# Patient Record
Sex: Male | Born: 1980 | Race: White | Hispanic: No | Smoking: Former smoker
Health system: Southern US, Community
[De-identification: ages and names within clinical notes are randomized; demographics above are authoritative.]

## PROBLEM LIST (undated history)

## (undated) DIAGNOSIS — M109 Gout, unspecified: Secondary | ICD-10-CM

## (undated) DIAGNOSIS — Z8619 Personal history of other infectious and parasitic diseases: Secondary | ICD-10-CM

## (undated) DIAGNOSIS — IMO0002 Reserved for concepts with insufficient information to code with codable children: Secondary | ICD-10-CM

## (undated) DIAGNOSIS — F419 Anxiety disorder, unspecified: Secondary | ICD-10-CM

## (undated) DIAGNOSIS — F32A Depression, unspecified: Secondary | ICD-10-CM

## (undated) DIAGNOSIS — R51 Headache: Secondary | ICD-10-CM

## (undated) DIAGNOSIS — F329 Major depressive disorder, single episode, unspecified: Secondary | ICD-10-CM

## (undated) DIAGNOSIS — G43909 Migraine, unspecified, not intractable, without status migrainosus: Secondary | ICD-10-CM

## (undated) DIAGNOSIS — R519 Headache, unspecified: Secondary | ICD-10-CM

## (undated) HISTORY — DX: Headache: R51

## (undated) HISTORY — DX: Depression, unspecified: F32.A

## (undated) HISTORY — DX: Reserved for concepts with insufficient information to code with codable children: IMO0002

## (undated) HISTORY — DX: Gout, unspecified: M10.9

## (undated) HISTORY — DX: Migraine, unspecified, not intractable, without status migrainosus: G43.909

## (undated) HISTORY — DX: Headache, unspecified: R51.9

## (undated) HISTORY — DX: Personal history of other infectious and parasitic diseases: Z86.19

## (undated) HISTORY — DX: Anxiety disorder, unspecified: F41.9

## (undated) HISTORY — DX: Major depressive disorder, single episode, unspecified: F32.9

---
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
## 2001-11-08 HISTORY — PX: CYSTECTOMY: SUR359

## 2005-11-08 HISTORY — PX: TONSILLECTOMY AND ADENOIDECTOMY: SUR1326

## 2006-03-21 ENCOUNTER — Ambulatory Visit: Payer: Self-pay | Admitting: Unknown Physician Specialty

## 2014-05-21 ENCOUNTER — Encounter: Payer: Self-pay | Admitting: Family Medicine

## 2014-05-21 ENCOUNTER — Encounter (INDEPENDENT_AMBULATORY_CARE_PROVIDER_SITE_OTHER): Payer: Self-pay

## 2014-05-21 ENCOUNTER — Ambulatory Visit (INDEPENDENT_AMBULATORY_CARE_PROVIDER_SITE_OTHER)
Admission: RE | Admit: 2014-05-21 | Discharge: 2014-05-21 | Disposition: A | Discharge status: Home / Self Care | Payer: BC Managed Care – PPO | Source: Ambulatory Visit | Attending: Family Medicine | Admitting: Family Medicine

## 2014-05-21 ENCOUNTER — Ambulatory Visit (INDEPENDENT_AMBULATORY_CARE_PROVIDER_SITE_OTHER): Payer: BC Managed Care – PPO | Admitting: Family Medicine

## 2014-05-21 VITALS — BP 122/82 | HR 66 | Temp 97.8°F | Ht 67.0 in | Wt 234.5 lb

## 2014-05-21 DIAGNOSIS — M25579 Pain in unspecified ankle and joints of unspecified foot: Secondary | ICD-10-CM

## 2014-05-21 DIAGNOSIS — F329 Major depressive disorder, single episode, unspecified: Secondary | ICD-10-CM

## 2014-05-21 DIAGNOSIS — F3289 Other specified depressive episodes: Secondary | ICD-10-CM

## 2014-05-21 DIAGNOSIS — M25572 Pain in left ankle and joints of left foot: Secondary | ICD-10-CM

## 2014-05-21 DIAGNOSIS — F32A Depression, unspecified: Secondary | ICD-10-CM

## 2014-05-21 MED ORDER — ESCITALOPRAM OXALATE 20 MG PO TABS: 20.0000 mg | ORAL_TABLET | Freq: Every day | ORAL | Status: DC

## 2014-05-21 NOTE — Progress Notes (Signed)
Pre visit review using our clinic review tool, if applicable. No additional management support is needed unless otherwise documented below in the visit note.  New patient.  I had seen him years ago at another clinic, but not in 3+ years.  To est care here.  Requesting records from [facility].   L ankle and foot pain.  Stepped in a hole and inverted his ankle.  Felt a pop, but could bear weight.  RICE in the meantime.  Now more recently with pain and swelling, but then better somewhat again today.  No other trauma.    H/o depression. Controlled with meds and counseling, doing well.    PMH and SH reviewed  ROS: See HPI, otherwise noncontributory.  Meds, vitals, and allergies reviewed.   GEN: nad, alert and oriented HEENT: mucous membranes moist NECK: supple w/o LA CV: rrr.  no murmur PULM: ctab, no inc wob ABD: soft, +bs SKIN: no acute rash L foot puffy w/o bruising.  NV intact.  TTP inferior to the lat mal and on the dorsal portion of the proximal midfoot.  No weakness but pain with inversion.   5th MT not ttp

## 2014-05-21 NOTE — Patient Instructions (Signed)
Wear your ankle brace along with your boots.  Gradually wean out of them.   ROM exercises at night when not weight bearing.  Ice and elevation as needed.  Take care.

## 2014-05-22 ENCOUNTER — Encounter: Payer: Self-pay | Admitting: Family Medicine

## 2014-05-22 DIAGNOSIS — F329 Major depressive disorder, single episode, unspecified: Secondary | ICD-10-CM | POA: Insufficient documentation

## 2014-05-22 DIAGNOSIS — M25579 Pain in unspecified ankle and joints of unspecified foot: Secondary | ICD-10-CM | POA: Insufficient documentation

## 2014-05-22 NOTE — Assessment & Plan Note (Signed)
Likely sprain.  Felt better, more stable in an ASO.  He has lace up ankle brace at home.  Will use that with boots and f/u prn.  ROM exercises at night.  Possibility of  occult fx d/w pt, he is aware.  I read the films, no fx seen. He agrees with plan.  See instructions.

## 2014-05-22 NOTE — Assessment & Plan Note (Signed)
Controlled with meds and counseling, doing well.

## 2015-01-27 ENCOUNTER — Encounter: Payer: Self-pay | Admitting: Family Medicine

## 2015-01-27 ENCOUNTER — Ambulatory Visit (INDEPENDENT_AMBULATORY_CARE_PROVIDER_SITE_OTHER): Payer: BLUE CROSS/BLUE SHIELD | Admitting: Family Medicine

## 2015-01-27 VITALS — BP 110/82 | HR 72 | Temp 98.4°F | Wt 242.5 lb

## 2015-01-27 DIAGNOSIS — R739 Hyperglycemia, unspecified: Secondary | ICD-10-CM

## 2015-01-27 DIAGNOSIS — Z13220 Encounter for screening for lipoid disorders: Secondary | ICD-10-CM

## 2015-01-27 DIAGNOSIS — Z23 Encounter for immunization: Secondary | ICD-10-CM

## 2015-01-27 DIAGNOSIS — M25579 Pain in unspecified ankle and joints of unspecified foot: Secondary | ICD-10-CM

## 2015-01-27 DIAGNOSIS — F3342 Major depressive disorder, recurrent, in full remission: Secondary | ICD-10-CM

## 2015-01-27 DIAGNOSIS — Z131 Encounter for screening for diabetes mellitus: Secondary | ICD-10-CM

## 2015-01-27 DIAGNOSIS — E785 Hyperlipidemia, unspecified: Secondary | ICD-10-CM

## 2015-01-27 LAB — GLUCOSE, RANDOM: Glucose, Bld: 107 mg/dL — ABNORMAL HIGH (ref 70–99)

## 2015-01-27 LAB — LIPID PANEL
Cholesterol: 203 mg/dL — ABNORMAL HIGH (ref 0–200)
HDL: 36.9 mg/dL — ABNORMAL LOW (ref >39.00)
NonHDL: 166.1
Total CHOL/HDL Ratio: 6
Triglycerides: 231 mg/dL — ABNORMAL HIGH (ref 0.0–149.0)
VLDL: 46.2 mg/dL — ABNORMAL HIGH (ref 0.0–40.0)

## 2015-01-27 LAB — LDL CHOLESTEROL, DIRECT: Direct LDL: 138 mg/dL

## 2015-01-27 MED ORDER — LORAZEPAM 0.5 MG PO TABS: 0.5000 mg | ORAL_TABLET | Freq: Two times a day (BID) | ORAL | Status: DC | PRN

## 2015-01-27 MED ORDER — ESCITALOPRAM OXALATE 20 MG PO TABS: 20.0000 mg | ORAL_TABLET | Freq: Every day | ORAL | Status: DC

## 2015-01-27 NOTE — Patient Instructions (Addendum)
Try getting back on the bike gradually and keep stretching/icing your feet.  Go to the lab on the way out.  We'll contact you with your lab report. Take care.  Glad to see you.

## 2015-01-27 NOTE — Progress Notes (Signed)
Pre visit review using our clinic review tool, if applicable. No additional management support is needed unless otherwise documented below in the visit note.  Promoted at work, inc in stress noted.  He still has regular hours at work and that helps. Still on lexapro with prn BZD as needed, not frequently.  Mood is good overall, good effect from SSRI.  Dec in sex drive noted, but tolerable.  Sleeping well.  No SI/HI.    Exercise is limited, d/w pt, "viscous cycle" of weight and foot troubles = plantar fasciitis.  He can also get some dorsal arch pain.  Diet is still good, he isn't stress eating.  He has arch supports in his shoes.     Due for tdap and sugar/lipid screening.  D/w pt.   Meds, vitals, and allergies reviewed.   ROS: See HPI.  Otherwise, noncontributory.  nad ncat Neck supple. No LA rrr ctab abd soft Ext w/o edema B feet with normal inspection, normal DP pulse B, plantar fascia not ttp. No sig loss or arch B

## 2015-01-28 ENCOUNTER — Encounter: Payer: Self-pay | Admitting: Family Medicine

## 2015-01-28 DIAGNOSIS — R739 Hyperglycemia, unspecified: Secondary | ICD-10-CM | POA: Insufficient documentation

## 2015-01-28 DIAGNOSIS — E785 Hyperlipidemia, unspecified: Secondary | ICD-10-CM | POA: Insufficient documentation

## 2015-01-28 NOTE — Assessment & Plan Note (Signed)
See notes on labs.  D/w pt about diet and exercise.  

## 2015-01-28 NOTE — Assessment & Plan Note (Signed)
D/w pt about getting back on bike gradually and routinely stretching his feet.  He has good shoes and arch supports.  We can get a second opinion if needed.  He'll update me as needed.

## 2015-01-28 NOTE — Assessment & Plan Note (Signed)
See notes on labs.  D/w pt about diet and exercise.

## 2015-01-28 NOTE — Assessment & Plan Note (Addendum)
Controlled, continue as is.  No change in med.  He agrees.  Okay for outpatient fu.  >25 minutes spent in face to face time with patient, >50% spent in counselling or coordination of care.

## 2015-09-29 ENCOUNTER — Telehealth: Payer: Self-pay | Admitting: Family Medicine

## 2015-09-30 ENCOUNTER — Other Ambulatory Visit: Payer: Self-pay | Admitting: Family Medicine

## 2015-09-30 NOTE — Telephone Encounter (Signed)
Electronic refill request. Last Filled:    30 tablet 1 01/27/2015  Last office visit:  01/27/15       Please advise.

## 2015-10-01 NOTE — Telephone Encounter (Signed)
Labs 1/30 [facility] 2/2 Pt aware  Please close

## 2015-10-01 NOTE — Telephone Encounter (Signed)
Rx called to pharmacy as instructed.  Please schedule appointment as instructed.

## 2015-10-01 NOTE — Telephone Encounter (Signed)
Please call in.  Due for f/u in spring 2017.   Thanks.

## 2015-12-07 ENCOUNTER — Other Ambulatory Visit: Payer: Self-pay | Admitting: Family Medicine

## 2015-12-07 DIAGNOSIS — E785 Hyperlipidemia, unspecified: Secondary | ICD-10-CM

## 2015-12-08 ENCOUNTER — Other Ambulatory Visit (INDEPENDENT_AMBULATORY_CARE_PROVIDER_SITE_OTHER): Payer: BLUE CROSS/BLUE SHIELD

## 2015-12-08 DIAGNOSIS — E785 Hyperlipidemia, unspecified: Secondary | ICD-10-CM | POA: Diagnosis not present

## 2015-12-08 LAB — LIPID PANEL
Cholesterol: 173 mg/dL (ref 0–200)
HDL: 38.1 mg/dL — ABNORMAL LOW (ref >39.00)
NonHDL: 134.53
Total CHOL/HDL Ratio: 5
Triglycerides: 211 mg/dL — ABNORMAL HIGH (ref 0.0–149.0)
VLDL: 42.2 mg/dL — ABNORMAL HIGH (ref 0.0–40.0)

## 2015-12-08 LAB — BASIC METABOLIC PANEL
BUN: 13 mg/dL (ref 6–23)
CO2: 28 mEq/L (ref 19–32)
Calcium: 9.1 mg/dL (ref 8.4–10.5)
Chloride: 104 mEq/L (ref 96–112)
Creatinine, Ser: 1.02 mg/dL (ref 0.40–1.50)
GFR: 87.86 mL/min (ref >60.00)
Glucose, Bld: 105 mg/dL — ABNORMAL HIGH (ref 70–99)
Potassium: 4.4 mEq/L (ref 3.5–5.1)
Sodium: 140 mEq/L (ref 135–145)

## 2015-12-08 LAB — LDL CHOLESTEROL, DIRECT: Direct LDL: 107 mg/dL

## 2015-12-11 ENCOUNTER — Encounter: Payer: BLUE CROSS/BLUE SHIELD | Admitting: Family Medicine

## 2015-12-12 ENCOUNTER — Encounter: Payer: Self-pay | Admitting: Family Medicine

## 2015-12-12 ENCOUNTER — Ambulatory Visit (INDEPENDENT_AMBULATORY_CARE_PROVIDER_SITE_OTHER): Payer: BLUE CROSS/BLUE SHIELD | Admitting: Family Medicine

## 2015-12-12 VITALS — BP 126/88 | HR 65 | Temp 97.7°F | Ht 67.0 in | Wt 237.2 lb

## 2015-12-12 DIAGNOSIS — Z0000 Encounter for general adult medical examination without abnormal findings: Secondary | ICD-10-CM

## 2015-12-12 DIAGNOSIS — Z7189 Other specified counseling: Secondary | ICD-10-CM

## 2015-12-12 DIAGNOSIS — F339 Major depressive disorder, recurrent, unspecified: Secondary | ICD-10-CM

## 2015-12-12 MED ORDER — ESCITALOPRAM OXALATE 20 MG PO TABS: 20.0000 mg | ORAL_TABLET | Freq: Every day | ORAL | Status: DC

## 2015-12-12 NOTE — Progress Notes (Signed)
Pre visit review using our clinic review tool, if applicable. No additional management support is needed unless otherwise documented below in the visit note.  CPE- See plan.  Routine anticipatory guidance given to patient.  See health maintenance. Tetanus 2016 Flu shot encouraged.  He has some URI sx and wanted to wait a few days.   PNA and shingles not due.   Colon and prostate cancer not due.   Living will d/w pt.  Brandon Jackson designated if patient were incapacitated.   Diet and exercise d/w pt. Doing well on diet, less well on exercise, encouraged.  He is walking some.   Work is busy.   HIV prev done ~2008.    He wanted to go back to counseling.  D/w pt.  No SI/HI.  Still on lexapro.  Chronic work and life stressors note.    PMH and SH reviewed  Meds, vitals, and allergies reviewed.   ROS: See HPI.  Otherwise negative.    GEN: nad, alert and oriented HEENT: mucous membranes moist NECK: supple w/o LA CV: rrr. PULM: ctab, no inc wob ABD: soft, +bs EXT: no edema SKIN: no acute rash

## 2015-12-12 NOTE — Patient Instructions (Signed)
Don't change your meds.  Check on counseling.  Let me know if you need a referral or have trouble picking one out.  Take care.  Glad to see you.

## 2015-12-14 DIAGNOSIS — Z0000 Encounter for general adult medical examination without abnormal findings: Secondary | ICD-10-CM | POA: Insufficient documentation

## 2015-12-14 DIAGNOSIS — Z7189 Other specified counseling: Secondary | ICD-10-CM | POA: Insufficient documentation

## 2015-12-14 NOTE — Assessment & Plan Note (Signed)
He'll get back in counseling.  He'll check on list of in network counselors and update me as needed, if he can't decide who to see.  Okay for outpatient f/u.  No change in meds.

## 2015-12-14 NOTE — Assessment & Plan Note (Addendum)
Tetanus 2016  Flu shot encouraged. He has some URI sx and wanted to wait a few days.  PNA and shingles not due.  Colon and prostate cancer not due.  Living will d/w pt. Brandon Jackson designated if patient were incapacitated.  Diet and exercise d/w pt. Doing well on diet, less well on exercise, encouraged. He is walking some.  Work is busy.  HIV prev done ~2008.  He'll work on sugar and lipids with diet and exercise, but addressing mood is likely a bigger deal for patient now, see below.   Labs d/w pt.

## 2016-06-10 DIAGNOSIS — M4607 Spinal enthesopathy, lumbosacral region: Secondary | ICD-10-CM | POA: Diagnosis not present

## 2016-06-10 DIAGNOSIS — M9903 Segmental and somatic dysfunction of lumbar region: Secondary | ICD-10-CM | POA: Diagnosis not present

## 2016-06-10 DIAGNOSIS — M9904 Segmental and somatic dysfunction of sacral region: Secondary | ICD-10-CM | POA: Diagnosis not present

## 2016-06-10 DIAGNOSIS — M62830 Muscle spasm of back: Secondary | ICD-10-CM | POA: Diagnosis not present

## 2016-06-17 DIAGNOSIS — M4607 Spinal enthesopathy, lumbosacral region: Secondary | ICD-10-CM | POA: Diagnosis not present

## 2016-06-17 DIAGNOSIS — M9903 Segmental and somatic dysfunction of lumbar region: Secondary | ICD-10-CM | POA: Diagnosis not present

## 2016-06-17 DIAGNOSIS — M62830 Muscle spasm of back: Secondary | ICD-10-CM | POA: Diagnosis not present

## 2016-06-17 DIAGNOSIS — M9904 Segmental and somatic dysfunction of sacral region: Secondary | ICD-10-CM | POA: Diagnosis not present

## 2016-06-24 ENCOUNTER — Encounter: Payer: Self-pay | Admitting: Family Medicine

## 2016-06-24 ENCOUNTER — Other Ambulatory Visit: Payer: Self-pay | Admitting: *Deleted

## 2016-06-24 MED ORDER — LORAZEPAM 0.5 MG PO TABS: 0.5000 mg | ORAL_TABLET | Freq: Two times a day (BID) | ORAL | 2 refills | Status: DC | PRN

## 2016-06-24 NOTE — Telephone Encounter (Signed)
MyChart RF request.  Last Filled:     30 tablet 2 10/01/2015  Last office visit:   12/12/2015   Please advise.

## 2016-06-24 NOTE — Telephone Encounter (Signed)
Please call in.  Thanks.   

## 2016-06-25 ENCOUNTER — Other Ambulatory Visit: Payer: Self-pay | Admitting: Family Medicine

## 2016-06-25 NOTE — Telephone Encounter (Signed)
Medication phoned to pharmacy.  

## 2016-08-18 DIAGNOSIS — H5213 Myopia, bilateral: Secondary | ICD-10-CM | POA: Diagnosis not present

## 2016-12-11 ENCOUNTER — Other Ambulatory Visit: Payer: Self-pay | Admitting: Family Medicine

## 2017-01-18 ENCOUNTER — Encounter: Payer: BLUE CROSS/BLUE SHIELD | Admitting: Family Medicine

## 2017-01-24 ENCOUNTER — Ambulatory Visit (INDEPENDENT_AMBULATORY_CARE_PROVIDER_SITE_OTHER): Payer: BLUE CROSS/BLUE SHIELD | Admitting: Family Medicine

## 2017-01-24 ENCOUNTER — Encounter: Payer: Self-pay | Admitting: Family Medicine

## 2017-01-24 VITALS — BP 120/84 | HR 60 | Temp 98.6°F | Ht 67.0 in | Wt 236.0 lb

## 2017-01-24 DIAGNOSIS — Z0000 Encounter for general adult medical examination without abnormal findings: Secondary | ICD-10-CM

## 2017-01-24 DIAGNOSIS — E785 Hyperlipidemia, unspecified: Secondary | ICD-10-CM | POA: Diagnosis not present

## 2017-01-24 DIAGNOSIS — F339 Major depressive disorder, recurrent, unspecified: Secondary | ICD-10-CM

## 2017-01-24 LAB — BASIC METABOLIC PANEL
BUN: 14 mg/dL (ref 6–23)
CO2: 30 mEq/L (ref 19–32)
Calcium: 9.9 mg/dL (ref 8.4–10.5)
Chloride: 102 mEq/L (ref 96–112)
Creatinine, Ser: 0.97 mg/dL (ref 0.40–1.50)
GFR: 92.53 mL/min (ref >60.00)
Glucose, Bld: 96 mg/dL (ref 70–99)
Potassium: 4.2 mEq/L (ref 3.5–5.1)
Sodium: 140 mEq/L (ref 135–145)

## 2017-01-24 LAB — LIPID PANEL
Cholesterol: 188 mg/dL (ref 0–200)
HDL: 43.8 mg/dL (ref >39.00)
LDL Cholesterol: 113 mg/dL — ABNORMAL HIGH (ref 0–99)
NonHDL: 143.92
Total CHOL/HDL Ratio: 4
Triglycerides: 156 mg/dL — ABNORMAL HIGH (ref 0.0–149.0)
VLDL: 31.2 mg/dL (ref 0.0–40.0)

## 2017-01-24 MED ORDER — ESCITALOPRAM OXALATE 20 MG PO TABS: 20.0000 mg | ORAL_TABLET | Freq: Every day | ORAL | 12 refills | Status: DC

## 2017-01-24 MED ORDER — LORAZEPAM 0.5 MG PO TABS: 0.5000 mg | ORAL_TABLET | Freq: Two times a day (BID) | ORAL | 2 refills | Status: DC | PRN

## 2017-01-24 NOTE — Progress Notes (Signed)
CPE- See plan.  Routine anticipatory guidance given to patient.  See health maintenance.  The possibility exists that previously documented standard health maintenance information may have been brought forward from a previous encounter into this note.  If needed, that same information has been updated to reflect the current situation based on today's encounter.    Tetanus 2016  Flu shot encouraged, d/w pt.   PNA and shingles not due.  Colon and prostate cancer not due.  Living will d/w pt. Brandon Jackson designated if patient were incapacitated.  Diet and exercise d/w pt. "Not great."  He cut back on carbs some in the meantime.  He cut out fast food.  Exercise is lagging.  D/w pt about options.   Work is busy. D/w pt.   HIV prev done ~2008.  Labs pending. D/w pt.    Mood is not good per patient report.  He had a worse winter, his sx are worse in the wintertime at baseline, but this was more pronounced.  More irritable.  No SI/HI.  He prev went to counseling in the past and wanted to get started with counseling again.  It was prev helpful.  Still on lexapro.  Rare use of BZD.    Father with h/o likely IBS.  Patient now with similar.  Seems to be w/o clear food trigger.  Diarrheal tendency.  No blood in stool.    PMH and SH reviewed  Meds, vitals, and allergies reviewed.   ROS: Per HPI.  Unless specifically indicated otherwise in HPI, the patient denies:  General: fever. Eyes: acute vision changes ENT: sore throat Cardiovascular: chest pain Respiratory: SOB GI: vomiting GU: dysuria Musculoskeletal: acute back pain Derm: acute rash Neuro: acute motor dysfunction Psych: worsening mood Endocrine: polydipsia Heme: bleeding Allergy: hayfever  GEN: nad, alert and oriented HEENT: mucous membranes moist NECK: supple w/o LA CV: rrr. PULM: ctab, no inc wob ABD: soft, +bs EXT: no edema SKIN: no acute rash

## 2017-01-24 NOTE — Progress Notes (Signed)
Pre visit review using our clinic review tool, if applicable. No additional management support is needed unless otherwise documented below in the visit note. 

## 2017-01-24 NOTE — Patient Instructions (Addendum)
Go to the lab on the way out.  We'll contact you with your lab report. Check on the way out about your referral. Don't change your meds for now.   Take care.  Glad to see you.  Update me as needed.

## 2017-01-25 ENCOUNTER — Encounter: Payer: Self-pay | Admitting: *Deleted

## 2017-01-25 NOTE — Assessment & Plan Note (Signed)
Tetanus 2016  Flu shot encouraged, d/w pt.   PNA and shingles not due.  Colon and prostate cancer not due.  Living will d/w pt. Brandon Jackson designated if patient were incapacitated.  Diet and exercise d/w pt. "Not great."  He cut back on carbs some in the meantime.  He cut out fast food.  Exercise is lagging.  D/w pt about options.   Work is busy. D/w pt.   HIV prev done ~2008.  Labs pending. D/w pt.

## 2017-01-25 NOTE — Assessment & Plan Note (Addendum)
Continue current medication. No suicidal or homicidal intent. Okay for outpatient follow-up. Refer for counseling. I think this would be beneficial. He agrees. He will update me if his mood is worse. He contracts for safety.  It may be the case that he has IBS or at least IBS-like symptoms. In the absence of any red flag symptoms, and he has no red flag symptoms, it would be likely most appropriate to treat his mood first and see what symptoms remain after that. He agrees.  Discussed with patient about diet and exercise in general. He agrees.

## 2017-02-16 ENCOUNTER — Encounter: Payer: Self-pay | Admitting: Family Medicine

## 2017-03-16 ENCOUNTER — Ambulatory Visit (INDEPENDENT_AMBULATORY_CARE_PROVIDER_SITE_OTHER): Payer: BLUE CROSS/BLUE SHIELD | Admitting: Psychology

## 2017-03-16 DIAGNOSIS — F331 Major depressive disorder, recurrent, moderate: Secondary | ICD-10-CM | POA: Diagnosis not present

## 2017-03-16 DIAGNOSIS — F411 Generalized anxiety disorder: Secondary | ICD-10-CM

## 2017-03-31 ENCOUNTER — Ambulatory Visit: Payer: BLUE CROSS/BLUE SHIELD | Admitting: Psychology

## 2017-03-31 ENCOUNTER — Ambulatory Visit (INDEPENDENT_AMBULATORY_CARE_PROVIDER_SITE_OTHER): Payer: BLUE CROSS/BLUE SHIELD | Admitting: Psychology

## 2017-03-31 DIAGNOSIS — F331 Major depressive disorder, recurrent, moderate: Secondary | ICD-10-CM

## 2017-03-31 DIAGNOSIS — F411 Generalized anxiety disorder: Secondary | ICD-10-CM | POA: Diagnosis not present

## 2017-04-06 ENCOUNTER — Ambulatory Visit (INDEPENDENT_AMBULATORY_CARE_PROVIDER_SITE_OTHER): Payer: BLUE CROSS/BLUE SHIELD | Admitting: Psychology

## 2017-04-06 DIAGNOSIS — F411 Generalized anxiety disorder: Secondary | ICD-10-CM | POA: Diagnosis not present

## 2017-04-06 DIAGNOSIS — F331 Major depressive disorder, recurrent, moderate: Secondary | ICD-10-CM

## 2017-04-15 ENCOUNTER — Ambulatory Visit (INDEPENDENT_AMBULATORY_CARE_PROVIDER_SITE_OTHER): Payer: BLUE CROSS/BLUE SHIELD | Admitting: Psychology

## 2017-04-15 DIAGNOSIS — F331 Major depressive disorder, recurrent, moderate: Secondary | ICD-10-CM | POA: Diagnosis not present

## 2017-04-15 DIAGNOSIS — F411 Generalized anxiety disorder: Secondary | ICD-10-CM

## 2017-04-25 ENCOUNTER — Ambulatory Visit (INDEPENDENT_AMBULATORY_CARE_PROVIDER_SITE_OTHER): Payer: BLUE CROSS/BLUE SHIELD | Admitting: Psychology

## 2017-04-25 DIAGNOSIS — F411 Generalized anxiety disorder: Secondary | ICD-10-CM | POA: Diagnosis not present

## 2017-04-25 DIAGNOSIS — F331 Major depressive disorder, recurrent, moderate: Secondary | ICD-10-CM | POA: Diagnosis not present

## 2017-09-27 ENCOUNTER — Ambulatory Visit (INDEPENDENT_AMBULATORY_CARE_PROVIDER_SITE_OTHER): Payer: BLUE CROSS/BLUE SHIELD | Admitting: Psychology

## 2017-09-27 DIAGNOSIS — F411 Generalized anxiety disorder: Secondary | ICD-10-CM

## 2017-09-27 DIAGNOSIS — F331 Major depressive disorder, recurrent, moderate: Secondary | ICD-10-CM

## 2017-09-28 ENCOUNTER — Encounter: Payer: Self-pay | Admitting: Family Medicine

## 2017-09-28 ENCOUNTER — Ambulatory Visit: Payer: BLUE CROSS/BLUE SHIELD | Admitting: Family Medicine

## 2017-09-28 ENCOUNTER — Telehealth: Payer: Self-pay

## 2017-09-28 VITALS — BP 118/74 | Temp 98.3°F | Ht 67.0 in | Wt 243.0 lb

## 2017-09-28 DIAGNOSIS — N452 Orchitis: Secondary | ICD-10-CM

## 2017-09-28 MED ORDER — DOXYCYCLINE HYCLATE 100 MG PO CAPS: 100.0000 mg | ORAL_CAPSULE | Freq: Two times a day (BID) | ORAL | 0 refills | Status: AC

## 2017-09-28 NOTE — Patient Instructions (Signed)
WE NOW OFFER    [facility] FAST TRACK!!!  SAME DAY Appointments for ACUTE CARE  Such as: Sprains, Injuries, cuts, abrasions, rashes, muscle pain, joint pain, back pain Colds, flu, sore throats, headache, allergies, cough, fever  Ear pain, sinus and eye infections Abdominal pain, nausea, vomiting, diarrhea, upset stomach Animal/insect bites  3 Easy Ways to Schedule: Walk-In Scheduling Call in scheduling Mychart Sign-up: [email or web address]         

## 2017-09-28 NOTE — Telephone Encounter (Signed)
On 09/27/17 pt was in sitting position and pulled out a file from filing cabinet and had immediate pain in rt lower abd; shortly after pt was aware pain was in rt groin, testicle area and pain at times would go down rt leg; pt felt like he had been kicked.Now pain with sitting or standing; less pain laying down. Now pain level 6 and is constant and dull achy pain. [facility] or [facility] and pt prefers not to go to ED. Pt said he can safely drive. Pt scheduled appt with Dr Clent RidgesFry today at 11:15 and if pt worsens prior to appt will go to ED. FYI to Dr Clent RidgesFry.

## 2017-09-28 NOTE — Progress Notes (Signed)
   Subjective:    Patient ID: Sallyanne HaversMichael D Drzewiecki, male    DOB: [birth year], 36 y.o.   MRN: [id number]  HPI Here for 2 days of pain around the right testicle. No recent trauma. It started slowly and then got worse over the past 2 days. No fever. No UTI symptoms. No penile DC.    Review of Systems  Constitutional: Negative.   Respiratory: Negative.   Cardiovascular: Negative.   Gastrointestinal: Negative.   Genitourinary: Positive for testicular pain. Negative for difficulty urinating, discharge, dysuria, flank pain, frequency, genital sores, hematuria, scrotal swelling and urgency.       Objective:   Physical Exam  Constitutional: He appears well-developed and well-nourished.  Cardiovascular: Normal rate, regular rhythm, normal heart sounds and intact distal pulses.  Pulmonary/Chest: Effort normal and breath sounds normal. No respiratory distress. He has no wheezes. He has no rales.  Abdominal: Soft. Bowel sounds are normal. He exhibits no distension and no mass. There is no tenderness. There is no rebound and no guarding.  No hernias felt   Genitourinary:  Genitourinary Comments: The right testicle is slightly swollen and very tender. No masses or hernias felt. The left side is normal.           Assessment & Plan:  Orchitis, treat with Doxycycline. Use Ibuprofen and sit in a warm bath prn.  Gershon CraneStephen Fry, MD

## 2017-10-11 ENCOUNTER — Ambulatory Visit: Payer: BLUE CROSS/BLUE SHIELD | Admitting: Psychology

## 2017-10-11 DIAGNOSIS — F411 Generalized anxiety disorder: Secondary | ICD-10-CM | POA: Diagnosis not present

## 2017-10-11 DIAGNOSIS — F331 Major depressive disorder, recurrent, moderate: Secondary | ICD-10-CM | POA: Diagnosis not present

## 2017-11-09 ENCOUNTER — Ambulatory Visit (INDEPENDENT_AMBULATORY_CARE_PROVIDER_SITE_OTHER): Payer: BLUE CROSS/BLUE SHIELD | Admitting: Psychology

## 2017-11-09 DIAGNOSIS — F411 Generalized anxiety disorder: Secondary | ICD-10-CM

## 2017-11-22 ENCOUNTER — Encounter: Payer: Self-pay | Admitting: Family Medicine

## 2017-11-23 ENCOUNTER — Ambulatory Visit (INDEPENDENT_AMBULATORY_CARE_PROVIDER_SITE_OTHER): Payer: BLUE CROSS/BLUE SHIELD | Admitting: Psychology

## 2017-11-23 DIAGNOSIS — F411 Generalized anxiety disorder: Secondary | ICD-10-CM

## 2017-11-23 DIAGNOSIS — F331 Major depressive disorder, recurrent, moderate: Secondary | ICD-10-CM

## 2017-11-23 MED ORDER — LORAZEPAM 0.5 MG PO TABS: 0.5000 mg | ORAL_TABLET | Freq: Two times a day (BID) | ORAL | 2 refills | Status: DC | PRN

## 2017-11-23 NOTE — Telephone Encounter (Signed)
Message sent to patient thru mychart to call the office and schedule CPE in springtime per Dr. Para Marchuncan.

## 2017-11-23 NOTE — Telephone Encounter (Signed)
rx sent.  Reasonable for CPE in the springtime.  Thanks.

## 2017-12-07 ENCOUNTER — Ambulatory Visit (INDEPENDENT_AMBULATORY_CARE_PROVIDER_SITE_OTHER): Payer: BLUE CROSS/BLUE SHIELD | Admitting: Psychology

## 2017-12-07 DIAGNOSIS — F411 Generalized anxiety disorder: Secondary | ICD-10-CM | POA: Diagnosis not present

## 2017-12-21 ENCOUNTER — Ambulatory Visit (INDEPENDENT_AMBULATORY_CARE_PROVIDER_SITE_OTHER): Payer: BLUE CROSS/BLUE SHIELD | Admitting: Psychology

## 2017-12-21 DIAGNOSIS — F411 Generalized anxiety disorder: Secondary | ICD-10-CM | POA: Diagnosis not present

## 2018-01-04 ENCOUNTER — Ambulatory Visit (INDEPENDENT_AMBULATORY_CARE_PROVIDER_SITE_OTHER): Payer: BLUE CROSS/BLUE SHIELD | Admitting: Psychology

## 2018-01-04 DIAGNOSIS — F411 Generalized anxiety disorder: Secondary | ICD-10-CM

## 2018-01-04 DIAGNOSIS — F331 Major depressive disorder, recurrent, moderate: Secondary | ICD-10-CM

## 2018-01-18 ENCOUNTER — Ambulatory Visit (INDEPENDENT_AMBULATORY_CARE_PROVIDER_SITE_OTHER): Payer: BLUE CROSS/BLUE SHIELD | Admitting: Psychology

## 2018-01-18 DIAGNOSIS — F411 Generalized anxiety disorder: Secondary | ICD-10-CM | POA: Diagnosis not present

## 2018-01-18 DIAGNOSIS — F331 Major depressive disorder, recurrent, moderate: Secondary | ICD-10-CM

## 2018-01-24 ENCOUNTER — Other Ambulatory Visit: Payer: Self-pay | Admitting: Family Medicine

## 2018-01-24 ENCOUNTER — Other Ambulatory Visit: Payer: BLUE CROSS/BLUE SHIELD

## 2018-01-24 ENCOUNTER — Other Ambulatory Visit (INDEPENDENT_AMBULATORY_CARE_PROVIDER_SITE_OTHER): Payer: BLUE CROSS/BLUE SHIELD

## 2018-01-24 DIAGNOSIS — E785 Hyperlipidemia, unspecified: Secondary | ICD-10-CM | POA: Diagnosis not present

## 2018-01-24 LAB — LIPID PANEL
Cholesterol: 171 mg/dL (ref 0–200)
HDL: 37 mg/dL — ABNORMAL LOW (ref >39.00)
LDL Cholesterol: 107 mg/dL — ABNORMAL HIGH (ref 0–99)
NonHDL: 133.87
Total CHOL/HDL Ratio: 5
Triglycerides: 134 mg/dL (ref 0.0–149.0)
VLDL: 26.8 mg/dL (ref 0.0–40.0)

## 2018-01-24 LAB — BASIC METABOLIC PANEL
BUN: 15 mg/dL (ref 6–23)
CO2: 25 mEq/L (ref 19–32)
Calcium: 9.4 mg/dL (ref 8.4–10.5)
Chloride: 102 mEq/L (ref 96–112)
Creatinine, Ser: 0.98 mg/dL (ref 0.40–1.50)
GFR: 90.95 mL/min (ref >60.00)
Glucose, Bld: 107 mg/dL — ABNORMAL HIGH (ref 70–99)
Potassium: 4.1 mEq/L (ref 3.5–5.1)
Sodium: 136 mEq/L (ref 135–145)

## 2018-01-26 ENCOUNTER — Ambulatory Visit (INDEPENDENT_AMBULATORY_CARE_PROVIDER_SITE_OTHER): Payer: BLUE CROSS/BLUE SHIELD | Admitting: Family Medicine

## 2018-01-26 ENCOUNTER — Encounter: Payer: Self-pay | Admitting: Family Medicine

## 2018-01-26 VITALS — BP 110/86 | HR 77 | Temp 98.3°F | Ht 67.0 in | Wt 249.5 lb

## 2018-01-26 DIAGNOSIS — Z0000 Encounter for general adult medical examination without abnormal findings: Secondary | ICD-10-CM

## 2018-01-26 DIAGNOSIS — R739 Hyperglycemia, unspecified: Secondary | ICD-10-CM

## 2018-01-26 DIAGNOSIS — Z7189 Other specified counseling: Secondary | ICD-10-CM

## 2018-01-26 DIAGNOSIS — E785 Hyperlipidemia, unspecified: Secondary | ICD-10-CM

## 2018-01-26 DIAGNOSIS — N529 Male erectile dysfunction, unspecified: Secondary | ICD-10-CM

## 2018-01-26 DIAGNOSIS — F339 Major depressive disorder, recurrent, unspecified: Secondary | ICD-10-CM

## 2018-01-26 MED ORDER — ESCITALOPRAM OXALATE 20 MG PO TABS: 20.0000 mg | ORAL_TABLET | Freq: Every day | ORAL | 12 refills | Status: DC

## 2018-01-26 MED ORDER — SILDENAFIL CITRATE 20 MG PO TABS: 60.0000 mg | ORAL_TABLET | Freq: Every day | ORAL | 12 refills | Status: DC | PRN

## 2018-01-26 NOTE — Progress Notes (Signed)
CPE- See plan.  Routine anticipatory guidance given to patient.  See health maintenance.  The possibility exists that previously documented standard health maintenance information may have been brought forward from a previous encounter into this note.  If needed, that same information has been updated to reflect the current situation based on today's encounter.    Tetanus 2016  Flu shot done 2018.  PNA and shingles not due.  Colon and prostate cancer not due.  Living will d/w pt.  Brandon Jackson designated if patient were incapacitated.  Diet and exercise d/w pt. Still "not great."  His diet is healthy, but portion control was an issue.  He cut out fast food.  Exercise is lagging.  D/w pt about options, walking.   Work is busy. D/w pt about work/life balance.  HIV prev done ~2008.  Labs d/w pt.  Mild hyperglycemia noted.   He cut out diet soda and his HA got better.    He is back in counseling re: his mood.  He is busy with work.  He had a friend from college who committed suicide in the fall so he restarted with counseling.  Still on baseline meds w/o ADE except for ED noted.  No SI/HI.  Taking BZD as needed, up to a few doses per week, often less.    Routine ED tx d/w pt.  All questions answered.  See plan and AVS.   He stress eats and that affects his weight.  He feels some guilt with eating, discussed.  He is back to trying to regulate his meals, getting breakfast, etc.   PMH and SH reviewed  Meds, vitals, and allergies reviewed.   ROS: Per HPI.  Unless specifically indicated otherwise in HPI, the patient denies:  General: fever. Eyes: acute vision changes ENT: sore throat Cardiovascular: chest pain Respiratory: SOB GI: vomiting GU: dysuria Musculoskeletal: acute back pain Derm: acute rash Neuro: acute motor dysfunction Psych: worsening mood Endocrine: polydipsia Heme: bleeding Allergy: hayfever  GEN: nad, alert and oriented HEENT: mucous membranes moist NECK: supple w/o  LA CV: rrr. PULM: ctab, no inc wob ABD: soft, +bs EXT: no edema SKIN: no acute rash

## 2018-01-26 NOTE — Patient Instructions (Signed)
Thanks for getting a flu shot.  Keep working with counseling.   Use sildenafil if needed.  We may need to taper your lexapro or cut the dose.  Take care.  Glad to see you.  Update me as needed.

## 2018-01-27 DIAGNOSIS — N529 Male erectile dysfunction, unspecified: Secondary | ICD-10-CM | POA: Insufficient documentation

## 2018-01-27 NOTE — Assessment & Plan Note (Signed)
Discussed with patient about labs, diet, exercise. 

## 2018-01-27 NOTE — Assessment & Plan Note (Signed)
Tetanus 2016  Flu shot done 2018.  PNA and shingles not due.  Colon and prostate cancer not due.  Living will d/w pt.  Brandon Jackson designated if patient were incapacitated.  Diet and exercise d/w pt. Still "not great."  His diet is healthy, but portion control was an issue.  He cut out fast food.  Exercise is lagging.  D/w pt about options, walking.   Work is busy. D/w pt about work/life balance.  HIV prev done ~2008.  Labs d/w pt.  Mild hyperglycemia noted.

## 2018-01-27 NOTE — Assessment & Plan Note (Signed)
Continue current medications.  Continue with counseling.  I want him to talk about his relationship with food at counseling.  He agrees to do this.  He may have erectile dysfunction related to SSRI use.  At this point it is likely the benefit of SSRI use outweighs any potential adverse events.  Continue as is for now but we may need to taper this in the future if his erectile dysfunction continues.  Discussed with patient.  He agrees.  Okay for outpatient follow-up.

## 2018-01-27 NOTE — Assessment & Plan Note (Signed)
Living will d/w pt. Brandon Jackson designated if patient were incapacitated.

## 2018-01-27 NOTE — Assessment & Plan Note (Signed)
Reasonable to try sildenafil with routine cautions given.  See medication list.  All questions answered.  He agrees.

## 2018-02-01 ENCOUNTER — Ambulatory Visit (INDEPENDENT_AMBULATORY_CARE_PROVIDER_SITE_OTHER): Payer: BLUE CROSS/BLUE SHIELD | Admitting: Psychology

## 2018-02-01 DIAGNOSIS — F331 Major depressive disorder, recurrent, moderate: Secondary | ICD-10-CM

## 2018-02-01 DIAGNOSIS — F411 Generalized anxiety disorder: Secondary | ICD-10-CM

## 2018-02-15 ENCOUNTER — Ambulatory Visit (INDEPENDENT_AMBULATORY_CARE_PROVIDER_SITE_OTHER): Payer: BLUE CROSS/BLUE SHIELD | Admitting: Psychology

## 2018-02-15 DIAGNOSIS — F331 Major depressive disorder, recurrent, moderate: Secondary | ICD-10-CM | POA: Diagnosis not present

## 2018-02-15 DIAGNOSIS — F411 Generalized anxiety disorder: Secondary | ICD-10-CM

## 2018-03-01 ENCOUNTER — Ambulatory Visit (INDEPENDENT_AMBULATORY_CARE_PROVIDER_SITE_OTHER): Payer: BLUE CROSS/BLUE SHIELD | Admitting: Psychology

## 2018-03-01 DIAGNOSIS — F331 Major depressive disorder, recurrent, moderate: Secondary | ICD-10-CM

## 2018-03-01 DIAGNOSIS — F411 Generalized anxiety disorder: Secondary | ICD-10-CM | POA: Diagnosis not present

## 2018-03-13 ENCOUNTER — Ambulatory Visit (INDEPENDENT_AMBULATORY_CARE_PROVIDER_SITE_OTHER): Payer: BLUE CROSS/BLUE SHIELD | Admitting: Psychology

## 2018-03-13 DIAGNOSIS — F331 Major depressive disorder, recurrent, moderate: Secondary | ICD-10-CM | POA: Diagnosis not present

## 2018-03-13 DIAGNOSIS — F411 Generalized anxiety disorder: Secondary | ICD-10-CM | POA: Diagnosis not present

## 2018-03-29 ENCOUNTER — Ambulatory Visit (INDEPENDENT_AMBULATORY_CARE_PROVIDER_SITE_OTHER): Payer: BLUE CROSS/BLUE SHIELD | Admitting: Psychology

## 2018-03-29 DIAGNOSIS — F411 Generalized anxiety disorder: Secondary | ICD-10-CM

## 2018-03-29 DIAGNOSIS — F331 Major depressive disorder, recurrent, moderate: Secondary | ICD-10-CM

## 2018-04-18 ENCOUNTER — Ambulatory Visit (INDEPENDENT_AMBULATORY_CARE_PROVIDER_SITE_OTHER): Payer: BLUE CROSS/BLUE SHIELD | Admitting: Psychology

## 2018-04-18 DIAGNOSIS — F411 Generalized anxiety disorder: Secondary | ICD-10-CM | POA: Diagnosis not present

## 2018-04-18 DIAGNOSIS — F331 Major depressive disorder, recurrent, moderate: Secondary | ICD-10-CM

## 2018-05-02 ENCOUNTER — Ambulatory Visit (INDEPENDENT_AMBULATORY_CARE_PROVIDER_SITE_OTHER): Payer: BLUE CROSS/BLUE SHIELD | Admitting: Psychology

## 2018-05-02 DIAGNOSIS — F331 Major depressive disorder, recurrent, moderate: Secondary | ICD-10-CM

## 2018-05-02 DIAGNOSIS — F411 Generalized anxiety disorder: Secondary | ICD-10-CM

## 2018-05-16 ENCOUNTER — Ambulatory Visit (INDEPENDENT_AMBULATORY_CARE_PROVIDER_SITE_OTHER): Payer: BLUE CROSS/BLUE SHIELD | Admitting: Psychology

## 2018-05-16 DIAGNOSIS — F411 Generalized anxiety disorder: Secondary | ICD-10-CM

## 2018-05-16 DIAGNOSIS — F331 Major depressive disorder, recurrent, moderate: Secondary | ICD-10-CM | POA: Diagnosis not present

## 2018-05-22 ENCOUNTER — Ambulatory Visit: Payer: Self-pay | Admitting: Family Medicine

## 2018-06-05 ENCOUNTER — Ambulatory Visit (INDEPENDENT_AMBULATORY_CARE_PROVIDER_SITE_OTHER): Payer: BLUE CROSS/BLUE SHIELD | Admitting: Psychology

## 2018-06-05 DIAGNOSIS — F331 Major depressive disorder, recurrent, moderate: Secondary | ICD-10-CM

## 2018-06-05 DIAGNOSIS — F411 Generalized anxiety disorder: Secondary | ICD-10-CM | POA: Diagnosis not present

## 2018-06-13 ENCOUNTER — Ambulatory Visit (INDEPENDENT_AMBULATORY_CARE_PROVIDER_SITE_OTHER): Payer: BLUE CROSS/BLUE SHIELD | Admitting: Psychology

## 2018-06-13 DIAGNOSIS — F331 Major depressive disorder, recurrent, moderate: Secondary | ICD-10-CM | POA: Diagnosis not present

## 2018-06-13 DIAGNOSIS — F411 Generalized anxiety disorder: Secondary | ICD-10-CM

## 2018-06-15 ENCOUNTER — Encounter: Payer: Self-pay | Admitting: Family Medicine

## 2018-06-15 ENCOUNTER — Ambulatory Visit: Payer: BLUE CROSS/BLUE SHIELD | Admitting: Family Medicine

## 2018-06-15 VITALS — BP 122/78 | HR 88 | Temp 98.5°F | Ht 67.0 in | Wt 244.5 lb

## 2018-06-15 DIAGNOSIS — F339 Major depressive disorder, recurrent, unspecified: Secondary | ICD-10-CM

## 2018-06-15 MED ORDER — LORAZEPAM 0.5 MG PO TABS: 0.5000 mg | ORAL_TABLET | Freq: Two times a day (BID) | ORAL | 2 refills | Status: DC | PRN

## 2018-06-15 MED ORDER — BUPROPION HCL 75 MG PO TABS: 75.0000 mg | ORAL_TABLET | Freq: Every day | ORAL | 1 refills | Status: DC

## 2018-06-15 NOTE — Progress Notes (Signed)
Was seen in 01/2018.  The plan at that point was to not change his meds.  D/w pt.    His work situation is stressful.  He had a noted worsening in his mood in the last month or so.  Fatigue, lack of motivation worse in the meantime.  He has days where he has trouble getting out of bed.  No SI/HI but he has had consideration/thoughts of death.  His counselor asked him to come in for eval.    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  That is a good project to focus on but it has made his anxiety worse, with focusing on the pending presentation.   He has been on SSRI with prn BZD, but BZD is less effective for anxiety recently.    D/w pt about safety, he contracts for safety.    Minimal etoh, no illicit drugs.    Meds, vitals, and allergies reviewed.   ROS: Per HPI unless specifically indicated in ROS section   GEN: nad, alert and oriented HEENT: mucous membranes moist NECK: supple w/o LA CV: rrr. PULM: ctab, no inc wob ABD: soft, +bs EXT: no edema Speech and judgment intact and normal.  He does have a slightly flatter affect.

## 2018-06-15 NOTE — Patient Instructions (Signed)
Try adding back the wellbutrin in the AM and see if that helps/is tolerated.   Update me next week.  Take care.  Glad to see you.

## 2018-06-18 NOTE — Assessment & Plan Note (Signed)
He contracts for safety.  Routine cautions given to patient.  Okay for outpatient follow-up.  We talked about options.  He previously did not tolerate Wellbutrin but that was before he added on Lexapro.  It may be the case that adding Wellbutrin would be reasonable and useful, at least starting that at a low dose.  He'll try adding back the wellbutrin in the AM and see if that helps/is tolerated.   He'll update me next week.   This medication change is likely reasonable and sooner to have benefit then tapering him off Lexapro and changing to another medication such as Effexor.  Continue with counseling.  Continue as needed lorazepam use.  No adverse effect on medications.  All questions answered to the best my ability.  He agrees with plan. >25 minutes spent in face to face time with patient, >50% spent in counselling or coordination of car.

## 2018-06-25 ENCOUNTER — Telehealth: Payer: Self-pay | Admitting: Family Medicine

## 2018-06-25 NOTE — Telephone Encounter (Signed)
I need an update on patient regarding his mood with the med change.  And how did his presentation go?  Let me know.  Thanks.

## 2018-06-26 NOTE — Telephone Encounter (Signed)
Unable to reach patient on home/cell phone.  Message left at work # to return call.  CRM created.

## 2018-06-27 ENCOUNTER — Ambulatory Visit (INDEPENDENT_AMBULATORY_CARE_PROVIDER_SITE_OTHER): Payer: BLUE CROSS/BLUE SHIELD | Admitting: Psychology

## 2018-06-27 DIAGNOSIS — F411 Generalized anxiety disorder: Secondary | ICD-10-CM

## 2018-06-27 DIAGNOSIS — F331 Major depressive disorder, recurrent, moderate: Secondary | ICD-10-CM

## 2018-06-27 NOTE — Telephone Encounter (Signed)
Pt states that he has not noticed any changes with the new medication [facility](WELLBUTRIN). He has been taking for 2 weeks Friday. He states that his presentation went well. Pt states that he will stick with the medication a little bit longer to see what it does. Last week was particularly stressful so maybe this week he'll notice a difference. Pt sends apology for not being able to get to him on his cell #. He does not show missed calls or msgs.

## 2018-06-28 NOTE — Telephone Encounter (Signed)
Noted. Thanks.  I'll await update from patient.

## 2018-07-11 ENCOUNTER — Ambulatory Visit (INDEPENDENT_AMBULATORY_CARE_PROVIDER_SITE_OTHER): Payer: BLUE CROSS/BLUE SHIELD | Admitting: Psychology

## 2018-07-11 DIAGNOSIS — F411 Generalized anxiety disorder: Secondary | ICD-10-CM | POA: Diagnosis not present

## 2018-07-11 DIAGNOSIS — F331 Major depressive disorder, recurrent, moderate: Secondary | ICD-10-CM | POA: Diagnosis not present

## 2018-08-15 ENCOUNTER — Ambulatory Visit (INDEPENDENT_AMBULATORY_CARE_PROVIDER_SITE_OTHER): Payer: BLUE CROSS/BLUE SHIELD | Admitting: Psychology

## 2018-08-15 DIAGNOSIS — F411 Generalized anxiety disorder: Secondary | ICD-10-CM | POA: Diagnosis not present

## 2018-08-15 DIAGNOSIS — F331 Major depressive disorder, recurrent, moderate: Secondary | ICD-10-CM | POA: Diagnosis not present

## 2018-08-17 ENCOUNTER — Ambulatory Visit: Payer: Self-pay | Admitting: Adult Health

## 2018-08-17 ENCOUNTER — Encounter: Payer: Self-pay | Admitting: Adult Health

## 2018-08-17 VITALS — BP 140/82 | HR 99 | Temp 98.5°F | Resp 16 | Ht 67.0 in | Wt 245.0 lb

## 2018-08-17 DIAGNOSIS — L309 Dermatitis, unspecified: Secondary | ICD-10-CM

## 2018-08-17 MED ORDER — DESONIDE 0.05 % EX CREA: TOPICAL_CREAM | Freq: Two times a day (BID) | CUTANEOUS | 0 refills | Status: DC

## 2018-08-17 NOTE — Patient Instructions (Signed)

## 2018-08-17 NOTE — Progress Notes (Addendum)
Subjective:     Patient ID: Brandon Jackson, male   DOB: 1981/08/17, 37 y.o.   MRN: [id number]  HPI  Blood pressure 140/82, pulse 99, temperature 98.5 F (36.9 C), resp. rate 16, height 5\' 7"  (1.702 m), weight 245 lb (111.1 kg), SpO2 99 %.  Patient is a 37 year old male in no acute distress who comes to the clinic with  Dry itching area on right ring finger, Reports dry skin x 1 month. Using Eucerin excercin. He has used hydrocortisone. He reports flares on and off since late august. Symptoms completely resolve and then return.   Denies any injury or trauma. Denies any pain. Denies any insect bites or stings.   Patient  denies any fever, body aches,chills, chest pain, shortness of breath, nausea, vomiting, or diarrhea.   Allergies  Allergen Reactions  . Wellbutrin [Bupropion] Other (See Comments)    Anxiety increased on med.   . Erythromycin Diarrhea and Rash    Review of Systems  Constitutional: Negative.   HENT: Negative.   Respiratory: Negative.   Cardiovascular: Negative.   Genitourinary: Negative.   Musculoskeletal: Negative.   Skin: Positive for color change and rash. Negative for pallor and wound.  Neurological: Negative.   Hematological: Negative.   Psychiatric/Behavioral: Negative.        Objective:   Physical Exam  Constitutional: He is oriented to person, place, and time. He appears well-developed and well-nourished.  HENT:  Head: Normocephalic and atraumatic.  Neck: Normal range of motion. Neck supple.  Cardiovascular: Normal rate, regular rhythm, normal heart sounds and intact distal pulses. Exam reveals no gallop and no friction rub.  No murmur heard. Pulmonary/Chest: Effort normal and breath sounds normal. No stridor. No respiratory distress. He has no wheezes. He has no rales. He exhibits no tenderness.  Abdominal: Soft.  Musculoskeletal: Normal range of motion.       Right wrist: Normal.       Right hand: Normal. He exhibits normal range of motion,  no tenderness, no bony tenderness, normal two-point discrimination, normal capillary refill, no deformity, no laceration and no swelling. Normal sensation noted. Normal strength noted.       Hands: Right ring finger with dry skin and mild pink discoloration of skin underlying dry skin. No drainage, temperature change.  Pruritic.   No pain with palpation of bone. No tenderness.   Neurological: He is alert and oriented to person, place, and time.  Skin: Skin is warm and dry. Capillary refill takes less than 2 seconds. Rash noted. Nails show no clubbing.  See musculoskeletal for description / site   Psychiatric: He has a normal mood and affect. His speech is normal and behavior is normal. Judgment and thought content normal. Cognition and memory are normal.  Vitals reviewed.      Assessment:     Eczema, unspecified type      Plan:     Reviewed after care summary and need for using emolient based lotion.   Meds ordered this encounter  Medications  . desonide (DESOWEN) 0.05 % cream    Sig: Apply topically 2 (two) times daily. Sparingly for 10 days.    Dispense:  30 g    Refill:  0   If persists longer than 2 to 3 weeks or worsens at anytime see Joaquim Nam, MD or return to clinic for evaluation.   Advised patient call the office or your primary care doctor for an appointment if no improvement within 72 hours or  if any symptoms change or worsen at any time  Advised ER or urgent Care if after hours or on weekend. Call 911 for emergency symptoms at any time.Patinet verbalized understanding of all instructions given/reviewed and treatment plan and has no further questions or concerns at this time.    Patient verbalized understanding of all instructions given and denies any further questions at this time.

## 2018-09-14 ENCOUNTER — Encounter: Payer: Self-pay | Admitting: Family Medicine

## 2018-09-14 ENCOUNTER — Telehealth: Payer: Self-pay | Admitting: *Deleted

## 2018-09-14 ENCOUNTER — Ambulatory Visit (INDEPENDENT_AMBULATORY_CARE_PROVIDER_SITE_OTHER)
Admission: RE | Admit: 2018-09-14 | Discharge: 2018-09-14 | Disposition: A | Discharge status: Home / Self Care | Payer: BLUE CROSS/BLUE SHIELD | Source: Ambulatory Visit | Attending: Family Medicine | Admitting: Family Medicine

## 2018-09-14 ENCOUNTER — Ambulatory Visit: Payer: BLUE CROSS/BLUE SHIELD | Admitting: Family Medicine

## 2018-09-14 VITALS — BP 122/86 | HR 82 | Temp 98.4°F | Ht 67.0 in | Wt 244.0 lb

## 2018-09-14 DIAGNOSIS — M533 Sacrococcygeal disorders, not elsewhere classified: Secondary | ICD-10-CM

## 2018-09-14 DIAGNOSIS — F339 Major depressive disorder, recurrent, unspecified: Secondary | ICD-10-CM

## 2018-09-14 NOTE — Telephone Encounter (Signed)
Erroneous encounter

## 2018-09-14 NOTE — Progress Notes (Signed)
Anxiety increased on wellbutrin when taken alone.  He can tolerate wellbutrin when taken with lexparo.  His mood is better, with a noted job change in the meantime.  His employment situation is better.  No SI/HI.  "Life is different now."  He is still in counseling.  Discussed with patient about options.  Likely reasonable to continue as is for now.  Tailbone area pain.  He got a standing desk about 12 months ago.  The pain predates that.  Pain is worse in the meantime.  Pain worse sitting.  Much less pain standing.  Some pain walking.  No skin changes.  No drainage. No FCNAVD.  No leg or bowel sx.  No blood in urine.  No known trauma.  No pain with BM.  Ice and clearly helps the area.  Meds, vitals, and allergies reviewed.   ROS: Per HPI unless specifically indicated in ROS section   nad ncat Affect normal.  Speech normal.  Judgment appears intact. rrr Back not tender to palpation in the midline.  No pilonidal cyst noted.  He does have some tenderness just laterally to the midline on either side superior to his rectum, at the lower portion of the coccyx No hemorrhoid or fissure noted.  No fistula seen.  No drainage. Normal hip range of motion.

## 2018-09-14 NOTE — Patient Instructions (Signed)
Use ice as needed.  Go to the lab on the way out.  We'll contact you with your xray report. Take care.  Glad to see you.

## 2018-09-15 ENCOUNTER — Other Ambulatory Visit: Payer: Self-pay | Admitting: Family Medicine

## 2018-09-15 MED ORDER — CYCLOBENZAPRINE HCL 10 MG PO TABS
5.0000 mg | ORAL_TABLET | Freq: Every evening | ORAL | 0 refills | Status: DC | PRN
Start: 2018-09-15 — End: 2018-10-18

## 2018-09-17 DIAGNOSIS — M533 Sacrococcygeal disorders, not elsewhere classified: Secondary | ICD-10-CM | POA: Insufficient documentation

## 2018-09-17 NOTE — Assessment & Plan Note (Signed)
He is tolerating current medication.  Mood is clearly better.  That could be from the change in medication, the change in job situation, or some combination of both.  Discussed with patient.  Likely reasonable to continue as is for now.  He will update me as needed.

## 2018-09-17 NOTE — Assessment & Plan Note (Signed)
Sacral/coccyx pain.  Discussed with patient about options.  Reasonable to check plain films today.  This does not appear to be a rectal or prostate issue.  He is not systemically ill.  Okay for outpatient follow-up. ==================== No fracture seen on imaging.. Reasonable to try flexeril for the pain. Sedation caution. Would take at night, 5-10mg  per night. rx sent.  Can take with up to 400-600 mg ibuprofen TID with food.  Ice as needed.  If not better with that, then I need to recheck patient.

## 2018-09-26 ENCOUNTER — Ambulatory Visit (INDEPENDENT_AMBULATORY_CARE_PROVIDER_SITE_OTHER): Payer: BLUE CROSS/BLUE SHIELD | Admitting: Psychology

## 2018-09-26 DIAGNOSIS — F331 Major depressive disorder, recurrent, moderate: Secondary | ICD-10-CM | POA: Diagnosis not present

## 2018-09-26 DIAGNOSIS — F411 Generalized anxiety disorder: Secondary | ICD-10-CM | POA: Diagnosis not present

## 2018-10-18 ENCOUNTER — Other Ambulatory Visit: Payer: Self-pay | Admitting: Family Medicine

## 2018-10-18 MED ORDER — CYCLOBENZAPRINE HCL 10 MG PO TABS: 5.0000 mg | ORAL_TABLET | Freq: Every evening | ORAL | 0 refills | Status: DC | PRN

## 2018-10-18 NOTE — Telephone Encounter (Signed)
Electronic refill request Flexeril Last office visit 09/14/18 Last refill 09/15/18 #[phone]

## 2018-10-18 NOTE — Telephone Encounter (Signed)
Sent. Thanks.   

## 2018-10-24 ENCOUNTER — Ambulatory Visit (INDEPENDENT_AMBULATORY_CARE_PROVIDER_SITE_OTHER): Payer: BLUE CROSS/BLUE SHIELD | Admitting: Psychology

## 2018-10-24 DIAGNOSIS — F411 Generalized anxiety disorder: Secondary | ICD-10-CM | POA: Diagnosis not present

## 2018-10-24 DIAGNOSIS — F331 Major depressive disorder, recurrent, moderate: Secondary | ICD-10-CM

## 2018-11-09 DIAGNOSIS — F330 Major depressive disorder, recurrent, mild: Secondary | ICD-10-CM | POA: Diagnosis not present

## 2018-11-09 DIAGNOSIS — F5105 Insomnia due to other mental disorder: Secondary | ICD-10-CM | POA: Diagnosis not present

## 2018-11-09 DIAGNOSIS — F411 Generalized anxiety disorder: Secondary | ICD-10-CM | POA: Diagnosis not present

## 2018-11-22 DIAGNOSIS — F5105 Insomnia due to other mental disorder: Secondary | ICD-10-CM | POA: Diagnosis not present

## 2018-11-22 DIAGNOSIS — F411 Generalized anxiety disorder: Secondary | ICD-10-CM | POA: Diagnosis not present

## 2018-11-22 DIAGNOSIS — F330 Major depressive disorder, recurrent, mild: Secondary | ICD-10-CM | POA: Diagnosis not present

## 2018-11-28 ENCOUNTER — Ambulatory Visit (INDEPENDENT_AMBULATORY_CARE_PROVIDER_SITE_OTHER): Payer: BLUE CROSS/BLUE SHIELD | Admitting: Psychology

## 2018-11-28 DIAGNOSIS — F331 Major depressive disorder, recurrent, moderate: Secondary | ICD-10-CM | POA: Diagnosis not present

## 2018-11-28 DIAGNOSIS — F411 Generalized anxiety disorder: Secondary | ICD-10-CM | POA: Diagnosis not present

## 2019-01-03 DIAGNOSIS — F411 Generalized anxiety disorder: Secondary | ICD-10-CM | POA: Diagnosis not present

## 2019-01-03 DIAGNOSIS — F5105 Insomnia due to other mental disorder: Secondary | ICD-10-CM | POA: Diagnosis not present

## 2019-01-03 DIAGNOSIS — F330 Major depressive disorder, recurrent, mild: Secondary | ICD-10-CM | POA: Diagnosis not present

## 2019-01-08 ENCOUNTER — Ambulatory Visit: Payer: Self-pay | Admitting: Nurse Practitioner

## 2019-01-08 ENCOUNTER — Encounter: Payer: Self-pay | Admitting: Nurse Practitioner

## 2019-01-08 ENCOUNTER — Other Ambulatory Visit: Payer: Self-pay

## 2019-01-08 VITALS — BP 140/84 | HR 105 | Temp 97.8°F | Resp 18 | Wt 239.2 lb

## 2019-01-08 DIAGNOSIS — J029 Acute pharyngitis, unspecified: Secondary | ICD-10-CM

## 2019-01-08 DIAGNOSIS — H65191 Other acute nonsuppurative otitis media, right ear: Secondary | ICD-10-CM

## 2019-01-08 NOTE — Patient Instructions (Addendum)
Sore throat  Other non-recurrent acute nonsuppurative otitis media of right ear  Nice meeting you today Brandon Jackson! Your sore throat is likely from the drainage and you can choose to take over the counter chloraseptic spray, ibuprofen as directed, fexofenadine/zyrtec to help with the drainage if you choose  Encouraged patient to call the office or primary care doctor for an appointment if no improvement in symptoms or if symptoms change or worsen after 72 hours of planned treatment. Patient verbalized understanding of all instructions given/reviewed and has no further questions or concerns at this time.

## 2019-01-08 NOTE — Progress Notes (Signed)
   Subjective:    Patient ID: Brandon Jackson, male    DOB: May 03, 1981, 38 y.o.   MRN: [id number]  HPI Brandon Jackson is here today with complaints of sore throat x2 days. He reports he developed sore throat the night of onset and resolved the next morning, then returned.  He does report possible fluid to his right ear which he feels like he has some pressure.  He admits year-round runny nose which his mother has as well. denies any self treatment.  Denies fever, shortness of breath, wheezing, postnasal drip, or facial pain.  He admits he is now followed by psychiatry for anxiety and depression and doing well on his meds, denies suicidal or homicidal ideations today.   Review of Systems  Constitutional: Negative for activity change, chills, fatigue and fever.  HENT: Positive for rhinorrhea and sore throat. Negative for congestion, ear pain, postnasal drip, sinus pressure and sinus pain.        Right ear pressure  Respiratory: Negative for cough, chest tightness and wheezing.   Cardiovascular: Negative for chest pain.  Gastrointestinal: Negative for diarrhea, nausea and vomiting.  Skin: Negative for rash and wound.       Objective:   Physical Exam Vitals signs reviewed.  Constitutional:      Appearance: Normal appearance. He is well-developed.  HENT:     Head: Normocephalic and atraumatic.     Comments: No maxillary or frontal sinus tenderness    Right Ear: Ear canal normal.     Left Ear: Ear canal normal.     Ears:     Comments: Right not erythematous TM intact with serous fluid left within normal limit limits    Nose: Nose normal.     Mouth/Throat:     Mouth: Mucous membranes are moist.     Pharynx: No pharyngeal swelling, oropharyngeal exudate or posterior oropharyngeal erythema.  Neck:     Musculoskeletal: Normal range of motion and neck supple.  Cardiovascular:     Rate and Rhythm: Normal rate and regular rhythm.     Heart sounds: Normal heart sounds.  Pulmonary:     Effort:  Pulmonary effort is normal. No respiratory distress.     Breath sounds: Normal breath sounds.  Abdominal:     General: Bowel sounds are normal.     Palpations: Abdomen is soft.  Musculoskeletal: Normal range of motion.  Skin:    General: Skin is warm and dry.  Neurological:     Mental Status: He is alert and oriented to person, place, and time.  Psychiatric:        Mood and Affect: Mood normal.           Assessment & Plan:

## 2019-01-23 ENCOUNTER — Ambulatory Visit (INDEPENDENT_AMBULATORY_CARE_PROVIDER_SITE_OTHER): Payer: BLUE CROSS/BLUE SHIELD | Admitting: Psychology

## 2019-01-23 ENCOUNTER — Other Ambulatory Visit: Payer: Self-pay

## 2019-01-23 DIAGNOSIS — F331 Major depressive disorder, recurrent, moderate: Secondary | ICD-10-CM | POA: Diagnosis not present

## 2019-01-23 DIAGNOSIS — F411 Generalized anxiety disorder: Secondary | ICD-10-CM

## 2019-02-26 ENCOUNTER — Telehealth: Payer: Self-pay | Admitting: Registered Nurse

## 2019-02-26 ENCOUNTER — Other Ambulatory Visit: Payer: Self-pay

## 2019-02-26 ENCOUNTER — Other Ambulatory Visit: Payer: Self-pay | Admitting: Registered Nurse

## 2019-02-26 DIAGNOSIS — J00 Acute nasopharyngitis [common cold]: Secondary | ICD-10-CM

## 2019-02-26 DIAGNOSIS — H6983 Other specified disorders of Eustachian tube, bilateral: Secondary | ICD-10-CM

## 2019-02-26 MED ORDER — SALINE SPRAY 0.65 % NA SOLN: 2.0000 | NASAL | 0 refills | Status: DC

## 2019-02-26 MED ORDER — FLUTICASONE PROPIONATE 50 MCG/ACT NA SUSP: 1.0000 | Freq: Two times a day (BID) | NASAL | 2 refills | Status: DC

## 2019-02-26 MED ORDER — CETIRIZINE HCL 10 MG PO TABS: 10.0000 mg | ORAL_TABLET | Freq: Every day | ORAL | 0 refills | Status: DC

## 2019-02-26 NOTE — Patient Instructions (Signed)
Eustachian Tube Dysfunction  Eustachian tube dysfunction refers to a condition in which a blockage develops in the narrow passage that connects the middle ear to the back of the nose (eustachian tube). The eustachian tube regulates air pressure in the middle ear by letting air move between the ear and nose. It also helps to drain fluid from the middle ear space. Eustachian tube dysfunction can affect one or both ears. When the eustachian tube does not function properly, air pressure, fluid, or both can build up in the middle ear. What are the causes? This condition occurs when the eustachian tube becomes blocked or cannot open normally. Common causes of this condition include:  Ear infections.  Colds and other infections that affect the nose, mouth, and throat (upper respiratory tract).  Allergies.  Irritation from cigarette smoke.  Irritation from stomach acid coming up into the esophagus (gastroesophageal reflux). The esophagus is the tube that carries food from the mouth to the stomach.  Sudden changes in air pressure, such as from descending in an airplane or scuba diving.  Abnormal growths in the nose or throat, such as: ? Growths that line the nose (nasal polyps). ? Abnormal growth of cells (tumors). ? Enlarged tissue at the back of the throat (adenoids). What increases the risk? You are more likely to develop this condition if:  You smoke.  You are overweight.  You are a child who has: ? Certain birth defects of the mouth, such as cleft palate. ? Large tonsils or adenoids. What are the signs or symptoms? Common symptoms of this condition include:  A feeling of fullness in the ear.  Ear pain.  Clicking or popping noises in the ear.  Ringing in the ear.  Hearing loss.  Loss of balance.  Dizziness. Symptoms may get worse when the air pressure around you changes, such as when you travel to an area of high elevation, fly on an airplane, or go scuba diving. How is  this diagnosed? This condition may be diagnosed based on:  Your symptoms.  A physical exam of your ears, nose, and throat.  Tests, such as those that measure: ? The movement of your eardrum (tympanogram). ? Your hearing (audiometry). How is this treated? Treatment depends on the cause and severity of your condition.  In mild cases, you may relieve your symptoms by moving air into your ears. This is called "popping the ears."  In more severe cases, or if you have symptoms of fluid in your ears, treatment may include: ? Medicines to relieve congestion (decongestants). ? Medicines that treat allergies (antihistamines). ? Nasal sprays or ear drops that contain medicines that reduce swelling (steroids). ? A procedure to drain the fluid in your eardrum (myringotomy). In this procedure, a small tube is placed in the eardrum to:  Drain the fluid.  Restore the air in the middle ear space. ? A procedure to insert a balloon device through the nose to inflate the opening of the eustachian tube (balloon dilation). Follow these instructions at home: Lifestyle  Do not do any of the following until your health care provider approves: ? Travel to high altitudes. ? Fly in airplanes. ? Work in a pressurized cabin or room. ? Scuba dive.  Do not use any products that contain nicotine or tobacco, such as cigarettes and e-cigarettes. If you need help quitting, ask your health care provider.  Keep your ears dry. Wear fitted earplugs during showering and bathing. Dry your ears completely after. General instructions  Take over-the-counter   and prescription medicines only as told by your health care provider.  Use techniques to help pop your ears as recommended by your health care provider. These may include: ? Chewing gum. ? Yawning. ? Frequent, forceful swallowing. ? Closing your mouth, holding your nose closed, and gently blowing as if you are trying to blow air out of your nose.  Keep all  follow-up visits as told by your health care provider. This is important. Contact a health care provider if:  Your symptoms do not go away after treatment.  Your symptoms come back after treatment.  You are unable to pop your ears.  You have: ? A fever. ? Pain in your ear. ? Pain in your head or neck. ? Fluid draining from your ear.  Your hearing suddenly changes.  You become very dizzy.  You lose your balance. Summary  Eustachian tube dysfunction refers to a condition in which a blockage develops in the eustachian tube.  It can be caused by ear infections, allergies, inhaled irritants, or abnormal growths in the nose or throat.  Symptoms include ear pain, hearing loss, or ringing in the ears.  Mild cases are treated with maneuvers to unblock the ears, such as yawning or ear popping.  Severe cases are treated with medicines. Surgery may also be done (rare). This information is not intended to replace advice given to you by your health care provider. Make sure you discuss any questions you have with your health care provider. Document Released: 11/21/2015 Document Revised: 02/14/2018 Document Reviewed: 02/14/2018 Elsevier Interactive Patient Education  2019 [employer]  How to Perform a Sinus Rinse A sinus rinse is a home treatment that is used to rinse your sinuses with a sterile mixture of salt and water (saline solution). Sinuses are air-filled spaces in your skull behind the bones of your face and forehead that open into your nasal cavity. A sinus rinse can help to clear mucus, dirt, dust, or pollen from your nasal cavity. You may do a sinus rinse when you have a cold, a virus, nasal allergy symptoms, a sinus infection, or stuffiness in your nose or sinuses. Talk with your health care provider about whether a sinus rinse might help you. What are the risks? A sinus rinse is generally safe and effective. However, there are a few risks, which include:  A burning sensation  in your sinuses. This may happen if you do not make the saline solution as directed. Be sure to follow all directions when making the saline solution.  Nasal irritation.  Infection from contaminated water. This is rare, but possible. Do not do a sinus rinse if you have had ear or nasal surgery, ear infection, or blocked ears. Supplies needed:  Saline solution or powder.  Distilled or sterile water may be needed to mix with saline powder. ? You may use boiled and cooled tap water. Boil tap water for 5 minutes; cool until it is lukewarm. Use within 24 hours. ? Do not use regular tap water to mix with the saline solution.  Neti pot or nasal rinse bottle. These supplies release the saline solution into your nose and through your sinuses. Neti pots and nasal rinse bottles can be purchased at [facility] local [facility], a health food store, or online. How to perform a sinus rinse  1. Wash your hands with soap and water. 2. Wash your device according to the directions that came with the product and then dry it. 3. Use the solution that comes with your product  or one that is sold separately in stores. Follow the mixing directions on the package if you need to mix with sterile or distilled water. 4. Fill the device with the amount of saline solution noted in the device instructions. 5. Stand over a sink and tilt your head sideways over the sink. 6. Place the spout of the device in your upper nostril (the one closer to the ceiling). 7. Gently pour or squeeze the saline solution into your nasal cavity. The liquid should drain out from the lower nostril if you are not too congested. 8. While rinsing, breathe through your open mouth. 9. Gently blow your nose to clear any mucus and rinse solution. Blowing too hard may cause ear pain. 10. Repeat in your other nostril. 11. Clean and rinse your device with clean water and then air-dry it. Talk with your health care provider or pharmacist if you have questions  about how to do a sinus rinse. Summary  A sinus rinse is a home treatment that is used to rinse your sinuses with a sterile mixture of salt and water (saline solution).  A sinus rinse is generally safe and effective. Follow all instructions carefully.  Before doing a sinus rinse, talk with your health care provider about whether it would be helpful for you. This information is not intended to replace advice given to you by your health care provider. Make sure you discuss any questions you have with your health care provider. Document Released: 05/22/2014 Document Revised: 08/22/2017 Document Reviewed: 08/22/2017 Elsevier Interactive Patient Education  2019 Elsevier Inc.  Nonallergic Rhinitis Nonallergic rhinitis is a condition that causes symptoms that affect the nose, such as a runny nose and a stuffed-up nose (nasal congestion) that can make it hard to breathe through the nose. This condition is different from having an allergy (allergic rhinitis). Allergic rhinitis occurs when the body's defense system (immune system) reacts to a substance that you are allergic to (allergen), such as pollen, pet dander, mold, or dust. Nonallergic rhinitis has many similar symptoms, but it is not caused by allergens. Nonallergic rhinitis can be a short-term or long-term problem. What are the causes? This condition can be caused by many different things. Some common types of nonallergic rhinitis include: Infectious rhinitis  This is usually due to an infection in the upper respiratory tract. Vasomotor rhinitis  This is the most common type of long-term nonallergic rhinitis.  It is caused by too much blood flow through the nose, which makes the tissue inside of the nose swell.  Symptoms are often triggered by strong odors, cold air, stress, drinking alcohol, cigarette smoke, or changes in the weather. Occupational rhinitis  This type is caused by triggers in the workplace, such as chemicals, dusts,  animal dander, or air pollution. Hormonal rhinitis  This type occurs in women as a result of an increase in the male hormone estrogen.  It may occur during pregnancy, puberty, and menstrual cycles.  Symptoms improve when estrogen levels drop. Drug-induced rhinitis Several drugs can cause nonallergic rhinitis, including:  Medicines that are used to treat high blood pressure, heart disease, and Parkinson disease.  Aspirin and NSAIDs.  Over-the-counter nasal decongestant sprays. These can cause a type of nonallergic rhinitis (rhinitis medicamentosa) when they are used for more than a few days. Nonallergic rhinitis with eosinophilia syndrome (NARES)  This type is caused by having too much of a certain type of white blood cell (eosinophil). Nonallergic rhinitis can also be caused by a reaction to eating hot  or spicy foods. This does not usually cause long-term symptoms. In some cases, the cause of nonallergic rhinitis is not known. What increases the risk? You are more likely to develop this condition if:  You are 32-77 years of age.  You are a woman. Women are twice as likely to have this condition. What are the signs or symptoms? Common symptoms of this condition include:  Nasal congestion.  Runny nose.  The feeling of mucus going down the back of the throat (postnasal drip).  Trouble sleeping at night and daytime sleepiness. Less common symptoms include:  Sneezing.  Coughing.  Itchy nose.  Bloodshot eyes. How is this diagnosed? This condition may be diagnosed based on:  Your symptoms and medical history.  A physical exam.  Allergy testing to rule out allergic rhinitis. You may have skin tests or blood tests. In some cases, the health care provider may take a swab of nasal secretions to look for an increased number of eosinophils. This would be done to confirm a diagnosis of NARES. How is this treated? Treatment for this condition depends on the cause. No single  treatment works for everyone. Work with your health care provider to find the best treatment for you. Treatment may include:  Avoiding the things that trigger your symptoms.  Using medicines to relieve congestion, such as: ? Steroid nasal spray. There are many types. You may need to try a few to find out which one works best. ? Decongestant medicine. This may be an oral medicine or a nasal spray. These medicines are only used for a short time.  Using medicines to relieve a runny nose. These may include antihistamine medicines or anticholinergic nasal sprays.  Surgery to remove tissue from inside the nose may be needed in severe cases if the condition has not improved after 6-12 months of medical treatment. Follow these instructions at home:  Take or use over-the-counter and prescription medicines only as told by your health care provider. Do not stop using your medicine even if you start to feel better.  Use salt-water (saline) rinses or other solutions (nasal washes or irrigations) to wash or rinse out the inside of your nose as told by your health care provider.  Do not take NSAIDs or medicines that contain aspirin if they make your symptoms worse.  Do not drink alcohol if it makes your symptoms worse.  Do not use any tobacco products, such as cigarettes, chewing tobacco, and e-cigarettes. If you need help quitting, ask your health care provider.  Avoid secondhand smoke.  Get some exercise every day. Exercise may help reduce symptoms of nonallergic rhinitis for some people. Ask your health care provider how much exercise and what types of exercise are safe for you.  Sleep with the head of your bed raised (elevated). This may reduce nighttime nasal congestion.  Keep all follow-up visits as told by your health care provider. This is important. Contact a health care provider if:  You have a fever.  Your symptoms are getting worse at home.  Your symptoms are not responding to  medicine.  You develop new symptoms, especially a headache or nosebleed. This information is not intended to replace advice given to you by your health care provider. Make sure you discuss any questions you have with your health care provider. Document Released: 02/16/2016 Document Revised: 04/01/2016 Document Reviewed: 01/15/2016 Elsevier Interactive Patient Education  2019 [employer] Inc. Allergic Rhinitis, Adult Allergic rhinitis is an allergic reaction that affects the mucous membrane inside the  nose. It causes sneezing, a runny or stuffy nose, and the feeling of mucus going down the back of the throat (postnasal drip). Allergic rhinitis can be mild to severe. There are two types of allergic rhinitis:  Seasonal. This type is also called hay fever. It happens only during certain seasons.  Perennial. This type can happen at any time of the year. What are the causes? This condition happens when the body's defense system (immune system) responds to certain harmless substances called allergens as though they were germs.  Seasonal allergic rhinitis is triggered by pollen, which can come from grasses, trees, and weeds. Perennial allergic rhinitis may be caused by:  House dust mites.  Pet dander.  Mold spores. What are the signs or symptoms? Symptoms of this condition include:  Sneezing.  Runny or stuffy nose (nasal congestion).  Postnasal drip.  Itchy nose.  Tearing of the eyes.  Trouble sleeping.  Daytime sleepiness. How is this diagnosed? This condition may be diagnosed based on:  Your medical history.  A physical exam.  Tests to check for related conditions, such as: ? Asthma. ? Pink eye. ? Ear infection. ? Upper respiratory infection.  Tests to find out which allergens trigger your symptoms. These may include skin or blood tests. How is this treated? There is no cure for this condition, but treatment can help control symptoms. Treatment may include:  Taking  medicines that block allergy symptoms, such as antihistamines. Medicine may be given as a shot, nasal spray, or pill.  Avoiding the allergen.  Desensitization. This treatment involves getting ongoing shots until your body becomes less sensitive to the allergen. This treatment may be done if other treatments do not help.  If taking medicine and avoiding the allergen does not work, new, stronger medicines may be prescribed. Follow these instructions at home:  Find out what you are allergic to. Common allergens include smoke, dust, and pollen.  Avoid the things you are allergic to. These are some things you can do to help avoid allergens: ? Replace carpet with wood, tile, or vinyl flooring. Carpet can trap dander and dust. ? Do not smoke. Do not allow smoking in your home. ? Change your heating and air conditioning filter at least once a month. ? During allergy season:  Keep windows closed as much as possible.  Plan outdoor activities when pollen counts are lowest. This is usually during the evening hours.  When coming indoors, change clothing and shower before sitting on furniture or bedding.  Take over-the-counter and prescription medicines only as told by your health care provider.  Keep all follow-up visits as told by your health care provider. This is important. Contact a health care provider if:  You have a fever.  You develop a persistent cough.  You make whistling sounds when you breathe (you wheeze).  Your symptoms interfere with your normal daily activities. Get help right away if:  You have shortness of breath. Summary  This condition can be managed by taking medicines as directed and avoiding allergens.  Contact your health care provider if you develop a persistent cough or fever.  During allergy season, keep windows closed as much as possible. This information is not intended to replace advice given to you by your health care provider. Make sure you discuss any  questions you have with your health care provider. Document Released: 07/20/2001 Document Revised: 12/02/2016 Document Reviewed: 12/02/2016 Elsevier Interactive Patient Education  2019 [employer].

## 2019-02-26 NOTE — Patient Instructions (Signed)
Nonallergic Rhinitis Nonallergic rhinitis is a condition that causes symptoms that affect the nose, such as a runny nose and a stuffed-up nose (nasal congestion) that can make it hard to breathe through the nose. This condition is different from having an allergy (allergic rhinitis). Allergic rhinitis occurs when the body's defense system (immune system) reacts to a substance that you are allergic to (allergen), such as pollen, pet dander, mold, or dust. Nonallergic rhinitis has many similar symptoms, but it is not caused by allergens. Nonallergic rhinitis can be a short-term or long-term problem. What are the causes? This condition can be caused by many different things. Some common types of nonallergic rhinitis include: Infectious rhinitis  This is usually due to an infection in the upper respiratory tract. Vasomotor rhinitis  This is the most common type of long-term nonallergic rhinitis.  It is caused by too much blood flow through the nose, which makes the tissue inside of the nose swell.  Symptoms are often triggered by strong odors, cold air, stress, drinking alcohol, cigarette smoke, or changes in the weather. Occupational rhinitis  This type is caused by triggers in the workplace, such as chemicals, dusts, animal dander, or air pollution. Hormonal rhinitis  This type occurs in women as a result of an increase in the male hormone estrogen.  It may occur during pregnancy, puberty, and menstrual cycles.  Symptoms improve when estrogen levels drop. Drug-induced rhinitis Several drugs can cause nonallergic rhinitis, including:  Medicines that are used to treat high blood pressure, heart disease, and Parkinson disease.  Aspirin and NSAIDs.  Over-the-counter nasal decongestant sprays. These can cause a type of nonallergic rhinitis (rhinitis medicamentosa) when they are used for more than a few days. Nonallergic rhinitis with eosinophilia syndrome (NARES)  This type is caused by  having too much of a certain type of white blood cell (eosinophil). Nonallergic rhinitis can also be caused by a reaction to eating hot or spicy foods. This does not usually cause long-term symptoms. In some cases, the cause of nonallergic rhinitis is not known. What increases the risk? You are more likely to develop this condition if:  You are 73-37 years of age.  You are a woman. Women are twice as likely to have this condition. What are the signs or symptoms? Common symptoms of this condition include:  Nasal congestion.  Runny nose.  The feeling of mucus going down the back of the throat (postnasal drip).  Trouble sleeping at night and daytime sleepiness. Less common symptoms include:  Sneezing.  Coughing.  Itchy nose.  Bloodshot eyes. How is this diagnosed? This condition may be diagnosed based on:  Your symptoms and medical history.  A physical exam.  Allergy testing to rule out allergic rhinitis. You may have skin tests or blood tests. In some cases, the health care provider may take a swab of nasal secretions to look for an increased number of eosinophils. This would be done to confirm a diagnosis of NARES. How is this treated? Treatment for this condition depends on the cause. No single treatment works for everyone. Work with your health care provider to find the best treatment for you. Treatment may include:  Avoiding the things that trigger your symptoms.  Using medicines to relieve congestion, such as: ? Steroid nasal spray. There are many types. You may need to try a few to find out which one works best. ? Decongestant medicine. This may be an oral medicine or a nasal spray. These medicines are only used for  a short time.  Using medicines to relieve a runny nose. These may include antihistamine medicines or anticholinergic nasal sprays.  Surgery to remove tissue from inside the nose may be needed in severe cases if the condition has not improved after 6-12  months of medical treatment. Follow these instructions at home:  Take or use over-the-counter and prescription medicines only as told by your health care provider. Do not stop using your medicine even if you start to feel better.  Use salt-water (saline) rinses or other solutions (nasal washes or irrigations) to wash or rinse out the inside of your nose as told by your health care provider.  Do not take NSAIDs or medicines that contain aspirin if they make your symptoms worse.  Do not drink alcohol if it makes your symptoms worse.  Do not use any tobacco products, such as cigarettes, chewing tobacco, and e-cigarettes. If you need help quitting, ask your health care provider.  Avoid secondhand smoke.  Get some exercise every day. Exercise may help reduce symptoms of nonallergic rhinitis for some people. Ask your health care provider how much exercise and what types of exercise are safe for you.  Sleep with the head of your bed raised (elevated). This may reduce nighttime nasal congestion.  Keep all follow-up visits as told by your health care provider. This is important. Contact a health care provider if:  You have a fever.  Your symptoms are getting worse at home.  Your symptoms are not responding to medicine.  You develop new symptoms, especially a headache or nosebleed. This information is not intended to replace advice given to you by your health care provider. Make sure you discuss any questions you have with your health care provider. Document Released: 02/16/2016 Document Revised: 04/01/2016 Document Reviewed: 01/15/2016 Elsevier Interactive Patient Education  2019 [employer] Allergic Rhinitis, Adult Allergic rhinitis is an allergic reaction that affects the mucous membrane inside the nose. It causes sneezing, a runny or stuffy nose, and the feeling of mucus going down the back of the throat (postnasal drip). Allergic rhinitis can be mild to severe. There are two types of  allergic rhinitis:  Seasonal. This type is also called hay fever. It happens only during certain seasons.  Perennial. This type can happen at any time of the year. What are the causes? This condition happens when the body's defense system (immune system) responds to certain harmless substances called allergens as though they were germs.  Seasonal allergic rhinitis is triggered by pollen, which can come from grasses, trees, and weeds. Perennial allergic rhinitis may be caused by:  House dust mites.  Pet dander.  Mold spores. What are the signs or symptoms? Symptoms of this condition include:  Sneezing.  Runny or stuffy nose (nasal congestion).  Postnasal drip.  Itchy nose.  Tearing of the eyes.  Trouble sleeping.  Daytime sleepiness. How is this diagnosed? This condition may be diagnosed based on:  Your medical history.  A physical exam.  Tests to check for related conditions, such as: ? Asthma. ? Pink eye. ? Ear infection. ? Upper respiratory infection.  Tests to find out which allergens trigger your symptoms. These may include skin or blood tests. How is this treated? There is no cure for this condition, but treatment can help control symptoms. Treatment may include:  Taking medicines that block allergy symptoms, such as antihistamines. Medicine may be given as a shot, nasal spray, or pill.  Avoiding the allergen.  Desensitization. This treatment involves getting ongoing  shots until your body becomes less sensitive to the allergen. This treatment may be done if other treatments do not help.  If taking medicine and avoiding the allergen does not work, new, stronger medicines may be prescribed. Follow these instructions at home:  Find out what you are allergic to. Common allergens include smoke, dust, and pollen.  Avoid the things you are allergic to. These are some things you can do to help avoid allergens: ? Replace carpet with wood, tile, or vinyl  flooring. Carpet can trap dander and dust. ? Do not smoke. Do not allow smoking in your home. ? Change your heating and air conditioning filter at least once a month. ? During allergy season:  Keep windows closed as much as possible.  Plan outdoor activities when pollen counts are lowest. This is usually during the evening hours.  When coming indoors, change clothing and shower before sitting on furniture or bedding.  Take over-the-counter and prescription medicines only as told by your health care provider.  Keep all follow-up visits as told by your health care provider. This is important. Contact a health care provider if:  You have a fever.  You develop a persistent cough.  You make whistling sounds when you breathe (you wheeze).  Your symptoms interfere with your normal daily activities. Get help right away if:  You have shortness of breath. Summary  This condition can be managed by taking medicines as directed and avoiding allergens.  Contact your health care provider if you develop a persistent cough or fever.  During allergy season, keep windows closed as much as possible. This information is not intended to replace advice given to you by your health care provider. Make sure you discuss any questions you have with your health care provider. Document Released: 07/20/2001 Document Revised: 12/02/2016 Document Reviewed: 12/02/2016 Elsevier Interactive Patient Education  2019 [employer]. How to Perform a Sinus Rinse A sinus rinse is a home treatment that is used to rinse your sinuses with a sterile mixture of salt and water (saline solution). Sinuses are air-filled spaces in your skull behind the bones of your face and forehead that open into your nasal cavity. A sinus rinse can help to clear mucus, dirt, dust, or pollen from your nasal cavity. You may do a sinus rinse when you have a cold, a virus, nasal allergy symptoms, a sinus infection, or stuffiness in your nose or  sinuses. Talk with your health care provider about whether a sinus rinse might help you. What are the risks? A sinus rinse is generally safe and effective. However, there are a few risks, which include:  A burning sensation in your sinuses. This may happen if you do not make the saline solution as directed. Be sure to follow all directions when making the saline solution.  Nasal irritation.  Infection from contaminated water. This is rare, but possible. Do not do a sinus rinse if you have had ear or nasal surgery, ear infection, or blocked ears. Supplies needed:  Saline solution or powder.  Distilled or sterile water may be needed to mix with saline powder. ? You may use boiled and cooled tap water. Boil tap water for 5 minutes; cool until it is lukewarm. Use within 24 hours. ? Do not use regular tap water to mix with the saline solution.  Neti pot or nasal rinse bottle. These supplies release the saline solution into your nose and through your sinuses. Neti pots and nasal rinse bottles can be purchased at your  local pharmacy, a health food store, or online. How to perform a sinus rinse  1. Wash your hands with soap and water. 2. Wash your device according to the directions that came with the product and then dry it. 3. Use the solution that comes with your product or one that is sold separately in stores. Follow the mixing directions on the package if you need to mix with sterile or distilled water. 4. Fill the device with the amount of saline solution noted in the device instructions. 5. Stand over a sink and tilt your head sideways over the sink. 6. Place the spout of the device in your upper nostril (the one closer to the ceiling). 7. Gently pour or squeeze the saline solution into your nasal cavity. The liquid should drain out from the lower nostril if you are not too congested. 8. While rinsing, breathe through your open mouth. 9. Gently blow your nose to clear any mucus and rinse  solution. Blowing too hard may cause ear pain. 10. Repeat in your other nostril. 11. Clean and rinse your device with clean water and then air-dry it. Talk with your health care provider or pharmacist if you have questions about how to do a sinus rinse. Summary  A sinus rinse is a home treatment that is used to rinse your sinuses with a sterile mixture of salt and water (saline solution).  A sinus rinse is generally safe and effective. Follow all instructions carefully.  Before doing a sinus rinse, talk with your health care provider about whether it would be helpful for you. This information is not intended to replace advice given to you by your health care provider. Make sure you discuss any questions you have with your health care provider. Document Released: 05/22/2014 Document Revised: 08/22/2017 Document Reviewed: 08/22/2017 Elsevier Interactive Patient Education  2019 [employer] Eustachian Tube Dysfunction  Eustachian tube dysfunction refers to a condition in which a blockage develops in the narrow passage that connects the middle ear to the back of the nose (eustachian tube). The eustachian tube regulates air pressure in the middle ear by letting air move between the ear and nose. It also helps to drain fluid from the middle ear space. Eustachian tube dysfunction can affect one or both ears. When the eustachian tube does not function properly, air pressure, fluid, or both can build up in the middle ear. What are the causes? This condition occurs when the eustachian tube becomes blocked or cannot open normally. Common causes of this condition include:  Ear infections.  Colds and other infections that affect the nose, mouth, and throat (upper respiratory tract).  Allergies.  Irritation from cigarette smoke.  Irritation from stomach acid coming up into the esophagus (gastroesophageal reflux). The esophagus is the tube that carries food from the mouth to the stomach.  Sudden  changes in air pressure, such as from descending in an airplane or scuba diving.  Abnormal growths in the nose or throat, such as: ? Growths that line the nose (nasal polyps). ? Abnormal growth of cells (tumors). ? Enlarged tissue at the back of the throat (adenoids). What increases the risk? You are more likely to develop this condition if:  You smoke.  You are overweight.  You are a child who has: ? Certain birth defects of the mouth, such as cleft palate. ? Large tonsils or adenoids. What are the signs or symptoms? Common symptoms of this condition include:  A feeling of fullness in the ear.  Ear pain.  Clicking or popping noises in the ear.  Ringing in the ear.  Hearing loss.  Loss of balance.  Dizziness. Symptoms may get worse when the air pressure around you changes, such as when you travel to an area of high elevation, fly on an airplane, or go scuba diving. How is this diagnosed? This condition may be diagnosed based on:  Your symptoms.  A physical exam of your ears, nose, and throat.  Tests, such as those that measure: ? The movement of your eardrum (tympanogram). ? Your hearing (audiometry). How is this treated? Treatment depends on the cause and severity of your condition.  In mild cases, you may relieve your symptoms by moving air into your ears. This is called "popping the ears."  In more severe cases, or if you have symptoms of fluid in your ears, treatment may include: ? Medicines to relieve congestion (decongestants). ? Medicines that treat allergies (antihistamines). ? Nasal sprays or ear drops that contain medicines that reduce swelling (steroids). ? A procedure to drain the fluid in your eardrum (myringotomy). In this procedure, a small tube is placed in the eardrum to:  Drain the fluid.  Restore the air in the middle ear space. ? A procedure to insert a balloon device through the nose to inflate the opening of the eustachian tube (balloon  dilation). Follow these instructions at home: Lifestyle  Do not do any of the following until your health care provider approves: ? Travel to high altitudes. ? Fly in airplanes. ? Work in a Estate agent or room. ? Scuba dive.  Do not use any products that contain nicotine or tobacco, such as cigarettes and e-cigarettes. If you need help quitting, ask your health care provider.  Keep your ears dry. Wear fitted earplugs during showering and bathing. Dry your ears completely after. General instructions  Take over-the-counter and prescription medicines only as told by your health care provider.  Use techniques to help pop your ears as recommended by your health care provider. These may include: ? Chewing gum. ? Yawning. ? Frequent, forceful swallowing. ? Closing your mouth, holding your nose closed, and gently blowing as if you are trying to blow air out of your nose.  Keep all follow-up visits as told by your health care provider. This is important. Contact a health care provider if:  Your symptoms do not go away after treatment.  Your symptoms come back after treatment.  You are unable to pop your ears.  You have: ? A fever. ? Pain in your ear. ? Pain in your head or neck. ? Fluid draining from your ear.  Your hearing suddenly changes.  You become very dizzy.  You lose your balance. Summary  Eustachian tube dysfunction refers to a condition in which a blockage develops in the eustachian tube.  It can be caused by ear infections, allergies, inhaled irritants, or abnormal growths in the nose or throat.  Symptoms include ear pain, hearing loss, or ringing in the ears.  Mild cases are treated with maneuvers to unblock the ears, such as yawning or ear popping.  Severe cases are treated with medicines. Surgery may also be done (rare). This information is not intended to replace advice given to you by your health care provider. Make sure you discuss any questions you  have with your health care provider. Document Released: 11/21/2015 Document Revised: 02/14/2018 Document Reviewed: 02/14/2018 Elsevier Interactive Patient Education  2019 [employer].

## 2019-02-26 NOTE — Progress Notes (Signed)
Subjective:    Patient ID: Brandon Jackson, male    DOB: 1981-01-07, 38 y.o.   MRN: [id number]  38y/o established male with intermittent ear pain, post nasal drip  Pain typically worst later in the day.  Motrin prn.  Nose sprays are not his favorite thing to use.  "it doesn't feel like a sinus infection"  Denied ear discharge, fever, chills, headache, teeth pain nausea/vomiting/diarrhea. Last seen by NP [facility] 01/08/2019 started on antihistamine for post nasal drip.   Last antibiotic use azithromycin 15 years ago denied side effects.     Review of Systems  Constitutional: Negative for activity change, appetite change, chills, diaphoresis, fatigue and fever.  HENT: Positive for congestion, ear pain, postnasal drip and rhinorrhea. Negative for dental problem, drooling, ear discharge, facial swelling, hearing loss, mouth sores, nosebleeds, sinus pressure, sinus pain, trouble swallowing and voice change.   Eyes: Negative for photophobia and visual disturbance.  Respiratory: Negative for cough, shortness of breath, wheezing and stridor.   Cardiovascular: Negative for chest pain.  Gastrointestinal: Negative for diarrhea, nausea and vomiting.  Endocrine: Negative for cold intolerance and heat intolerance.  Genitourinary: Negative for difficulty urinating.  Musculoskeletal: Negative for neck pain and neck stiffness.  Allergic/Immunologic: Negative for food allergies.  Neurological: Negative for dizziness and headaches.  Hematological: Does not bruise/bleed easily.  Psychiatric/Behavioral: Negative for sleep disturbance.       Objective:   Physical Exam Pulmonary:     Effort: Pulmonary effort is normal. No respiratory distress.     Breath sounds: No stridor. No wheezing.  Neurological:     Mental Status: He is alert and oriented to person, place, and time.  Psychiatric:        Attention and Perception: Attention and perception normal.        Mood and Affect: Mood normal.      Speech: Speech normal.        Behavior: Behavior normal. Behavior is cooperative.        Thought Content: Thought content normal.        Cognition and Memory: Cognition and memory normal.        Judgment: Judgment normal.           Assessment & Plan:  A-acute rhinitis, bilateral eustachian tube dysfunction  This visit was conducted via telephone due to covid 19 pandemic precautions.  P-Patient may use normal saline nasal spray 2 sprays each nostril q2h wa as needed. flonase 1 spray each nostril BID #1 RF2.  Use nasal saline first may run down back of throat or cough/spit out or blow nose prior to instilling flonase.  Flonase light sniff should not taste/feel in back of throat if yes repeat inhale with lighter sniff during administration.  Discussed tree pollen high, grass medium and mold/fungus low this week unless more rain.  Weather front can increase pressure in cavities/cause pain sinuses/ears/teeth.  Patient denied personal or family history of ENT cancer.  OTC antihistamine of choice claritin/zyrtec 10mg  po daily.  Avoid triggers if possible.  Shower prior to bedtime if exposed to triggers.  If allergic dust/dust mites recommend mattress/pillow covers/encasements; washing linens, vacuuming, sweeping, dusting weekly.  Call or return to clinic as needed if these symptoms worsen or fail to improve as anticipated.   Exitcare handout on allergic rhinitis and sinus rinse.  Patient verbalized understanding of instructions, agreed with plan of care and had no further questions at this time.  P2:  Avoidance and hand washing.  Supportive treatment.  Motrin at home prn 200mg  take with food.  Discussed will take 30 days for middle ear fluid to clear; need to dry up post nasal drip so throat irritation/swelling resolves for eustachian tubes to drain again.  Take flonase/nasal saline and zyrtec every day for next 30 days.  Discussed if ear discharge/fever/chills would consider starting  azithromycin--he has used in the past without side effects.  Augmentin interacts with his other medications.  No evidence of invasive bacterial infection, non toxic and well hydrated.  This is most likely self limiting viral infection.  I do not see where any further testing or imaging is necessary at this time.   I will suggest supportive care, rest, good hygiene and encourage the patient to take adequate fluids.  The patient is to return to clinic or EMERGENCY ROOM if symptoms worsen or change significantly e.g. ear pain, fever, purulent discharge from ears or bleeding.  Exitcare handout on eustachian tube dysfunction.  Patient verbalized agreement and understanding of treatment plan.

## 2019-02-26 NOTE — Progress Notes (Signed)
38y/o established male with intermittent ear pain, post nasal drip  Pain typically worst later in the day.  Motrin prn.  Nose sprays are not his favorite thing to use.  "it doesn't feel like a sinus infection"  Denied ear discharge, fever, chills, headache, teeth pain nausea/vomiting/diarrhea. Last seen by NP [facility] 01/08/2019 started on antihistamine for post nasal drip.   Last antibiotic use azithromycin 15 years ago denied side effects.     Review of Systems  Constitutional: Negative for activity change, appetite change, chills, diaphoresis, fatigue and fever.  HENT: Positive for congestion, ear pain, postnasal drip and rhinorrhea. Negative for dental problem, drooling, ear discharge, facial swelling, hearing loss, mouth sores, nosebleeds, sinus pressure, sinus pain, trouble swallowing and voice change.   Eyes: Negative for photophobia and visual disturbance.  Respiratory: Negative for cough, shortness of breath, wheezing and stridor.   Cardiovascular: Negative for chest pain.  Gastrointestinal: Negative for diarrhea, nausea and vomiting.  Endocrine: Negative for cold intolerance and heat intolerance.  Genitourinary: Negative for difficulty urinating.  Musculoskeletal: Negative for neck pain and neck stiffness.  Allergic/Immunologic: Negative for food allergies.  Neurological: Negative for dizziness and headaches.  Hematological: Does not bruise/bleed easily.  Psychiatric/Behavioral: Negative for sleep disturbance.       Objective:   Objective   Physical Exam Pulmonary:     Effort: Pulmonary effort is normal. No respiratory distress.     Breath sounds: No stridor. No wheezing.  Neurological:     Mental Status: He is alert and oriented to person, place, and time.  Psychiatric:        Attention and Perception: Attention and perception normal.        Mood and Affect: Mood normal.        Speech: Speech normal.        Behavior: Behavior normal. Behavior is cooperative.         Thought Content: Thought content normal.        Cognition and Memory: Cognition and memory normal.        Judgment: Judgment normal.           Assessment & Plan:  A-acute rhinitis, bilateral eustachian tube dysfunction  This visit was conducted via telephone due to covid 19 pandemic precautions.  P-Patient may use normal saline nasal spray 2 sprays each nostril q2h wa as needed. flonase 1 spray each nostril BID #1 RF2.  Use nasal saline first may run down back of throat or cough/spit out or blow nose prior to instilling flonase.  Flonase light sniff should not taste/feel in back of throat if yes repeat inhale with lighter sniff during administration.  Discussed tree pollen high, grass medium and mold/fungus low this week unless more rain.  Weather front can increase pressure in cavities/cause pain sinuses/ears/teeth.  Patient denied personal or family history of ENT cancer.  OTC antihistamine of choice claritin/zyrtec 10mg  po daily.  Avoid triggers if possible.  Shower prior to bedtime if exposed to triggers.  If allergic dust/dust mites recommend mattress/pillow covers/encasements; washing linens, vacuuming, sweeping, dusting weekly.  Call or return to clinic as needed if these symptoms worsen or fail to improve as anticipated.   Exitcare handout on allergic rhinitis and sinus rinse.  Patient verbalized understanding of instructions, agreed with plan of care and had no further questions at this time.  P2:  Avoidance and hand washing.  Supportive treatment. Motrin at home prn 200mg  take with food.  Discussed will take 30 days for middle ear  fluid to clear; need to dry up post nasal drip so throat irritation/swelling resolves for eustachian tubes to drain again.  Take flonase/nasal saline and zyrtec every day for next 30 days.  Discussed if ear discharge/fever/chills would consider starting azithromycin--he has used in the past without side effects.  Augmentin interacts with  his other medications.  No evidence of invasive bacterial infection, non toxic and well hydrated.  This is most likely self limiting viral infection.  I do not see where any further testing or imaging is necessary at this time.   I will suggest supportive care, rest, good hygiene and encourage the patient to take adequate fluids.  The patient is to return to clinic or EMERGENCY ROOM if symptoms worsen or change significantly e.g. ear pain, fever, purulent discharge from ears or bleeding.  Exitcare handout on eustachian tube dysfunction.  Patient verbalized agreement and understanding of treatment plan.

## 2019-03-06 ENCOUNTER — Ambulatory Visit (INDEPENDENT_AMBULATORY_CARE_PROVIDER_SITE_OTHER): Payer: BLUE CROSS/BLUE SHIELD | Admitting: Psychology

## 2019-03-06 DIAGNOSIS — F411 Generalized anxiety disorder: Secondary | ICD-10-CM | POA: Diagnosis not present

## 2019-03-06 DIAGNOSIS — F331 Major depressive disorder, recurrent, moderate: Secondary | ICD-10-CM | POA: Diagnosis not present

## 2019-03-07 ENCOUNTER — Other Ambulatory Visit: Payer: Self-pay | Admitting: Family Medicine

## 2019-03-07 NOTE — Telephone Encounter (Signed)
Sent. Thanks.   

## 2019-03-07 NOTE — Telephone Encounter (Signed)
Last filled 12-07-18 #30 Last OV 09-14-18 No Future OV [facility])

## 2019-03-12 ENCOUNTER — Other Ambulatory Visit: Payer: Self-pay | Admitting: Family Medicine

## 2019-03-12 NOTE — Telephone Encounter (Signed)
Okay to use prn, rx sent.  Please get update on patient. Thanks.

## 2019-03-12 NOTE — Telephone Encounter (Signed)
Electronic refill request. Cyclobenzaprine Last office visit:   09/14/2018 Last Filled:   Medication is no longer on current meds list. Please advise.

## 2019-03-13 NOTE — Telephone Encounter (Signed)
Noted.  If not any better by the end of this rx then please update me.  Thanks.

## 2019-03-13 NOTE — Telephone Encounter (Signed)
Tried to call pt . No answer.  

## 2019-03-13 NOTE — Telephone Encounter (Signed)
Spoke to patient and was advised that the pain is still there and he is trying not to sit. Patient stated that he is having back spasms because he is standing all day.

## 2019-03-14 NOTE — Telephone Encounter (Signed)
Tried to call patient and no answer.

## 2019-03-16 NOTE — Telephone Encounter (Signed)
Patient advised.

## 2019-03-19 DIAGNOSIS — F5105 Insomnia due to other mental disorder: Secondary | ICD-10-CM | POA: Diagnosis not present

## 2019-03-19 DIAGNOSIS — F411 Generalized anxiety disorder: Secondary | ICD-10-CM | POA: Diagnosis not present

## 2019-03-19 DIAGNOSIS — F330 Major depressive disorder, recurrent, mild: Secondary | ICD-10-CM | POA: Diagnosis not present

## 2019-04-17 ENCOUNTER — Ambulatory Visit (INDEPENDENT_AMBULATORY_CARE_PROVIDER_SITE_OTHER): Payer: BC Managed Care – PPO | Admitting: Psychology

## 2019-04-17 DIAGNOSIS — F411 Generalized anxiety disorder: Secondary | ICD-10-CM | POA: Diagnosis not present

## 2019-04-17 DIAGNOSIS — F331 Major depressive disorder, recurrent, moderate: Secondary | ICD-10-CM | POA: Diagnosis not present

## 2019-05-29 ENCOUNTER — Ambulatory Visit (INDEPENDENT_AMBULATORY_CARE_PROVIDER_SITE_OTHER): Payer: BC Managed Care – PPO | Admitting: Psychology

## 2019-05-29 DIAGNOSIS — F411 Generalized anxiety disorder: Secondary | ICD-10-CM

## 2019-05-29 DIAGNOSIS — F331 Major depressive disorder, recurrent, moderate: Secondary | ICD-10-CM | POA: Diagnosis not present

## 2019-06-20 DIAGNOSIS — F411 Generalized anxiety disorder: Secondary | ICD-10-CM | POA: Diagnosis not present

## 2019-06-20 DIAGNOSIS — F330 Major depressive disorder, recurrent, mild: Secondary | ICD-10-CM | POA: Diagnosis not present

## 2019-06-20 DIAGNOSIS — F5105 Insomnia due to other mental disorder: Secondary | ICD-10-CM | POA: Diagnosis not present

## 2019-08-21 ENCOUNTER — Ambulatory Visit (INDEPENDENT_AMBULATORY_CARE_PROVIDER_SITE_OTHER): Payer: BC Managed Care – PPO | Admitting: Psychology

## 2019-08-21 DIAGNOSIS — F331 Major depressive disorder, recurrent, moderate: Secondary | ICD-10-CM | POA: Diagnosis not present

## 2019-08-21 DIAGNOSIS — F411 Generalized anxiety disorder: Secondary | ICD-10-CM

## 2019-08-28 ENCOUNTER — Ambulatory Visit: Payer: Self-pay

## 2019-08-28 ENCOUNTER — Other Ambulatory Visit: Payer: Self-pay

## 2019-08-28 DIAGNOSIS — Z23 Encounter for immunization: Secondary | ICD-10-CM

## 2019-09-19 ENCOUNTER — Ambulatory Visit (INDEPENDENT_AMBULATORY_CARE_PROVIDER_SITE_OTHER): Payer: BC Managed Care – PPO | Admitting: Psychology

## 2019-09-19 DIAGNOSIS — F331 Major depressive disorder, recurrent, moderate: Secondary | ICD-10-CM

## 2019-09-19 DIAGNOSIS — F411 Generalized anxiety disorder: Secondary | ICD-10-CM

## 2019-10-15 DIAGNOSIS — F411 Generalized anxiety disorder: Secondary | ICD-10-CM | POA: Diagnosis not present

## 2019-10-15 DIAGNOSIS — F5105 Insomnia due to other mental disorder: Secondary | ICD-10-CM | POA: Diagnosis not present

## 2019-10-15 DIAGNOSIS — F330 Major depressive disorder, recurrent, mild: Secondary | ICD-10-CM | POA: Diagnosis not present

## 2019-10-17 ENCOUNTER — Ambulatory Visit (INDEPENDENT_AMBULATORY_CARE_PROVIDER_SITE_OTHER): Payer: BC Managed Care – PPO | Admitting: Psychology

## 2019-10-17 DIAGNOSIS — F411 Generalized anxiety disorder: Secondary | ICD-10-CM | POA: Diagnosis not present

## 2019-10-17 DIAGNOSIS — F331 Major depressive disorder, recurrent, moderate: Secondary | ICD-10-CM

## 2019-11-07 DIAGNOSIS — F330 Major depressive disorder, recurrent, mild: Secondary | ICD-10-CM | POA: Diagnosis not present

## 2019-11-07 DIAGNOSIS — F411 Generalized anxiety disorder: Secondary | ICD-10-CM | POA: Diagnosis not present

## 2019-11-07 DIAGNOSIS — F5105 Insomnia due to other mental disorder: Secondary | ICD-10-CM | POA: Diagnosis not present

## 2019-11-20 ENCOUNTER — Ambulatory Visit (INDEPENDENT_AMBULATORY_CARE_PROVIDER_SITE_OTHER): Payer: BC Managed Care – PPO | Admitting: Psychology

## 2019-11-20 DIAGNOSIS — F331 Major depressive disorder, recurrent, moderate: Secondary | ICD-10-CM

## 2019-11-20 DIAGNOSIS — F411 Generalized anxiety disorder: Secondary | ICD-10-CM

## 2019-12-12 ENCOUNTER — Ambulatory Visit (INDEPENDENT_AMBULATORY_CARE_PROVIDER_SITE_OTHER): Payer: BC Managed Care – PPO | Admitting: Psychology

## 2019-12-12 DIAGNOSIS — F331 Major depressive disorder, recurrent, moderate: Secondary | ICD-10-CM

## 2019-12-12 DIAGNOSIS — F411 Generalized anxiety disorder: Secondary | ICD-10-CM

## 2019-12-27 DIAGNOSIS — F330 Major depressive disorder, recurrent, mild: Secondary | ICD-10-CM | POA: Diagnosis not present

## 2019-12-27 DIAGNOSIS — F411 Generalized anxiety disorder: Secondary | ICD-10-CM | POA: Diagnosis not present

## 2019-12-27 DIAGNOSIS — F5105 Insomnia due to other mental disorder: Secondary | ICD-10-CM | POA: Diagnosis not present

## 2020-01-04 IMAGING — DX DG SACRUM/COCCYX 2+V
3 series · 3 of 3 positions shown · non-contrast
Comparison: None.

CLINICAL DATA: Sacral pain.

EXAM:
SACRUM AND COCCYX - 2+ VIEW

[coccyx ap]
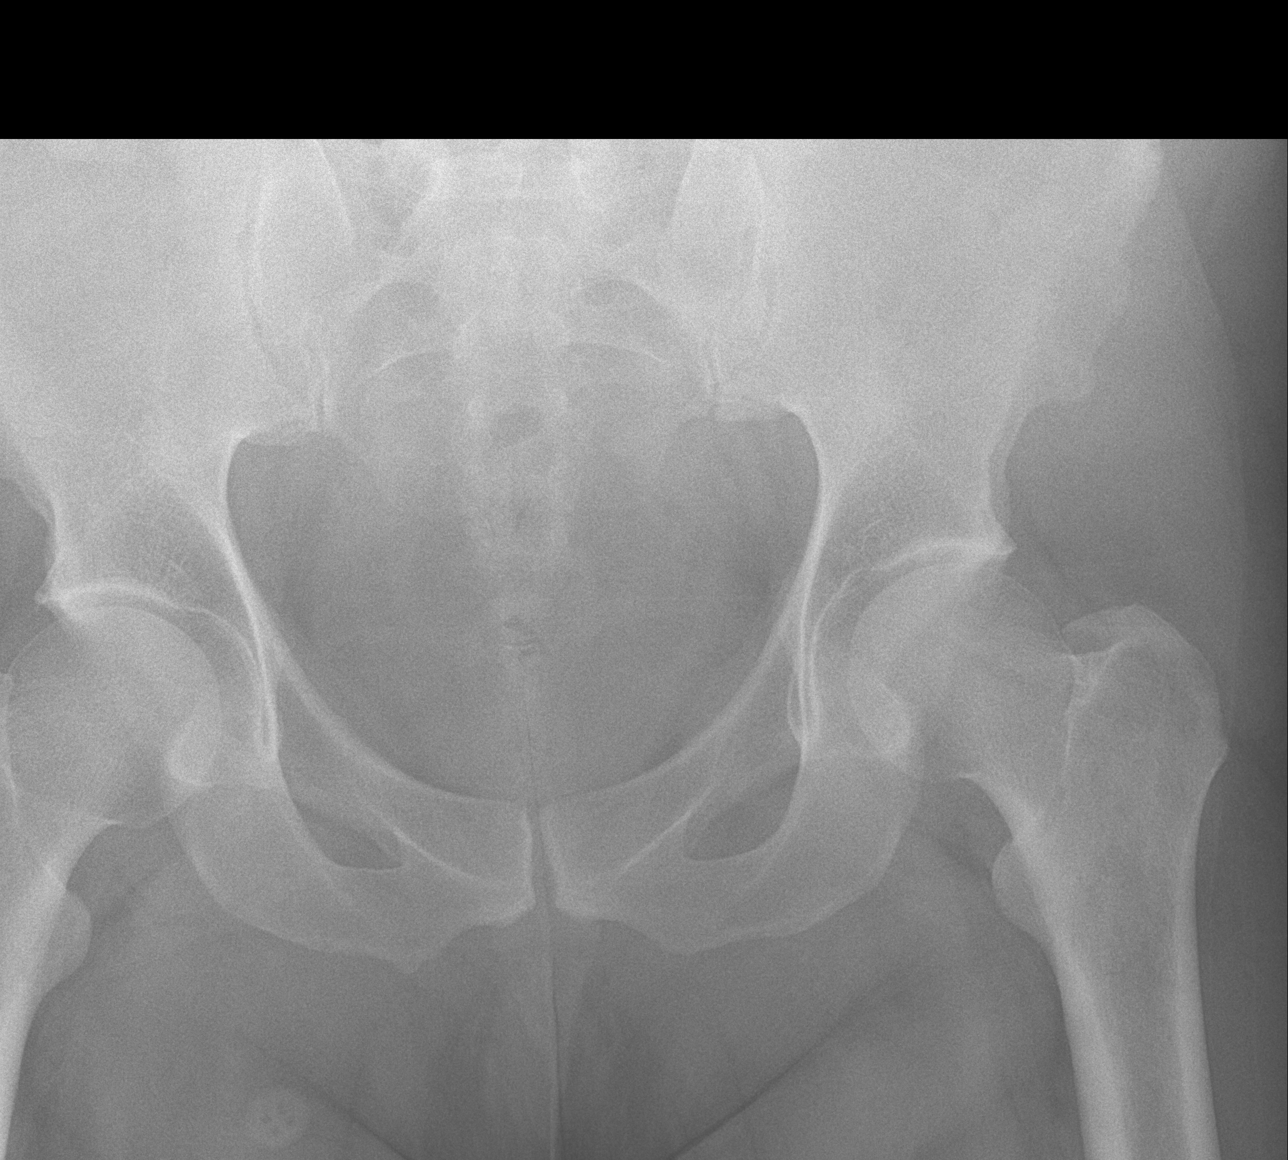

[sacrum ap]
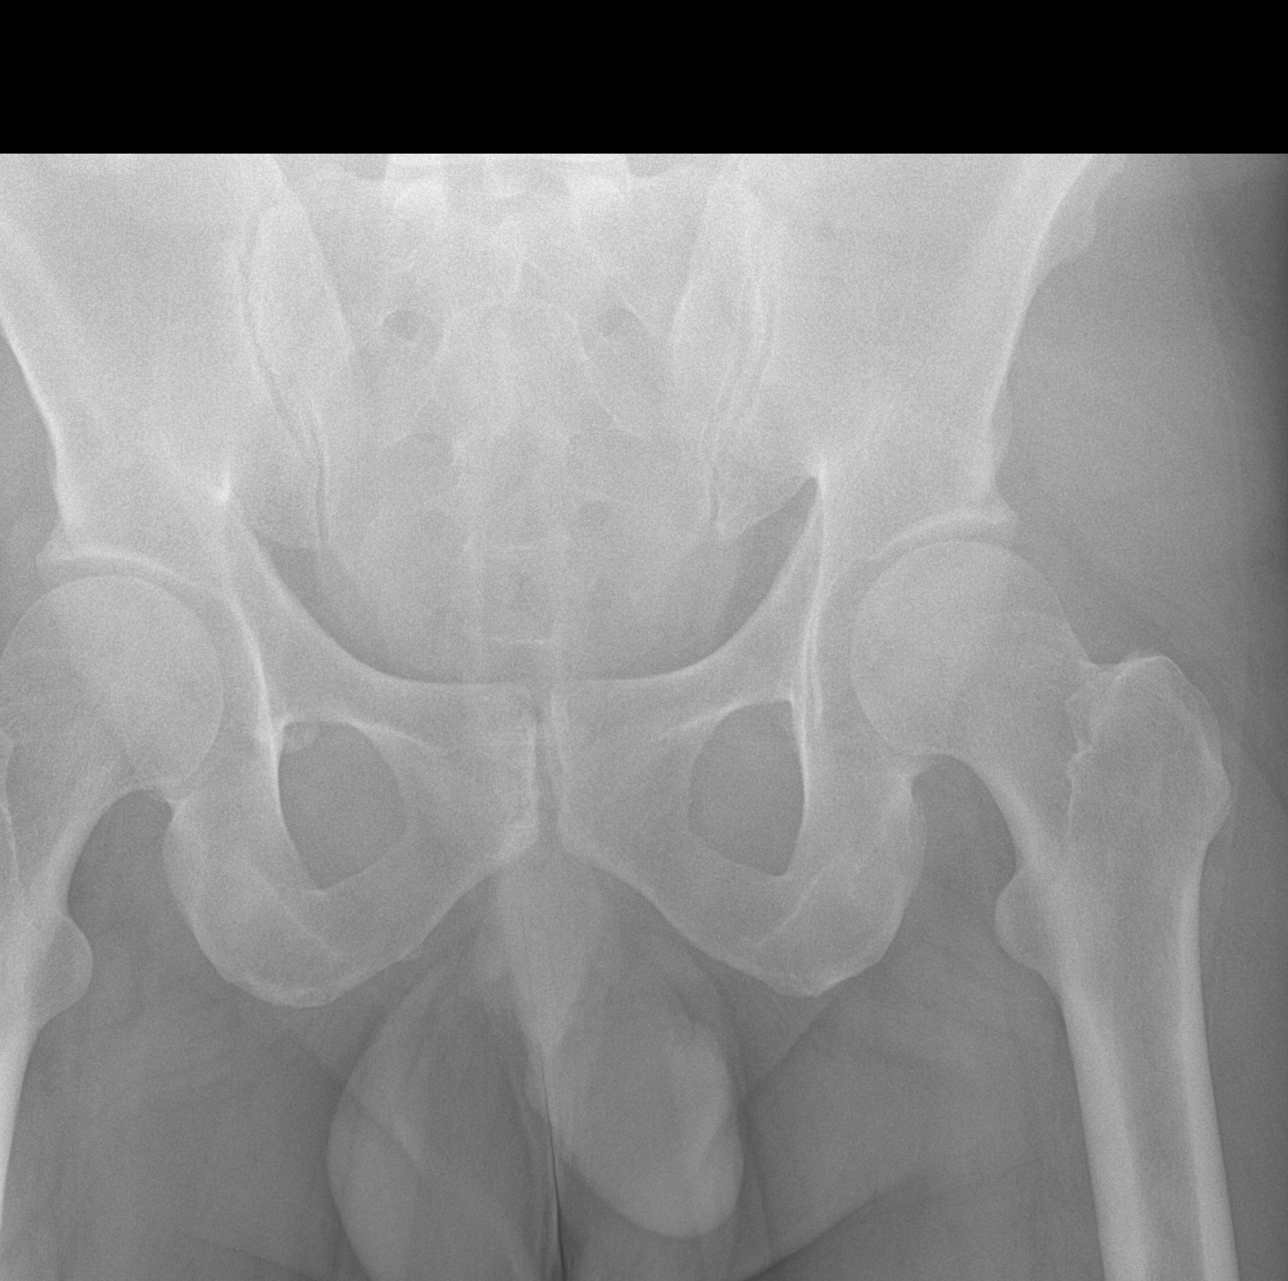

[sacrum lat]
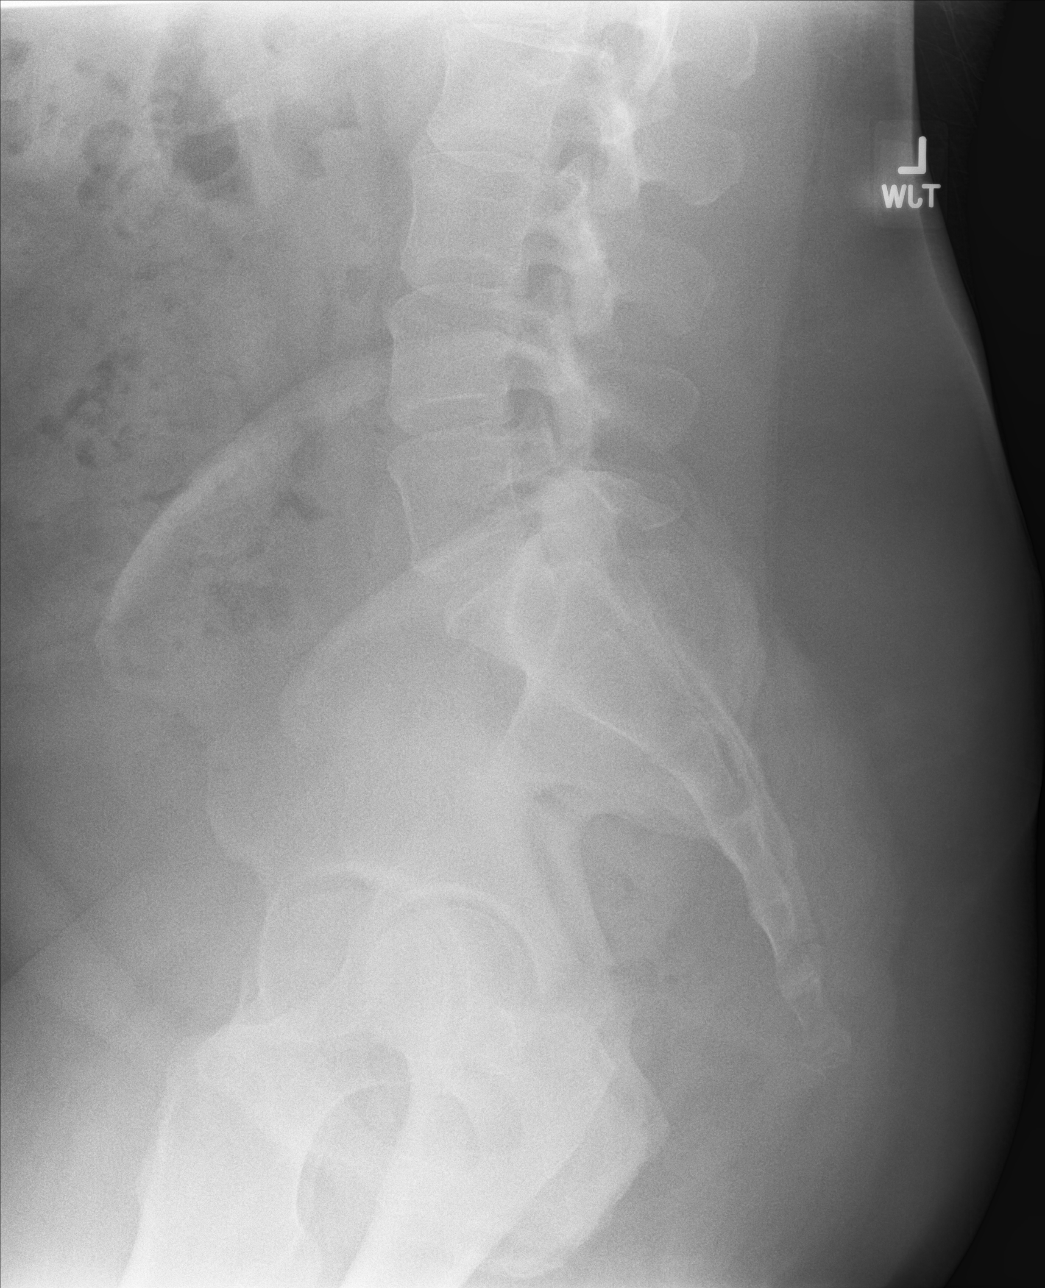

[3 of 3 positions shown; findings below may reference images not displayed]

FINDINGS: There is no evidence of fracture or other focal bone lesions.
IMPRESSION: Normal exam.

## 2020-01-09 ENCOUNTER — Ambulatory Visit (INDEPENDENT_AMBULATORY_CARE_PROVIDER_SITE_OTHER): Payer: BC Managed Care – PPO | Admitting: Psychology

## 2020-01-09 DIAGNOSIS — F331 Major depressive disorder, recurrent, moderate: Secondary | ICD-10-CM | POA: Diagnosis not present

## 2020-01-09 DIAGNOSIS — F411 Generalized anxiety disorder: Secondary | ICD-10-CM

## 2020-01-11 ENCOUNTER — Encounter: Payer: Self-pay | Admitting: Family Medicine

## 2020-01-15 ENCOUNTER — Other Ambulatory Visit: Payer: Self-pay | Admitting: Family Medicine

## 2020-01-15 DIAGNOSIS — M533 Sacrococcygeal disorders, not elsewhere classified: Secondary | ICD-10-CM

## 2020-01-18 DIAGNOSIS — F5105 Insomnia due to other mental disorder: Secondary | ICD-10-CM | POA: Diagnosis not present

## 2020-01-18 DIAGNOSIS — F330 Major depressive disorder, recurrent, mild: Secondary | ICD-10-CM | POA: Diagnosis not present

## 2020-01-18 DIAGNOSIS — F411 Generalized anxiety disorder: Secondary | ICD-10-CM | POA: Diagnosis not present

## 2020-02-06 DIAGNOSIS — M533 Sacrococcygeal disorders, not elsewhere classified: Secondary | ICD-10-CM | POA: Diagnosis not present

## 2020-02-21 ENCOUNTER — Ambulatory Visit (INDEPENDENT_AMBULATORY_CARE_PROVIDER_SITE_OTHER): Payer: BC Managed Care – PPO | Admitting: Psychology

## 2020-02-21 DIAGNOSIS — F331 Major depressive disorder, recurrent, moderate: Secondary | ICD-10-CM

## 2020-02-21 DIAGNOSIS — F411 Generalized anxiety disorder: Secondary | ICD-10-CM | POA: Diagnosis not present

## 2020-04-21 DIAGNOSIS — F5105 Insomnia due to other mental disorder: Secondary | ICD-10-CM | POA: Diagnosis not present

## 2020-04-21 DIAGNOSIS — F411 Generalized anxiety disorder: Secondary | ICD-10-CM | POA: Diagnosis not present

## 2020-04-21 DIAGNOSIS — F330 Major depressive disorder, recurrent, mild: Secondary | ICD-10-CM | POA: Diagnosis not present

## 2020-05-06 ENCOUNTER — Ambulatory Visit (INDEPENDENT_AMBULATORY_CARE_PROVIDER_SITE_OTHER): Payer: BC Managed Care – PPO | Admitting: Psychology

## 2020-05-06 DIAGNOSIS — F411 Generalized anxiety disorder: Secondary | ICD-10-CM | POA: Diagnosis not present

## 2020-05-06 DIAGNOSIS — F331 Major depressive disorder, recurrent, moderate: Secondary | ICD-10-CM

## 2020-07-25 DIAGNOSIS — M9903 Segmental and somatic dysfunction of lumbar region: Secondary | ICD-10-CM | POA: Diagnosis not present

## 2020-07-25 DIAGNOSIS — M222X2 Patellofemoral disorders, left knee: Secondary | ICD-10-CM | POA: Diagnosis not present

## 2020-07-25 DIAGNOSIS — M9904 Segmental and somatic dysfunction of sacral region: Secondary | ICD-10-CM | POA: Diagnosis not present

## 2020-07-25 DIAGNOSIS — M533 Sacrococcygeal disorders, not elsewhere classified: Secondary | ICD-10-CM | POA: Diagnosis not present

## 2020-08-01 DIAGNOSIS — M533 Sacrococcygeal disorders, not elsewhere classified: Secondary | ICD-10-CM | POA: Diagnosis not present

## 2020-08-01 DIAGNOSIS — M9904 Segmental and somatic dysfunction of sacral region: Secondary | ICD-10-CM | POA: Diagnosis not present

## 2020-08-01 DIAGNOSIS — M222X2 Patellofemoral disorders, left knee: Secondary | ICD-10-CM | POA: Diagnosis not present

## 2020-08-01 DIAGNOSIS — M9903 Segmental and somatic dysfunction of lumbar region: Secondary | ICD-10-CM | POA: Diagnosis not present

## 2020-08-11 DIAGNOSIS — M533 Sacrococcygeal disorders, not elsewhere classified: Secondary | ICD-10-CM | POA: Diagnosis not present

## 2020-08-11 DIAGNOSIS — M9903 Segmental and somatic dysfunction of lumbar region: Secondary | ICD-10-CM | POA: Diagnosis not present

## 2020-08-11 DIAGNOSIS — M7712 Lateral epicondylitis, left elbow: Secondary | ICD-10-CM | POA: Diagnosis not present

## 2020-08-11 DIAGNOSIS — M222X2 Patellofemoral disorders, left knee: Secondary | ICD-10-CM | POA: Diagnosis not present

## 2020-08-20 DIAGNOSIS — F330 Major depressive disorder, recurrent, mild: Secondary | ICD-10-CM | POA: Diagnosis not present

## 2020-08-20 DIAGNOSIS — F411 Generalized anxiety disorder: Secondary | ICD-10-CM | POA: Diagnosis not present

## 2020-08-20 DIAGNOSIS — F5105 Insomnia due to other mental disorder: Secondary | ICD-10-CM | POA: Diagnosis not present

## 2020-08-25 DIAGNOSIS — M9903 Segmental and somatic dysfunction of lumbar region: Secondary | ICD-10-CM | POA: Diagnosis not present

## 2020-08-25 DIAGNOSIS — M222X2 Patellofemoral disorders, left knee: Secondary | ICD-10-CM | POA: Diagnosis not present

## 2020-08-25 DIAGNOSIS — M7712 Lateral epicondylitis, left elbow: Secondary | ICD-10-CM | POA: Diagnosis not present

## 2020-08-25 DIAGNOSIS — M533 Sacrococcygeal disorders, not elsewhere classified: Secondary | ICD-10-CM | POA: Diagnosis not present

## 2020-09-03 ENCOUNTER — Telehealth: Payer: BC Managed Care – PPO | Admitting: Nurse Practitioner

## 2020-09-03 DIAGNOSIS — M109 Gout, unspecified: Secondary | ICD-10-CM | POA: Diagnosis not present

## 2020-09-03 MED ORDER — COLCHICINE 0.6 MG PO TABS: ORAL_TABLET | ORAL | 1 refills | Status: DC

## 2020-09-03 NOTE — Progress Notes (Signed)
E-Visit for Gout  We are sorry that you are not feeling well. We are here to help!  Based on what you shared with me it looks like you have a flare of your gout.  Gout is a form of arthritis. It can cause pain and swelling in the joints. At first, it tends to affect only 1 joint - most frequently the big toe. It happens in people who have too much uric acid in the blood. Uric acid is a chemical that is produced when the body breaks down certain foods. Uric acid can form sharp needle-like crystals that build up in the joints and cause pain. Uric acid crystals can also form inside the tubes that carry urine from the kidneys to the bladder. These crystals can turn into "kidney stones" that can cause pain and problems with the flow of urine. People with gout get sudden "flares" or attacks of severe pain, most often the big toe, ankle, or knee. Often the joint also turns red and swells. Usually, only 1 joint is affected, but some people have pain in more than 1 joint. Gout flares tend to happen more often during the night.  The pain from gout can be extreme. The pain and swelling are worst at the beginning of a gout flare. The symptoms then get better within a few days to weeks. It is not clear how the body "turns off" a gout flare.  Do not start any NEW preventative medicine until the gout has cleared completely. However, If you are already on Probenecid or Allopurinol for CHRONIC gout, you may continue taking this during an active flare up  I have prescribed Colchicine 0.6 mg tabs - Take 2 tabs immediately, then 1 tab twice per day for the duration of the flare up to a max of 7 days (but discontinue for stomach pains or diarrhea)    HOME CARE Losing weight can help relieve gout. It's not clear that following a specific diet plan will help with gout symptoms but eating a balanced diet can help improve your overall health. It can also help you lose weight, if you are overweight. In general, a healthy diet  includes plenty of fruits, vegetables, whole grains, and low-fat dairy products (labelled "low fat", skim, 2%). Avoid sugar sweetened drinks (including sodas, tea, juice and juice blends, coffee drinks and sports drinks) Limit alcohol to 1-2 drinks of beer, spirits or wine daily these can make gout flares worse. Some people with gout also have other health problems, such as heart disease, high blood pressure, kidney disease, or obesity. If you have any of these issues, it's important to work with your doctor to manage them. This can help improve your overall health and might also help with your gout.  GET HELP RIGHT AWAY IF: . Your symptoms persist after you have completed your treatment plan . You develop severe diarrhea . You develop abnormal sensations .  You develop vomiting,  .  You develop weakness .  You develop abdominal pain  FOLLOW UP WITH YOUR PRIMARY PROVIDER IF: . If your symptoms do not improve within 10 days  MAKE SURE YOU   Understand these instructions.  Will watch your condition.  Will get help right away if you are not doing well or get worse.  Your e-visit answers were reviewed by a board certified advanced clinical practitioner to complete your personal care plan. Depending upon the condition, your plan could have included both over the counter or prescription medications.  Your  safety is important to [place]. If you have drug allergies check your prescription carefully.   You can use MyChart to ask questions about today's visit, request a non-urgent call back, or ask for a work or school excuse for 24 hours related to this e-Visit. If it has been greater than 24 hours you will need to follow up with your provider, or enter a new e-Visit to address those concerns. You will get an e-mail with a link to a survey asking about your experience.  We hope that your e-visit has been valuable and will speed your recovery! Thank you for using e-visits.  5-10 minutes spent reviewing  and documenting in chart.

## 2020-09-11 DIAGNOSIS — M9903 Segmental and somatic dysfunction of lumbar region: Secondary | ICD-10-CM | POA: Diagnosis not present

## 2020-09-11 DIAGNOSIS — M533 Sacrococcygeal disorders, not elsewhere classified: Secondary | ICD-10-CM | POA: Diagnosis not present

## 2020-09-11 DIAGNOSIS — M222X2 Patellofemoral disorders, left knee: Secondary | ICD-10-CM | POA: Diagnosis not present

## 2020-09-11 DIAGNOSIS — M7712 Lateral epicondylitis, left elbow: Secondary | ICD-10-CM | POA: Diagnosis not present

## 2020-09-12 DIAGNOSIS — F5105 Insomnia due to other mental disorder: Secondary | ICD-10-CM | POA: Diagnosis not present

## 2020-09-12 DIAGNOSIS — F411 Generalized anxiety disorder: Secondary | ICD-10-CM | POA: Diagnosis not present

## 2020-09-12 DIAGNOSIS — F330 Major depressive disorder, recurrent, mild: Secondary | ICD-10-CM | POA: Diagnosis not present

## 2020-09-16 ENCOUNTER — Ambulatory Visit (INDEPENDENT_AMBULATORY_CARE_PROVIDER_SITE_OTHER): Payer: BC Managed Care – PPO | Admitting: Psychology

## 2020-09-16 DIAGNOSIS — F411 Generalized anxiety disorder: Secondary | ICD-10-CM | POA: Diagnosis not present

## 2020-09-16 DIAGNOSIS — F331 Major depressive disorder, recurrent, moderate: Secondary | ICD-10-CM

## 2020-10-16 DIAGNOSIS — M7712 Lateral epicondylitis, left elbow: Secondary | ICD-10-CM | POA: Diagnosis not present

## 2020-10-16 DIAGNOSIS — M222X2 Patellofemoral disorders, left knee: Secondary | ICD-10-CM | POA: Diagnosis not present

## 2020-10-16 DIAGNOSIS — M533 Sacrococcygeal disorders, not elsewhere classified: Secondary | ICD-10-CM | POA: Diagnosis not present

## 2020-10-16 DIAGNOSIS — M9903 Segmental and somatic dysfunction of lumbar region: Secondary | ICD-10-CM | POA: Diagnosis not present

## 2020-10-21 ENCOUNTER — Ambulatory Visit: Payer: Self-pay | Admitting: Medical

## 2020-10-21 ENCOUNTER — Encounter: Payer: Self-pay | Admitting: Medical

## 2020-10-21 ENCOUNTER — Other Ambulatory Visit: Payer: Self-pay

## 2020-10-21 VITALS — BP 145/90 | HR 78 | Resp 14

## 2020-10-21 DIAGNOSIS — M79671 Pain in right foot: Secondary | ICD-10-CM

## 2020-10-21 NOTE — Progress Notes (Signed)
Subjective:    Patient ID: Brandon Jackson, male    DOB: September 22, 1981, 39 y.o.   MRN: [id number]  HPI  39 yo male in non acute distress.  Gout flare up  In  October . Colchicine not picked up due to insurance issues.  At this point he had on/off pain in both feet since October. He currently is walking with a limp.Marland Kitchen  History ofplantar fasciitis treatment in 2015 treated both feet with PT L>R. In pain.  Patient using cane to walk due to pain in  Left foot. He feels this pain is not Gout or plantar fasciitis. Pain is located on the top of left foot on between the  1st andf 2nd   Blood pressure (!) 145/90, pulse 78, resp. rate 14, SpO2 100 %.   Allergies  Allergen Reactions  . Wellbutrin [Bupropion] Other (See Comments)    Anxiety increased on med when taken alone.  He can tolerate wellbutrin when taken with lexparo.   . Erythromycin Diarrhea and Rash    Current Outpatient Medications:  .  buPROPion (WELLBUTRIN) 75 MG tablet, Take 1 tablet (75 mg total) by mouth daily., Disp: 90 tablet, Rfl: 1 .  cetirizine (ZYRTEC) 10 MG tablet, Take 1 tablet (10 mg total) by mouth daily., Disp: 30 tablet, Rfl: 0 .  colchicine 0.6 MG tablet, 2 po at onset of pain, may repeat 1 tab in 1 hour if still hurting- no more then 3 tablets in 24 hours, Disp: 12 tablet, Rfl: 1 .  cyclobenzaprine (FLEXERIL) 10 MG tablet, TAKE 1/2 TO 1 TABLET(5 TO 10 MG) BY MOUTH AT BEDTIME AS NEEDED FOR MUSCLE SPASMS, Disp: 30 tablet, Rfl: 0 .  desonide (DESOWEN) 0.05 % cream, Apply topically 2 (two) times daily. Sparingly for 10 days., Disp: 30 g, Rfl: 0 .  escitalopram (LEXAPRO) 20 MG tablet, Take 1 tablet (20 mg total) by mouth daily., Disp: 30 tablet, Rfl: 12 .  Eszopiclone 3 MG TABS, Take 1 tablet by mouth at bedtime as needed., Disp: , Rfl:  .  fluticasone (FLONASE) 50 MCG/ACT nasal spray, Place 1 spray into both nostrils 2 (two) times daily., Disp: 16 g, Rfl: 2 .  ibuprofen (ADVIL,MOTRIN) 200 MG tablet, Take 200 mg by  mouth every 6 (six) hours as needed., Disp: , Rfl:  .  LORazepam (ATIVAN) 0.5 MG tablet, TAKE 1 TABLET BY MOUTH TWICE DAILY AS NEEDED FO ANXIETY, Disp: 30 tablet, Rfl: 2 .  Multiple Vitamin (MULTIVITAMIN) tablet, Take 1 tablet by mouth daily., Disp: , Rfl:  .  sildenafil (REVATIO) 20 MG tablet, Take 3-5 tablets (60-100 mg total) by mouth daily as needed., Disp: 50 tablet, Rfl: 12 .  sodium chloride (OCEAN) 0.65 % SOLN nasal spray, Place 2 sprays into both nostrils every 2 (two) hours while awake for 30 days., Disp: 104 mL, Rfl: 0  Review of Systems  Constitutional: Negative for chills and fever.  Musculoskeletal: Positive for arthralgias (left mid foot).  Skin: Negative for color change, rash and wound.       Objective:   Physical Exam Cardiovascular:     Pulses:          Dorsalis pedis pulses are 2+ on the right side and 2+ on the left side.       Posterior tibial pulses are 2+ on the right side and 2+ on the left side.  Musculoskeletal:     Left foot: Decreased range of motion (secondary to pain). No deformity, bunion or foot drop.  Feet:  Feet:     Right foot:     Skin integrity: Skin integrity normal.     Toenail Condition: Right toenails are normal.     Left foot:     Skin integrity: Skin integrity normal.     Toenail Condition: Left toenails are normal.   mild swelling noted on the dorsum of the left foot, no erythema, no wounds, no rash.        Assessment & Plan:  Foot pain  Left foot pain > right ( no pain today). Will refer to podiatry, he may call clinic to see if he can get in soon. May take  )TC Motrin 800mg  three times a day with food for pain. He declines crutches today and states the cane is helping him walk. Return to the clinic or call if any questions or concerns. He usually walks a lot on campus but classes are out and so he does not have to walk as much now with classes out of session. Patient verbalizes understanding and has no questions at  discharge.

## 2020-10-21 NOTE — Patient Instructions (Signed)

## 2020-10-22 ENCOUNTER — Ambulatory Visit (INDEPENDENT_AMBULATORY_CARE_PROVIDER_SITE_OTHER): Payer: BC Managed Care – PPO

## 2020-10-22 ENCOUNTER — Ambulatory Visit: Payer: BC Managed Care – PPO | Admitting: Podiatry

## 2020-10-22 DIAGNOSIS — M778 Other enthesopathies, not elsewhere classified: Secondary | ICD-10-CM

## 2020-10-22 MED ORDER — METHYLPREDNISOLONE 4 MG PO TBPK: ORAL_TABLET | ORAL | 0 refills | Status: DC

## 2020-10-22 MED ORDER — MELOXICAM 15 MG PO TABS: 15.0000 mg | ORAL_TABLET | Freq: Every day | ORAL | 3 refills | Status: DC

## 2020-10-22 NOTE — Progress Notes (Signed)
Subjective:  Patient ID: Brandon Jackson, male    DOB: October 20, 1981,  MRN: [id number] HPI Chief Complaint  Patient presents with  . Foot Pain    Dorsal forefoot/midfoot left - sudden aching pains initially x 3 weeks, now constant with full weight bearing, radiating pain medially from 1st MPJ, some swelling, tried Tyleno/Advil  . Foot Pain    Lateral foot and some Dorsal forefoot/midfoot right - thought initially had achilles injury, but now pain has moved lateral foot, thinks made him compensate more onto left foot, making pain more severe on the left, tried Tylenol/Advil  . New Patient (Initial Visit)    39 y.o. male presents with the above complaint.   ROS: He denies fever chills nausea vomiting muscle aches pains calf pain back pain chest pain shortness of breath.  Past Medical History:  Diagnosis Date  . Depression   . Frequent headaches   . History of chicken pox   . Migraines   . Ulcer    Past Surgical History:  Procedure Laterality Date  . CYSTECTOMY  2003  . TONSILLECTOMY AND ADENOIDECTOMY  2007  . Wisdom Teeth Removal  1998    Current Outpatient Medications:  .  buPROPion (WELLBUTRIN SR) 150 MG 12 hr tablet, , Disp: , Rfl:  .  cetirizine (ZYRTEC) 10 MG tablet, Take 1 tablet (10 mg total) by mouth daily., Disp: 30 tablet, Rfl: 0 .  colchicine 0.6 MG tablet, 2 po at onset of pain, may repeat 1 tab in 1 hour if still hurting- no more then 3 tablets in 24 hours, Disp: 12 tablet, Rfl: 1 .  cyclobenzaprine (FLEXERIL) 10 MG tablet, TAKE 1/2 TO 1 TABLET(5 TO 10 MG) BY MOUTH AT BEDTIME AS NEEDED FOR MUSCLE SPASMS, Disp: 30 tablet, Rfl: 0 .  desonide (DESOWEN) 0.05 % cream, Apply topically 2 (two) times daily. Sparingly for 10 days., Disp: 30 g, Rfl: 0 .  DULoxetine (CYMBALTA) 30 MG capsule, Take 30 mg by mouth every morning., Disp: , Rfl:  .  DULoxetine (CYMBALTA) 60 MG capsule, Take 60 mg by mouth every morning., Disp: , Rfl:  .  escitalopram (LEXAPRO) 20 MG tablet, Take 1  tablet (20 mg total) by mouth daily., Disp: 30 tablet, Rfl: 12 .  Eszopiclone 3 MG TABS, Take 1 tablet by mouth at bedtime as needed., Disp: , Rfl:  .  fluticasone (FLONASE) 50 MCG/ACT nasal spray, Place 1 spray into both nostrils 2 (two) times daily., Disp: 16 g, Rfl: 2 .  ibuprofen (ADVIL,MOTRIN) 200 MG tablet, Take 200 mg by mouth every 6 (six) hours as needed., Disp: , Rfl:  .  LORazepam (ATIVAN) 0.5 MG tablet, TAKE 1 TABLET BY MOUTH TWICE DAILY AS NEEDED FO ANXIETY, Disp: 30 tablet, Rfl: 2 .  meloxicam (MOBIC) 15 MG tablet, Take 1 tablet (15 mg total) by mouth daily., Disp: 30 tablet, Rfl: 3 .  methylPREDNISolone (MEDROL DOSEPAK) 4 MG TBPK tablet, 6 day dose pack - take as directed, Disp: 21 tablet, Rfl: 0 .  Multiple Vitamin (MULTIVITAMIN) tablet, Take 1 tablet by mouth daily., Disp: , Rfl:  .  sildenafil (REVATIO) 20 MG tablet, Take 3-5 tablets (60-100 mg total) by mouth daily as needed., Disp: 50 tablet, Rfl: 12 .  sodium chloride (OCEAN) 0.65 % SOLN nasal spray, Place 2 sprays into both nostrils every 2 (two) hours while awake for 30 days., Disp: 104 mL, Rfl: 0 .  venlafaxine XR (EFFEXOR-XR) 75 MG 24 hr capsule, Take 75 mg by mouth daily.,  Disp: , Rfl:   Allergies  Allergen Reactions  . Wellbutrin [Bupropion] Other (See Comments)    Anxiety increased on med when taken alone.  He can tolerate wellbutrin when taken with lexparo.   . Erythromycin Diarrhea and Rash   Review of Systems Objective:  There were no vitals filed for this visit.  General: Well developed, nourished, in no acute distress, alert and oriented x3   Dermatological: Skin is warm, dry and supple bilateral. Nails x 10 are well maintained; remaining integument appears unremarkable at this time. There are no open sores, no preulcerative lesions, no rash or signs of infection present.  Vascular: Dorsalis Pedis artery and Posterior Tibial artery pedal pulses are 2/4 bilateral with immedate capillary fill time. Pedal hair  growth present. No varicosities and no lower extremity edema present bilateral.   Neruologic: Grossly intact via light touch bilateral. Vibratory intact via tuning fork bilateral. Protective threshold with Semmes Wienstein monofilament intact to all pedal sites bilateral. Patellar and Achilles deep tendon reflexes 2+ bilateral. No Babinski or clonus noted bilateral.   Musculoskeletal: No gross boney pedal deformities bilateral. No pain, crepitus, or limitation noted with foot and ankle range of motion bilateral. Muscular strength 5/5 in all groups tested bilateral.  He has pain on palpation of the midfoot with frontal plane range of motion there is pain in the midfoot as well as the dorsal and plantar.  No pain on palpation of the metatarsals themselves but with great range of motion of the metatarsals he does have pain in the midfoot.  Gait: Unassisted, Nonantalgic.    Radiographs:  Radiographs taken today do not demonstrate any type of osseous abnormalities no acute findings are noted.  No swelling no fractures.  Assessment & Plan:   Assessment: Most likely it is a sprain but cannot rule out arthropathies.  Plan: At this point requesting a complete metabolic panel as well as an arthritic profile.  Should blood work come back abnormal notify me immediately.  Otherwise along with start him on a Medrol Dosepak to be followed by meloxicam.     Ona Roehrs T. [place], [place]

## 2020-10-23 LAB — COMPREHENSIVE METABOLIC PANEL
ALT: 31 IU/L (ref 0–44)
AST: 21 IU/L (ref 0–40)
Albumin/Globulin Ratio: 1.7 (ref 1.2–2.2)
Albumin: 4.3 g/dL (ref 4.0–5.0)
Alkaline Phosphatase: 71 IU/L (ref 44–121)
BUN/Creatinine Ratio: 9 (ref 9–20)
BUN: 9 mg/dL (ref 6–24)
Bilirubin Total: 0.3 mg/dL (ref 0.0–1.2)
CO2: 22 mmol/L (ref 20–29)
Calcium: 9.5 mg/dL (ref 8.7–10.2)
Chloride: 101 mmol/L (ref 96–106)
Creatinine, Ser: 0.96 mg/dL (ref 0.76–1.27)
GFR calc Af Amer: 114 mL/min/{1.73_m2} (ref >59)
GFR calc non Af Amer: 98 mL/min/{1.73_m2} (ref >59)
Globulin, Total: 2.6 g/dL (ref 1.5–4.5)
Glucose: 77 mg/dL (ref 65–99)
Potassium: 5 mmol/L (ref 3.5–5.2)
Sodium: 140 mmol/L (ref 134–144)
Total Protein: 6.9 g/dL (ref 6.0–8.5)

## 2020-10-23 LAB — C-REACTIVE PROTEIN: CRP: 18 mg/L — ABNORMAL HIGH (ref 0–10)

## 2020-10-23 LAB — URIC ACID: Uric Acid: 7.1 mg/dL (ref 3.8–8.4)

## 2020-10-23 LAB — RHEUMATOID FACTOR: Rheumatoid fact SerPl-aCnc: <10 IU/mL (ref <14.0)

## 2020-10-23 LAB — ANA: Anti Nuclear Antibody (ANA): NEGATIVE

## 2020-10-23 LAB — SEDIMENTATION RATE: Sed Rate: 20 mm/hr — ABNORMAL HIGH (ref 0–15)

## 2020-10-24 ENCOUNTER — Telehealth: Payer: Self-pay

## 2020-10-24 NOTE — Telephone Encounter (Signed)
-----   Message from Elinor Parkinson, [place] sent at 10/23/2020  4:35 PM EST ----- Blood work looks good. General inflammation nothing specific.

## 2020-10-24 NOTE — Telephone Encounter (Signed)
Patient notified of results via voice mail °

## 2020-11-05 ENCOUNTER — Ambulatory Visit: Payer: BC Managed Care – PPO | Admitting: Podiatry

## 2020-11-05 ENCOUNTER — Other Ambulatory Visit: Payer: Self-pay

## 2020-11-05 DIAGNOSIS — M778 Other enthesopathies, not elsewhere classified: Secondary | ICD-10-CM | POA: Diagnosis not present

## 2020-11-05 MED ORDER — INDOMETHACIN 50 MG PO CAPS: 50.0000 mg | ORAL_CAPSULE | Freq: Two times a day (BID) | ORAL | 1 refills | Status: DC

## 2020-11-05 NOTE — Progress Notes (Signed)
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  He states today is the first day where there is not even inkling of tenderness.  He states that it has gotten better from the first day he started his medication.  He states that it has improved consistently.  He has had no relapses.  Does relate a history of gout from his father and he states that he has had it himself previously.  Objective: Vital signs are stable he is alert oriented x3.  Pulses are palpable.  And currently there is no reproducible pain.  No areas of erythema cellulitis drainage or odor.  I did review his labs today which were consistent with inflammatory markers.  Though his uric acid after 3week run had come down to a 7.1 more than likely this was higher resulting in a real-life gout attack.  Assessment: Probable gout attack 95% likelihood however I am concerned this happening again.  Plan: I started him on indomethacin should this happen again he will start taking the indomethacin.  At least this will help him with inflammation and the soreness.  He is to then obtain a requisition from [place] for an arthritic panel including uric acid.  I will follow-up with him immediately following that.

## 2020-11-20 DIAGNOSIS — M222X2 Patellofemoral disorders, left knee: Secondary | ICD-10-CM | POA: Diagnosis not present

## 2020-11-20 DIAGNOSIS — M9903 Segmental and somatic dysfunction of lumbar region: Secondary | ICD-10-CM | POA: Diagnosis not present

## 2020-11-20 DIAGNOSIS — M7712 Lateral epicondylitis, left elbow: Secondary | ICD-10-CM | POA: Diagnosis not present

## 2020-11-20 DIAGNOSIS — M533 Sacrococcygeal disorders, not elsewhere classified: Secondary | ICD-10-CM | POA: Diagnosis not present

## 2020-11-28 DIAGNOSIS — F330 Major depressive disorder, recurrent, mild: Secondary | ICD-10-CM | POA: Diagnosis not present

## 2020-11-28 DIAGNOSIS — F411 Generalized anxiety disorder: Secondary | ICD-10-CM | POA: Diagnosis not present

## 2020-11-28 DIAGNOSIS — F5105 Insomnia due to other mental disorder: Secondary | ICD-10-CM | POA: Diagnosis not present

## 2020-12-01 ENCOUNTER — Ambulatory Visit (INDEPENDENT_AMBULATORY_CARE_PROVIDER_SITE_OTHER): Payer: BC Managed Care – PPO | Admitting: Psychology

## 2020-12-01 ENCOUNTER — Telehealth: Payer: BC Managed Care – PPO | Admitting: Emergency Medicine

## 2020-12-01 DIAGNOSIS — F331 Major depressive disorder, recurrent, moderate: Secondary | ICD-10-CM | POA: Diagnosis not present

## 2020-12-01 DIAGNOSIS — B001 Herpesviral vesicular dermatitis: Secondary | ICD-10-CM

## 2020-12-01 DIAGNOSIS — F411 Generalized anxiety disorder: Secondary | ICD-10-CM | POA: Diagnosis not present

## 2020-12-01 MED ORDER — VALACYCLOVIR HCL 1 G PO TABS: 1000.0000 mg | ORAL_TABLET | Freq: Two times a day (BID) | ORAL | 0 refills | Status: DC

## 2020-12-01 NOTE — Progress Notes (Signed)
We are sorry that you are not feeling well.  Here is how we plan to help!  Based on what you have shared with me it does look like you have a viral infection.    Most cold sores or fever blisters are small fluid filled blisters around the mouth caused by herpes simplex virus.  The most common strain of the virus causing cold sores is herpes simplex virus 1.  It can be spread by skin contact, sharing eating utensils, or even sharing towels.  Cold sores are contagious to other people until dry. (Approximately 5-7 days).  Wash your hands. You can spread the virus to your eyes through handling your contact lenses after touching the lesions.  Most people experience pain at the sight or tingling sensations in their lips that may begin before the ulcers erupt.  Herpes simplex is treatable but not curable.  It may lie dormant for a long time and then reappear due to stress or prolonged sun exposure.  Many patients have success in treating their cold sores with an over the counter topical called Abreva.  You may apply the cream up to 5 times daily (maximum 10 days) until healing occurs.  If you would like to use an oral antiviral medication to speed the healing of your cold sore, I have sent a prescription to your local pharmacy Valacyclovir 2 gm take one by mouth twice a day for 1 day    HOME CARE:  Wash your hands frequently. Do not pick at or rub the sore. Don't open the blisters. Avoid kissing other people during this time. Avoid sharing drinking glasses, eating utensils, or razors. Do not handle contact lenses unless you have thoroughly washed your hands with soap and warm water! Avoid oral sex during this time.  Herpes from sores on your mouth can spread to your partner's genital area. Avoid contact with anyone who has eczema or a weakened immune system. Cold sores are often triggered by exposure to intense sunlight, use a lip balm containing a sunscreen (SPF 30 or higher).  GET HELP RIGHT AWAY  IF:  Blisters look infected. Blisters occur near or in the eye. Symptoms last longer than 10 days. Your symptoms become worse.  MAKE SURE YOU:  Understand these instructions. Will watch your condition. Will get help right away if you are not doing well or get worse.    Your e-visit answers were reviewed by a board certified advanced clinical practitioner to complete your personal care plan.  Depending upon the condition, your plan could have  Included both over the counter or prescription medications.    Please review your pharmacy choice.  Be sure that the pharmacy you have chosen is open so that you can pick up your prescription now.  If there is a problem you can message your provider in MyChart to have the prescription routed to another pharmacy.    Your safety is important to us.  If you have drug allergies check our prescription carefully.  For the next 24 hours you can use MyChart to ask questions about today's visit, request a non-urgent call back, or ask for a work or school excuse from your e-visit provider.  You will get an email in the next two days asking about your experience.  I hope that your e-visit has been valuable and will speed your recovery.  Approximately 5 minutes was used in reviewing the patient's chart, questionnaire, prescribing medications, and documentation.    reviewing the patient's chart, questionnaire, prescribing medications, and documentation.

## 2020-12-24 ENCOUNTER — Telehealth: Payer: Self-pay

## 2020-12-24 DIAGNOSIS — M1000 Idiopathic gout, unspecified site: Secondary | ICD-10-CM

## 2020-12-24 NOTE — Telephone Encounter (Signed)
Patient called stated that he thinks he is having a gout flare up again and wanted to know if he could get labs done.   Per Dr. Geryl Rankins last office note, lab requisition has been printed and faxed to Patient at [phone].

## 2020-12-26 ENCOUNTER — Other Ambulatory Visit: Payer: BC Managed Care – PPO

## 2020-12-26 ENCOUNTER — Other Ambulatory Visit: Payer: Self-pay

## 2020-12-26 DIAGNOSIS — M1000 Idiopathic gout, unspecified site: Secondary | ICD-10-CM

## 2020-12-28 LAB — SPECIMEN STATUS REPORT

## 2020-12-29 ENCOUNTER — Other Ambulatory Visit: Payer: Self-pay | Admitting: Podiatry

## 2020-12-29 ENCOUNTER — Encounter: Payer: Self-pay | Admitting: Podiatry

## 2020-12-29 ENCOUNTER — Other Ambulatory Visit: Payer: Self-pay

## 2020-12-29 LAB — ARTHRITIS PANEL
Anti Nuclear Antibody (ANA): NEGATIVE
Rheumatoid fact SerPl-aCnc: <10 IU/mL (ref <14.0)
Uric Acid: 8.4 mg/dL (ref 3.8–8.4)

## 2020-12-29 MED ORDER — ALLOPURINOL 100 MG PO TABS: 100.0000 mg | ORAL_TABLET | Freq: Every day | ORAL | 6 refills | Status: DC

## 2020-12-29 MED ORDER — METHYLPREDNISOLONE 4 MG PO TBPK: ORAL_TABLET | ORAL | 0 refills | Status: DC

## 2021-01-02 ENCOUNTER — Ambulatory Visit
Admission: RE | Admit: 2021-01-02 | Discharge: 2021-01-02 | Disposition: A | Discharge status: Home / Self Care | Payer: BC Managed Care – PPO | Source: Ambulatory Visit | Attending: Family Medicine | Admitting: Family Medicine

## 2021-01-02 ENCOUNTER — Other Ambulatory Visit: Payer: Self-pay

## 2021-01-02 VITALS — BP 112/80 | HR 71 | Temp 97.7°F | Resp 20

## 2021-01-02 DIAGNOSIS — N50811 Right testicular pain: Secondary | ICD-10-CM | POA: Diagnosis not present

## 2021-01-02 LAB — POCT URINALYSIS DIP (MANUAL ENTRY)
Bilirubin, UA: NEGATIVE
Blood, UA: NEGATIVE
Glucose, UA: NEGATIVE mg/dL
Ketones, POC UA: NEGATIVE mg/dL
Leukocytes, UA: NEGATIVE
Nitrite, UA: NEGATIVE
Protein Ur, POC: NEGATIVE mg/dL
Spec Grav, UA: <=1.005 — AB (ref 1.010–1.025)
Urobilinogen, UA: 0.2 E.U./dL
pH, UA: 6.5 (ref 5.0–8.0)

## 2021-01-02 MED ORDER — CIPROFLOXACIN HCL 500 MG PO TABS: 500.0000 mg | ORAL_TABLET | Freq: Two times a day (BID) | ORAL | 0 refills | Status: DC

## 2021-01-02 NOTE — ED Triage Notes (Signed)
Pt here for right testicle pain starting last night; pt sts hx of same in past with infection

## 2021-02-02 ENCOUNTER — Ambulatory Visit: Payer: BC Managed Care – PPO | Admitting: Podiatry

## 2021-02-02 ENCOUNTER — Other Ambulatory Visit: Payer: Self-pay

## 2021-02-02 ENCOUNTER — Ambulatory Visit (INDEPENDENT_AMBULATORY_CARE_PROVIDER_SITE_OTHER): Payer: BC Managed Care – PPO | Admitting: Psychology

## 2021-02-02 DIAGNOSIS — M1000 Idiopathic gout, unspecified site: Secondary | ICD-10-CM | POA: Diagnosis not present

## 2021-02-02 DIAGNOSIS — F411 Generalized anxiety disorder: Secondary | ICD-10-CM | POA: Diagnosis not present

## 2021-02-02 DIAGNOSIS — F331 Major depressive disorder, recurrent, moderate: Secondary | ICD-10-CM | POA: Diagnosis not present

## 2021-02-02 NOTE — Progress Notes (Signed)
He presents today for his bilateral gout states that the flareups have not been present he states that he at least can walk now on his feet it without that nagging pain and has been taking his allopurinol regularly.  Objective: Vital signs are stable alert oriented x3 there is no erythema cellulitis drainage or odor  Assessment gouty capsulitis resolving with long-term treatment of gout with allopurinol.  Plan: At this point I recommended that he continue the use of the allopurinol daily but we are going to request a CMP and a uric acid level.  Once this comes back I will inform him of any changes to be made to his medication regimen.

## 2021-02-10 ENCOUNTER — Other Ambulatory Visit: Payer: BC Managed Care – PPO

## 2021-02-10 ENCOUNTER — Other Ambulatory Visit: Payer: Self-pay

## 2021-02-10 DIAGNOSIS — M1000 Idiopathic gout, unspecified site: Secondary | ICD-10-CM

## 2021-02-11 LAB — COMPREHENSIVE METABOLIC PANEL
ALT: 39 IU/L (ref 0–44)
AST: 23 IU/L (ref 0–40)
Albumin/Globulin Ratio: 2 (ref 1.2–2.2)
Albumin: 4.5 g/dL (ref 4.0–5.0)
Alkaline Phosphatase: 55 IU/L (ref 44–121)
BUN/Creatinine Ratio: 16 (ref 9–20)
BUN: 16 mg/dL (ref 6–24)
Bilirubin Total: 0.3 mg/dL (ref 0.0–1.2)
CO2: 22 mmol/L (ref 20–29)
Calcium: 9.4 mg/dL (ref 8.7–10.2)
Chloride: 103 mmol/L (ref 96–106)
Creatinine, Ser: 1.02 mg/dL (ref 0.76–1.27)
Globulin, Total: 2.2 g/dL (ref 1.5–4.5)
Glucose: 103 mg/dL — ABNORMAL HIGH (ref 65–99)
Potassium: 4.6 mmol/L (ref 3.5–5.2)
Sodium: 140 mmol/L (ref 134–144)
Total Protein: 6.7 g/dL (ref 6.0–8.5)
eGFR: 95 mL/min/{1.73_m2} (ref >59)

## 2021-02-11 LAB — SEDIMENTATION RATE: Sed Rate: 16 mm/hr — ABNORMAL HIGH (ref 0–15)

## 2021-02-11 LAB — URIC ACID: Uric Acid: 8.7 mg/dL — ABNORMAL HIGH (ref 3.8–8.4)

## 2021-02-25 DIAGNOSIS — F330 Major depressive disorder, recurrent, mild: Secondary | ICD-10-CM | POA: Diagnosis not present

## 2021-02-25 DIAGNOSIS — F5105 Insomnia due to other mental disorder: Secondary | ICD-10-CM | POA: Diagnosis not present

## 2021-02-25 DIAGNOSIS — F411 Generalized anxiety disorder: Secondary | ICD-10-CM | POA: Diagnosis not present

## 2021-03-12 ENCOUNTER — Telehealth: Payer: Self-pay

## 2021-03-12 NOTE — Telephone Encounter (Signed)
I spoke with patient regarding his results.  He said he was doing ok, he feels the pain every now and then.  I informed him per Dr. Geryl Rankins instructions to start Allopurinol twice daily to see if it helps.  He verbalized instructions.  He stated that he had enough pill at this time, but if he needs a refill, he will call to refill for twice daily.  He will also follow up as needed

## 2021-03-12 NOTE — Telephone Encounter (Signed)
-----   Message from Elinor Parkinson, [place] sent at 02/11/2021 12:11 PM EDT ----- His uric acid is still increased so  needs to increase allopurinol.  100mg  bid

## 2021-03-19 ENCOUNTER — Telehealth: Payer: Self-pay | Admitting: *Deleted

## 2021-03-19 MED ORDER — METHYLPREDNISOLONE 4 MG PO TBPK: ORAL_TABLET | ORAL | 0 refills | Status: DC

## 2021-03-19 NOTE — Telephone Encounter (Signed)
Rx for methylprednisolone was sent [facility] [place] on [address] for patient

## 2021-03-19 NOTE — Telephone Encounter (Signed)
"  I'm having a Gout flare-up again.  I was told to call if that happens.  Can I get a round of steroids called in or is there any other suggestions?  I doubled up on the Allopurinol last week as he suggested."  I'll send Dr. Al Corpus your message.  You should get a call back from Dr. Geryl Rankins assistant, Morrie Sheldon.

## 2021-03-19 NOTE — Addendum Note (Signed)
Addended by: Kristian Covey on: 03/19/2021 09:28 AM   Modules accepted: Orders

## 2021-04-19 ENCOUNTER — Encounter: Payer: Self-pay | Admitting: Podiatry

## 2021-04-23 MED ORDER — ALLOPURINOL 100 MG PO TABS
100.0000 mg | ORAL_TABLET | Freq: Two times a day (BID) | ORAL | 6 refills | Status: DC
Start: 2021-04-23 — End: 2021-11-19

## 2021-05-04 ENCOUNTER — Encounter: Payer: Self-pay | Admitting: Family Medicine

## 2021-05-04 ENCOUNTER — Other Ambulatory Visit: Payer: Self-pay

## 2021-05-04 ENCOUNTER — Ambulatory Visit: Payer: BC Managed Care – PPO | Admitting: Family Medicine

## 2021-05-04 ENCOUNTER — Ambulatory Visit (INDEPENDENT_AMBULATORY_CARE_PROVIDER_SITE_OTHER): Payer: BC Managed Care – PPO | Admitting: Psychology

## 2021-05-04 DIAGNOSIS — F331 Major depressive disorder, recurrent, moderate: Secondary | ICD-10-CM

## 2021-05-04 DIAGNOSIS — F411 Generalized anxiety disorder: Secondary | ICD-10-CM

## 2021-05-04 DIAGNOSIS — F339 Major depressive disorder, recurrent, unspecified: Secondary | ICD-10-CM

## 2021-05-04 MED ORDER — VENLAFAXINE HCL ER 37.5 MG PO CP24: 37.5000 mg | ORAL_CAPSULE | Freq: Every day | ORAL | 2 refills | Status: DC

## 2021-05-04 MED ORDER — BUPROPION HCL ER (SR) 150 MG PO TB12: 150.0000 mg | ORAL_TABLET | Freq: Two times a day (BID) | ORAL | Status: DC

## 2021-05-04 NOTE — Patient Instructions (Signed)
Decrease venlafaxine to 37.5mg  a day.   Try that for about 2 weeks.   If tolerated but still having sweats, then try taking it every other day for about 2 weeks.  Then stop if tolerated.   If mood is worse in the meantime, then let me know.  Either update, update me in about month.   Take care.  Glad to see you.

## 2021-05-04 NOTE — Progress Notes (Signed)
This visit occurred during the SARS-CoV-2 public health emergency.  Safety protocols were in place, including screening questions prior to the visit, additional usage of staff PPE, and extensive cleaning of exam room while observing appropriate contact time as indicated for disinfecting solutions.  He had seen psychiatry in the meantime.  His situation is improved overall with work.  BP is controlled today.  No SI/HI.  Home situation is good.  He was asking about trial of lowering his meds since his situation is better.  He is still in counseling.  When he had trouble with anxiety on wellbutrin alone prev, his job situation was worse.    We talked about side effects from venlafaxine with dysuria, sweating and urinary sx.   He was asking about transferring psych care here.  This is reasonable.  He has social support.    Meds, vitals, and allergies reviewed.  ROS: Per HPI unless specifically indicated in ROS section   GEN: nad, alert and oriented, affect and speech normal. HEENT: ncat NECK: supple w/o LA CV: rrr.  PULM: ctab, no inc wob ABD: soft, +bs EXT: no edema SKIN: Well-perfused

## 2021-05-06 NOTE — Assessment & Plan Note (Addendum)
Okay for outpatient follow-up.  He is improved overall, compared to previous.  He is going to continue with counseling.  No suicidal homicidal intent.  We talked about potential side effects from venlafaxine, especially with increased sweating.  He can cut back to 37.5 mg a day and let me know how he is doing.  See after visit summary.  He wanted to transfer his psychiatry care here and I think this is reasonable for now.  If needed he can taper his venlafaxine by taking it every other day and then stopping it.  See after visit summary.  Discussed.  I will await update from patient.

## 2021-07-06 ENCOUNTER — Ambulatory Visit (INDEPENDENT_AMBULATORY_CARE_PROVIDER_SITE_OTHER): Payer: BC Managed Care – PPO | Admitting: Psychology

## 2021-07-06 DIAGNOSIS — F331 Major depressive disorder, recurrent, moderate: Secondary | ICD-10-CM | POA: Diagnosis not present

## 2021-07-06 DIAGNOSIS — F411 Generalized anxiety disorder: Secondary | ICD-10-CM | POA: Diagnosis not present

## 2021-07-08 ENCOUNTER — Encounter: Payer: Self-pay | Admitting: Family Medicine

## 2021-08-06 ENCOUNTER — Other Ambulatory Visit: Payer: Self-pay

## 2021-08-06 ENCOUNTER — Encounter: Payer: Self-pay | Admitting: Family Medicine

## 2021-08-06 ENCOUNTER — Ambulatory Visit: Payer: BC Managed Care – PPO | Admitting: Family Medicine

## 2021-08-06 DIAGNOSIS — F339 Major depressive disorder, recurrent, unspecified: Secondary | ICD-10-CM

## 2021-08-06 MED ORDER — DESVENLAFAXINE SUCCINATE ER 50 MG PO TB24: 50.0000 mg | ORAL_TABLET | Freq: Every day | ORAL | Status: DC

## 2021-08-06 MED ORDER — BUSPIRONE HCL 5 MG PO TABS: 5.0000 mg | ORAL_TABLET | Freq: Two times a day (BID) | ORAL | 1 refills | Status: DC

## 2021-08-06 MED ORDER — ESZOPICLONE 3 MG PO TABS: 3.0000 mg | ORAL_TABLET | Freq: Every evening | ORAL | 2 refills | Status: DC | PRN

## 2021-08-06 MED ORDER — BUSPIRONE HCL 5 MG PO TABS: 5.0000 mg | ORAL_TABLET | Freq: Two times a day (BID) | ORAL | Status: DC

## 2021-08-06 NOTE — Progress Notes (Signed)
This visit occurred during the SARS-CoV-2 public health emergency.  Safety protocols were in place, including screening questions prior to the visit, additional usage of staff PPE, and extensive cleaning of exam room while observing appropriate contact time as indicated for disinfecting solutions.  Mood discussed with patient. Still meeting with [name] with counselor and that helps.  Currently on wellbutrin BID and off other meds (off effexor and off lexapro).  Mood d/w pt.  "It's depression."  He feels physically better about being on less meds but his mood is worse.  He is being moved between departments at work and that is significant upheaval.  He was hired in the [employer] but will be also under [employer] also.  He is meeting his new supervisor next week.  He didn't ask for this change.    Less hopeful, more of a gloomy outlook in general.  Home situation is still okay.  He doesn't currently see the payoff for his hard work and his career.  He had thoughts of self harm last week but didn't follow through with that.  No SI now.  Contracts for safety.  He doesn't want to put others in a difficult situation through self harm.    No etoh, no illicits.  We talked about trying to limit side effects from meds.  He had been on medication for years and he wants to limit sexual side effects.    Had used mult SSRIs, wellbutrin, effexor, cymbalta.    Need refill on med for insomnia.  It helped prev.  See orders.  Reasonable to continue eszopiclone for as needed use.  Meds, vitals, and allergies reviewed.   ROS: Per HPI unless specifically indicated in ROS section   GEN: nad, alert and oriented, speech and judgment intact. HEENT: ncat NECK: supple w/o LA CV: rrr PULM: ctab, no inc wob ABD: soft, +bs EXT: no edema SKIN: Well-perfused.  No tremor 30 minutes were devoted to patient care in this encounter (this includes time spent reviewing the patient's  file/history, interviewing and examining the patient, counseling/reviewing plan with patient).

## 2021-08-06 NOTE — Patient Instructions (Signed)
Start buspar and update me in about 1 week, sooner if needed.  Keep taking wellbutrin.  Take care.  Glad to see you. Thanks for your effort.  Keep going with counseling.

## 2021-08-09 NOTE — Assessment & Plan Note (Signed)
Discussed options.  Contracts for safety.  No suicidal or homicidal intent Reasonable to start buspar and update me in about 1 week, sooner if needed.  Keep taking wellbutrin.  I thanked him for your effort with counseling and advised him to continue with counseling.  He agrees with plan.

## 2021-08-13 ENCOUNTER — Other Ambulatory Visit: Payer: Self-pay | Admitting: Family Medicine

## 2021-08-13 ENCOUNTER — Encounter: Payer: Self-pay | Admitting: Family Medicine

## 2021-08-13 MED ORDER — HYDROXYZINE HCL 10 MG PO TABS: 10.0000 mg | ORAL_TABLET | Freq: Three times a day (TID) | ORAL | 1 refills | Status: DC | PRN

## 2021-08-18 ENCOUNTER — Ambulatory Visit: Payer: BC Managed Care – PPO | Admitting: Psychology

## 2021-08-18 DIAGNOSIS — F411 Generalized anxiety disorder: Secondary | ICD-10-CM

## 2021-08-18 DIAGNOSIS — F331 Major depressive disorder, recurrent, moderate: Secondary | ICD-10-CM | POA: Diagnosis not present

## 2021-08-19 ENCOUNTER — Ambulatory Visit (INDEPENDENT_AMBULATORY_CARE_PROVIDER_SITE_OTHER): Payer: BC Managed Care – PPO | Admitting: Psychology

## 2021-08-19 DIAGNOSIS — F411 Generalized anxiety disorder: Secondary | ICD-10-CM

## 2021-08-19 DIAGNOSIS — F331 Major depressive disorder, recurrent, moderate: Secondary | ICD-10-CM

## 2021-08-20 ENCOUNTER — Ambulatory Visit (INDEPENDENT_AMBULATORY_CARE_PROVIDER_SITE_OTHER): Payer: BC Managed Care – PPO | Admitting: Psychology

## 2021-08-20 DIAGNOSIS — F331 Major depressive disorder, recurrent, moderate: Secondary | ICD-10-CM | POA: Diagnosis not present

## 2021-08-20 DIAGNOSIS — F411 Generalized anxiety disorder: Secondary | ICD-10-CM | POA: Diagnosis not present

## 2021-08-24 NOTE — Telephone Encounter (Signed)
Please see about getting him scheduled.  Thanks.

## 2021-08-24 NOTE — Telephone Encounter (Signed)
LMTCB to schedule appt

## 2021-08-26 ENCOUNTER — Other Ambulatory Visit: Payer: Self-pay

## 2021-08-26 ENCOUNTER — Ambulatory Visit: Payer: BC Managed Care – PPO

## 2021-08-26 DIAGNOSIS — Z23 Encounter for immunization: Secondary | ICD-10-CM

## 2021-08-27 ENCOUNTER — Encounter: Payer: Self-pay | Admitting: Family Medicine

## 2021-08-28 ENCOUNTER — Ambulatory Visit: Payer: BC Managed Care – PPO | Admitting: Family Medicine

## 2021-08-31 ENCOUNTER — Other Ambulatory Visit: Payer: Self-pay | Admitting: Family Medicine

## 2021-08-31 MED ORDER — ARIPIPRAZOLE 2 MG PO TABS: 2.0000 mg | ORAL_TABLET | Freq: Every day | ORAL | Status: DC

## 2021-09-01 ENCOUNTER — Other Ambulatory Visit: Payer: Self-pay

## 2021-09-01 ENCOUNTER — Ambulatory Visit (INDEPENDENT_AMBULATORY_CARE_PROVIDER_SITE_OTHER): Payer: BC Managed Care – PPO | Admitting: Family Medicine

## 2021-09-01 ENCOUNTER — Encounter: Payer: Self-pay | Admitting: Family Medicine

## 2021-09-01 VITALS — BP 106/78 | HR 82 | Temp 98.2°F | Ht 67.0 in | Wt 233.0 lb

## 2021-09-01 DIAGNOSIS — F339 Major depressive disorder, recurrent, unspecified: Secondary | ICD-10-CM

## 2021-09-01 MED ORDER — CLONAZEPAM 0.5 MG PO TABS: ORAL_TABLET | ORAL | 1 refills | Status: DC

## 2021-09-01 NOTE — Patient Instructions (Signed)
Stop hydroxyzine and lunestra.  Use klonopin and I'll check on psychiatry options in the meantime.  Please update me as in a few days, sooner if needed.  Take care.  Glad to see you.

## 2021-09-01 NOTE — Progress Notes (Signed)
This visit occurred during the SARS-CoV-2 public health emergency.  Safety protocols were in place, including screening questions prior to the visit, additional usage of staff PPE, and extensive cleaning of exam room while observing appropriate contact time as indicated for disinfecting solutions.  Mood f/u.  Minimal help from PRN hydroxyzine.  Buspar "is probably better than nothing" but his situation at work is more challenging and that complicates the situation.  PHQ and GAD scoring d/w pt.    He prev had a panic attack, actually woke up from a nightmare but then the panic continued during the day.  This was 2 days ago, he couldn't sleep from worry.  He was clearly fatigued after that.  He didn't have a manic episode, d/w pt.    He is stuck in the middle of a reorganization at work and that is challenging, d/w pt.  This was a nonvoluntary change and this isn't the same job he applied for in the first place.  He is still in counseling.  No SI/HI.  No illicts.  No hallucinations.    He had mult med intolerances (effexor, cymbata, lexapro/SSRI).    We talked about seeing psychiatry and it would make sense to get him set up in [place].  He has been taking lunesta at night.  Minimal etoh.    Meds, vitals, and allergies reviewed.   ROS: Per HPI unless specifically indicated in ROS section   GEN: nad, alert and oriented, speech and judgment appear normal.  Normal grooming.  Pleasant conversation. HEENT: ncat NECK: supple w/o LA CV: rrr.  PULM: ctab, no inc wob ABD: soft, +bs EXT: no edema SKIN: Well-perfused.  32 minutes were devoted to patient care in this encounter (this includes time spent reviewing the patient's file/history, interviewing and examining the patient, counseling/reviewing plan with patient).

## 2021-09-02 NOTE — Assessment & Plan Note (Signed)
With concurrent anxiety related to work of people.  We talked about options.  No suicidal intent.  He contract for safety.  No homicidal intent.  Okay for outpatient follow-up In discussion with patient, treatment of anxiety would like be most beneficial.  We talked about options, including increasing buspar vs short term trial of BZD vs trial of abilify.  We agreed to stop Lunesta, stop hydroxyzine, use a low-dose of clonazepam if needed for anxiety or insomnia, and continue Wellbutrin/BuSpar.  We talked about getting patient set up with psychiatry in [place].  Referral placed.  He agrees with plan.  See after visit summary.

## 2021-09-04 ENCOUNTER — Ambulatory Visit (INDEPENDENT_AMBULATORY_CARE_PROVIDER_SITE_OTHER): Payer: BC Managed Care – PPO | Admitting: Psychology

## 2021-09-04 DIAGNOSIS — F411 Generalized anxiety disorder: Secondary | ICD-10-CM | POA: Diagnosis not present

## 2021-09-04 DIAGNOSIS — F331 Major depressive disorder, recurrent, moderate: Secondary | ICD-10-CM | POA: Diagnosis not present

## 2021-09-11 ENCOUNTER — Encounter: Payer: Self-pay | Admitting: Family Medicine

## 2021-09-17 ENCOUNTER — Ambulatory Visit: Payer: BC Managed Care – PPO | Admitting: Psychology

## 2021-09-17 DIAGNOSIS — F331 Major depressive disorder, recurrent, moderate: Secondary | ICD-10-CM | POA: Diagnosis not present

## 2021-09-17 DIAGNOSIS — F411 Generalized anxiety disorder: Secondary | ICD-10-CM

## 2021-09-23 ENCOUNTER — Other Ambulatory Visit: Payer: Self-pay | Admitting: Family Medicine

## 2021-10-09 ENCOUNTER — Telehealth: Payer: BC Managed Care – PPO | Admitting: Family Medicine

## 2021-10-09 ENCOUNTER — Ambulatory Visit: Payer: BC Managed Care – PPO | Admitting: Psychology

## 2021-10-12 ENCOUNTER — Other Ambulatory Visit: Payer: Self-pay

## 2021-10-12 ENCOUNTER — Ambulatory Visit: Payer: BC Managed Care – PPO | Admitting: Family Medicine

## 2021-10-12 ENCOUNTER — Encounter: Payer: Self-pay | Admitting: Family Medicine

## 2021-10-12 VITALS — BP 134/82 | HR 79 | Temp 98.1°F | Ht 67.0 in | Wt 230.0 lb

## 2021-10-12 DIAGNOSIS — Z7189 Other specified counseling: Secondary | ICD-10-CM

## 2021-10-12 DIAGNOSIS — F339 Major depressive disorder, recurrent, unspecified: Secondary | ICD-10-CM | POA: Diagnosis not present

## 2021-10-12 DIAGNOSIS — Z113 Encounter for screening for infections with a predominantly sexual mode of transmission: Secondary | ICD-10-CM

## 2021-10-12 DIAGNOSIS — E785 Hyperlipidemia, unspecified: Secondary | ICD-10-CM | POA: Diagnosis not present

## 2021-10-12 LAB — LIPID PANEL
Cholesterol: 148 mg/dL (ref 0–200)
HDL: 43.8 mg/dL (ref >39.00)
LDL Cholesterol: 82 mg/dL (ref 0–99)
NonHDL: 104.59
Total CHOL/HDL Ratio: 3
Triglycerides: 112 mg/dL (ref 0.0–149.0)
VLDL: 22.4 mg/dL (ref 0.0–40.0)

## 2021-10-12 LAB — CBC WITH DIFFERENTIAL/PLATELET
Basophils Absolute: 0.1 10*3/uL (ref 0.0–0.1)
Basophils Relative: 1 % (ref 0.0–3.0)
Eosinophils Absolute: 0.2 10*3/uL (ref 0.0–0.7)
Eosinophils Relative: 2.8 % (ref 0.0–5.0)
HCT: 46 % (ref 39.0–52.0)
Hemoglobin: 15.3 g/dL (ref 13.0–17.0)
Lymphocytes Relative: 20.7 % (ref 12.0–46.0)
Lymphs Abs: 1.7 10*3/uL (ref 0.7–4.0)
MCHC: 33.3 g/dL (ref 30.0–36.0)
MCV: 89.8 fl (ref 78.0–100.0)
Monocytes Absolute: 0.8 10*3/uL (ref 0.1–1.0)
Monocytes Relative: 10.1 % (ref 3.0–12.0)
Neutro Abs: 5.3 10*3/uL (ref 1.4–7.7)
Neutrophils Relative %: 65.4 % (ref 43.0–77.0)
Platelets: 219 10*3/uL (ref 150.0–400.0)
RBC: 5.12 Mil/uL (ref 4.22–5.81)
RDW: 13.7 % (ref 11.5–15.5)
WBC: 8.1 10*3/uL (ref 4.0–10.5)

## 2021-10-12 LAB — COMPREHENSIVE METABOLIC PANEL
ALT: 43 U/L (ref 0–53)
AST: 33 U/L (ref 0–37)
Albumin: 4.7 g/dL (ref 3.5–5.2)
Alkaline Phosphatase: 45 U/L (ref 39–117)
BUN: 10 mg/dL (ref 6–23)
CO2: 28 mEq/L (ref 19–32)
Calcium: 9.6 mg/dL (ref 8.4–10.5)
Chloride: 105 mEq/L (ref 96–112)
Creatinine, Ser: 1.01 mg/dL (ref 0.40–1.50)
GFR: 92.2 mL/min (ref >60.00)
Glucose, Bld: 93 mg/dL (ref 70–99)
Potassium: 3.9 mEq/L (ref 3.5–5.1)
Sodium: 141 mEq/L (ref 135–145)
Total Bilirubin: 0.4 mg/dL (ref 0.2–1.2)
Total Protein: 7.4 g/dL (ref 6.0–8.3)

## 2021-10-12 MED ORDER — VALACYCLOVIR HCL 1 G PO TABS: 1000.0000 mg | ORAL_TABLET | Freq: Two times a day (BID) | ORAL | 1 refills | Status: AC

## 2021-10-12 NOTE — Progress Notes (Signed)
This visit occurred during the SARS-CoV-2 public health emergency.  Safety protocols were in place, including screening questions prior to the visit, additional usage of staff PPE, and extensive cleaning of exam room while observing appropriate contact time as indicated for disinfecting solutions.  Mood discussed with patient.  On wellbutrin BID and 10mg  buspar BID.  Using klonopin if needed, and that helps with anxiety.  Work situation is some better.  He thought his current meds were helping some.  Less side effects from med, less sweating and erectile function is better.  He is worried about his marriage.  He wants to get into couples counseling but is concerned that his husband is resistant about counseling.  Patient has continued with counseling in the meantime.  We talked about routine PreP with routine cautions, labs, etc. see notes on labs.  We talked about methods to decrease his risk of STD exposure.  We specifically talked about using Truvada after checking his labs.  We talked about having valtrex on hand for cold sores.  Rx printed.   Meds, vitals, and allergies reviewed.   ROS: Per HPI unless specifically indicated in ROS section   GEN: nad, alert and oriented, tearful but regains composure. HEENT:ncat NECK: supple w/o LA CV: rrr.  no murmur PULM: ctab, no inc wob ABD: soft, +bs EXT: no edema SKIN: Well-perfused.  32 minutes were devoted to patient care in this encounter (this includes time spent reviewing the patient's file/history, interviewing and examining the patient, counseling/reviewing plan with patient).

## 2021-10-12 NOTE — Patient Instructions (Signed)
Don't change your meds for now.  Keep working with counseling.  Let me see about options after I see your labs.  Take care.  Glad to see you. Go to the lab on the way out.   If you have mychart we'll likely use that to update you.

## 2021-10-13 LAB — HEPATITIS B CORE AB W/REFLEX: Hep B Core Total Ab: NEGATIVE

## 2021-10-14 ENCOUNTER — Other Ambulatory Visit: Payer: Self-pay | Admitting: Family Medicine

## 2021-10-14 DIAGNOSIS — Z7189 Other specified counseling: Secondary | ICD-10-CM | POA: Insufficient documentation

## 2021-10-14 LAB — C. TRACHOMATIS/N. GONORRHOEAE RNA
C. trachomatis RNA, TMA: NOT DETECTED
N. gonorrhoeae RNA, TMA: NOT DETECTED

## 2021-10-14 LAB — HIV ANTIBODY (ROUTINE TESTING W REFLEX): HIV 1&2 Ab, 4th Generation: NONREACTIVE

## 2021-10-14 LAB — RPR: RPR Ser Ql: NONREACTIVE

## 2021-10-14 MED ORDER — EMTRICITABINE-TENOFOVIR DF 200-300 MG PO TABS: 1.0000 | ORAL_TABLET | Freq: Every day | ORAL | 3 refills | Status: DC

## 2021-10-14 NOTE — Assessment & Plan Note (Signed)
His mood is improved with Wellbutrin and BuSpar.  He is using Klonopin if needed as that helps with anxiety.  Fortunately his work situation has improved.  That is a good change.  He is concerned about his marriage, see above.  He is going to continue with counseling.  See conversation regarding Prep.

## 2021-10-14 NOTE — Assessment & Plan Note (Signed)
We talked about routine PreP with routine cautions, labs, etc. see notes on labs.  We talked about methods to decrease his risk of STD exposure.  We specifically talked about using Truvada after checking his labs.

## 2021-10-16 ENCOUNTER — Ambulatory Visit (INDEPENDENT_AMBULATORY_CARE_PROVIDER_SITE_OTHER): Payer: BC Managed Care – PPO | Admitting: Psychology

## 2021-10-16 DIAGNOSIS — F331 Major depressive disorder, recurrent, moderate: Secondary | ICD-10-CM | POA: Diagnosis not present

## 2021-10-16 DIAGNOSIS — F411 Generalized anxiety disorder: Secondary | ICD-10-CM | POA: Insufficient documentation

## 2021-10-16 NOTE — Progress Notes (Signed)
[facility] Counselor/Therapist Progress Note  Patient ID: Brandon Jackson, MRN: [id number]    Date: 10/16/21  Time Spent: 4:01  pm - 5:03 pm : 62 Minutes  Treatment Type: Individual Therapy.  Reported Symptoms: Sadness,   Mental Status Exam: Appearance:  Neat and Well Groomed     Behavior: Appropriate  Motor: Normal  Speech/Language:  Clear and Coherent and Normal Rate  Affect: Depressed and Tearful  Mood: depressed  Thought process: normal  Thought content:   WNL  Sensory/Perceptual disturbances:   WNL  Orientation: oriented to person, place, time/date, and situation  Attention: Good  Concentration: Good  Memory: WNL  Fund of knowledge:  Good  Insight:   Good  Judgment:  Good  Impulse Control: Good   Risk Assessment: Danger to Self:  No Self-injurious Behavior: No Danger to Others: No Duty to Warn:no Physical Aggression / Violence:No  Access to Firearms a concern: No  Gang Involvement:No   Subjective:   Brandon Jackson participated from office, via video, and consented to treatment. Therapist participated from home office. We met online due to [place] pandemic. Brandon Jackson reviewed the events of the past week.  Brandon Jackson discussed an increase in interpersonal stressors at home with his significant other.  We worked on exploring this during the session and Brandon Jackson is interested in couples counseling.  We delineated his concerns during the session and worked on identifying feelings.  We discussed his current coping with the stressors and worked on reviewing coping skills.  We explored his self talk regarding these issues during session.  Therapist validated Brandon Jackson's experience and feelings and encouraged Brandon Jackson to identify his wants and needs in the relationship, to create a list of needs/wants, to be processed during our follow-up visit.  We reviewed positive and assertive communication, which therapist modeled during the session. We reviewed additional  coping skills and worked on developing boundaries regarding rumination, which therapist modeled during the session, engaging in mindfulness, setting time limits regarding rumination, challenging negative thoughts the evidence in positive self talk, and general relaxation and helpful distraction.  Brandon Jackson did note his interest in couple's counseling and noted scheduled initial appointment for himself to begin the process.  Therapist provided validation and normalized Brandon Jackson's feelings.  Therapist provided supportive therapy. We reviewed safety due to a past history of suicidal ideation.  Per denied any current safety concerns and verbally agreed to adhering to the previously created safety plan that was previously created which included the following:   In case of a mental health emergency:  45 - confidential suicide hotline. [facility]):        [place], [place] [address]       [phone] 3.   911  4.   Visiting Nearest ED.    Interventions: Cognitive Behavioral Therapy and Assertiveness/Communication  Diagnosis:  Generalized anxiety disorder  Major depressive disorder, recurrent episode, moderate (HCC)   Plan: Patient is to use CBT, BA, ACT, mindfulness, and coping skills to help manage decrease symptoms associated with their diagnosis.  Related Problem: Develop healthy thinking patterns and beliefs about self, others, and the world that lead to the alleviation and help prevent the relapse of depression. Description: Verbalize insight into how past relationships may be influencing current experiences with depression. Target Date: 2022-07-06 Progress: 30%  Related Problem: Develop healthy thinking patterns and beliefs about self, others, and the world that lead to the alleviation and help prevent the relapse of depression. Description:  Cooperate with a medication evaluation by a physician. Target Date: 2022-07-06 Progress:  100%  Related Problem: Develop healthy thinking patterns and beliefs about self, others, and the world that lead to the alleviation and help prevent the relapse of depression. Description: Verbalize an understanding and resolution of current interpersonal problems. Target Date: 2022-07-06 Progress: 40%  Related Problem: Develop healthy thinking patterns and beliefs about self, others, and the world that lead to the alleviation and help prevent the relapse of depression. Description: Learn and implement behavioral strategies to overcome depression. Target Date: 2022-07-06 Progress: 40%  Related Problem: Develop healthy thinking patterns and beliefs about self, others, and the world that lead to the alleviation and help prevent the relapse of depression. Description: Identify and replace thoughts and beliefs that support depression. Target Date: 2022-07-06 Progress: 40%  Related Problem: Develop healthy thinking patterns and beliefs about self, others, and the world that lead to the alleviation and help prevent the relapse of depression. Description: Learn and implement conflict resolution skills to resolve interpersonal problems. Target Date: 2022-07-06 Progress: 40%  Related Problem: Reach a balance between work/competitive and social/noncompetitive time in daily life. Description: Increase daily time involved in relaxing activities. Target Date: 2022-07-06 Progress: 20%  Related Problem: Reach a balance between work/competitive and social/noncompetitive time in daily life. Description: Decrease the number of hours worked daily and the frequency of taking work home. Target Date: 2022-07-06 Progress: 50%  Related Problem: Reduce overall frequency, intensity, and duration of the anxiety so that daily functioning is not impaired. Description: Identify, challenge, and replace biased, fearful self-talk with positive, realistic, and empowering self-talk. Target Date: 2022-07-06 Progress:  30%  Related Problem: Reduce overall frequency, intensity, and duration of the anxiety so that daily functioning is not impaired. Description: Describe situations, thoughts, feelings, and actions associated with anxieties and worries, their impact on functioning, and attempts to resolve them. Target Date: 2022-07-06 Progress: 40%  Related Problem: Reduce overall frequency, intensity, and duration of the anxiety so that daily functioning is not impaired. Description: Learn and implement problem-solving strategies for realistically addressing worries. Target Date: 2022-07-06 Progress: 40%  Related Problem: Reduce overall frequency, intensity, and duration of the anxiety so that daily functioning is not impaired. Description: Learn and implement personal and interpersonal skills to reduce anxiety and improve interpersonal relationships. Target Date: 2022-07-06 Progress: 30%   Related Problem: Reduce overall frequency, intensity, and duration of the anxiety so that daily functioning is not impaired. Description: Learn and implement relapse prevention strategies for managing possible future anxiety symptoms. Target Date: 2022-07-06 Progress: 50%  Related Problem: Reduce overall frequency, intensity, and duration of the anxiety so that daily functioning is not impaired. Description: Identify and engage in pleasant activities on a daily basis. Target Date: 2022-07-06 Progress: 50%  [name], LCSW

## 2021-11-16 ENCOUNTER — Ambulatory Visit (INDEPENDENT_AMBULATORY_CARE_PROVIDER_SITE_OTHER): Payer: BC Managed Care – PPO | Admitting: Psychology

## 2021-11-16 DIAGNOSIS — F411 Generalized anxiety disorder: Secondary | ICD-10-CM | POA: Diagnosis not present

## 2021-11-16 DIAGNOSIS — F331 Major depressive disorder, recurrent, moderate: Secondary | ICD-10-CM | POA: Diagnosis not present

## 2021-11-16 NOTE — Progress Notes (Signed)
[place] Counselor/Therapist Progress Note  Patient ID: Brandon Jackson, MRN: [id number]    Date: 11/16/21  Time Spent: 4:01  pm - 4:59 pm : 58 Minutes  Treatment Type: Individual Therapy.  Reported Symptoms: Sadness.   Mental Status Exam: Appearance:  Neat and Well Groomed     Behavior: Appropriate  Motor: Normal  Speech/Language:  Clear and Coherent and Normal Rate  Affect: Depressed  Mood: depressed  Thought process: normal  Thought content:   WNL  Sensory/Perceptual disturbances:   WNL  Orientation: oriented to person, place, time/date, and situation  Attention: Good  Concentration: Good  Memory: WNL  Fund of knowledge:  Good  Insight:   Good  Judgment:  Good  Impulse Control: Good   Risk Assessment: Danger to Self:  No Self-injurious Behavior: No Danger to Others: No Duty to Warn:no Physical Aggression / Violence:No  Access to Firearms a concern: No  Gang Involvement:No   Subjective:   Brandon Jackson participated from office, via video, and consented to treatment. Therapist participated from home office. We met online due to [name]. Brandon Jackson reviewed the events of the past week. Brandon Jackson noted a recent diagnosis of COVID. Brandon Jackson noted difficulty getting restful sleep during that time and noted being tearful due to the state of his marriage. He noted feeling complicated feelings regarding his relationship with his husband. We explored this during the session and the effect on his overall mood and functioning including his lack of sleep due to rumination. We worked on identifying way to address sleep including the use of relaxation, boundary setting regarding rumination, and general sleep hygiene. Therapist encouraged Brandon Jackson to continue engaging in self-care and to receive support from others. We reviewed coping skills during the session. Brandon Jackson denied any safety concerns during the session and a safety plan has been in-place. Therapist  validated and normalized Brandon Jackson's experience and feelings, praised Brandon Jackson for discussing concerns with his husband, and encouraged self-care on a consistent basis. Brandon Jackson was engaged and motivated during the session and expressed commitment towards our goals. Therapist provided supportive therapy.   Interventions: Interpersonal and relaxation and sleep hygiene.  Diagnosis:  Generalized anxiety disorder  Major depressive disorder, recurrent episode, moderate (HCC)   Plan: Patient is to use CBT, BA, ACT, mindfulness, and coping skills to help manage decrease symptoms associated with their diagnosis.  Related Problem: Develop healthy thinking patterns and beliefs about self, others, and the world that lead to the alleviation and help prevent the relapse of depression. Description: Verbalize insight into how past relationships may be influencing current experiences with depression. Target Date: 2022-07-06 Progress: 30%  Related Problem: Develop healthy thinking patterns and beliefs about self, others, and the world that lead to the alleviation and help prevent the relapse of depression. Description: Cooperate with a medication evaluation by a physician. Target Date: 2022-07-06 Progress: 100%  Related Problem: Develop healthy thinking patterns and beliefs about self, others, and the world that lead to the alleviation and help prevent the relapse of depression. Description: Verbalize an understanding and resolution of current interpersonal problems. Target Date: 2022-07-06 Progress: 40%  Related Problem: Develop healthy thinking patterns and beliefs about self, others, and the world that lead to the alleviation and help prevent the relapse of depression. Description: Learn and implement behavioral strategies to overcome depression. Target Date: 2022-07-06 Progress: 40%  Related Problem: Develop healthy thinking patterns and beliefs about self, others, and the world that lead to the  alleviation and help prevent the relapse  of depression. Description: Identify and replace thoughts and beliefs that support depression. Target Date: 2022-07-06 Progress: 40%  Related Problem: Develop healthy thinking patterns and beliefs about self, others, and the world that lead to the alleviation and help prevent the relapse of depression. Description: Learn and implement conflict resolution skills to resolve interpersonal problems. Target Date: 2022-07-06 Progress: 40%  Related Problem: Reach a balance between work/competitive and social/noncompetitive time in daily life. Description: Increase daily time involved in relaxing activities. Target Date: 2022-07-06 Progress: 20%  Related Problem: Reach a balance between work/competitive and social/noncompetitive time in daily life. Description: Decrease the number of hours worked daily and the frequency of taking work home. Target Date: 2022-07-06 Progress: 50%  Related Problem: Reduce overall frequency, intensity, and duration of the anxiety so that daily functioning is not impaired. Description: Identify, challenge, and replace biased, fearful self-talk with positive, realistic, and empowering self-talk. Target Date: 2022-07-06 Progress: 30%  Related Problem: Reduce overall frequency, intensity, and duration of the anxiety so that daily functioning is not impaired. Description: Describe situations, thoughts, feelings, and actions associated with anxieties and worries, their impact on functioning, and attempts to resolve them. Target Date: 2022-07-06 Progress: 40%  Related Problem: Reduce overall frequency, intensity, and duration of the anxiety so that daily functioning is not impaired. Description: Learn and implement problem-solving strategies for realistically addressing worries. Target Date: 2022-07-06 Progress: 40%  Related Problem: Reduce overall frequency, intensity, and duration of the anxiety so that daily functioning is  not impaired. Description: Learn and implement personal and interpersonal skills to reduce anxiety and improve interpersonal relationships. Target Date: 2022-07-06 Progress: 30%   Related Problem: Reduce overall frequency, intensity, and duration of the anxiety so that daily functioning is not impaired. Description: Learn and implement relapse prevention strategies for managing possible future anxiety symptoms. Target Date: 2022-07-06 Progress: 50%  Related Problem: Reduce overall frequency, intensity, and duration of the anxiety so that daily functioning is not impaired. Description: Identify and engage in pleasant activities on a daily basis. Target Date: 2022-07-06 Progress: 50%  [name], LCSW

## 2021-11-17 ENCOUNTER — Other Ambulatory Visit: Payer: Self-pay | Admitting: Family Medicine

## 2021-11-19 ENCOUNTER — Other Ambulatory Visit: Payer: Self-pay | Admitting: Podiatry

## 2021-11-20 DIAGNOSIS — N451 Epididymitis: Secondary | ICD-10-CM | POA: Diagnosis not present

## 2021-11-25 ENCOUNTER — Ambulatory Visit: Payer: BC Managed Care – PPO | Admitting: Nurse Practitioner

## 2021-11-25 ENCOUNTER — Other Ambulatory Visit: Payer: Self-pay

## 2021-11-25 ENCOUNTER — Encounter: Payer: Self-pay | Admitting: Nurse Practitioner

## 2021-11-25 VITALS — BP 130/100 | HR 101 | Temp 95.8°F | Resp 18

## 2021-11-25 DIAGNOSIS — N4889 Other specified disorders of penis: Secondary | ICD-10-CM

## 2021-11-25 DIAGNOSIS — L089 Local infection of the skin and subcutaneous tissue, unspecified: Secondary | ICD-10-CM

## 2021-11-25 MED ORDER — SULFAMETHOXAZOLE-TRIMETHOPRIM 800-160 MG PO TABS: 1.0000 | ORAL_TABLET | Freq: Two times a day (BID) | ORAL | 0 refills | Status: AC

## 2021-11-25 NOTE — Progress Notes (Signed)
° °  Subjective:    Patient ID: Brandon Jackson, male    DOB: August 08, 1981, 41 y.o.   MRN: [id number]  HPI  41 year old male presenting to [facility] with complaints of cyst on right side of penis. This has been present for the past week and patient attributes it to an ingrown hair.   He has had an ingrown hair surgically removed once in the past from his left neck.   Denies a history of MRSA  He is married and monogamous and denies risk for STI  Has been attempting warm compress to area without relief.     Review of Systems  Constitutional: Negative.   HENT: Negative.    Respiratory: Negative.    Cardiovascular: Negative.   Gastrointestinal: Negative.   Genitourinary: Negative.   Skin:  Positive for wound.  Neurological: Negative.       Objective:   Physical Exam HENT:     Head: Normocephalic.  Pulmonary:     Effort: Pulmonary effort is normal.  Genitourinary:    Penis: Lesions present.     Musculoskeletal:     Cervical back: Normal range of motion.  Skin:    General: Skin is warm.     Findings: Erythema and lesion present.     Comments: Pea sized cyst to right shaft erythematous, surrounding skin WNL no active drainage   Neurological:     General: No focal deficit present.     Mental Status: He is alert.          Assessment & Plan:  1. Epidermoid cyst of skin of penis   - Ambulatory referral to General Surgery  2. Skin infection Warm Epson Salt soaks, start abx and ibuprofen as needed for pain and inflammation   Meds ordered this encounter  Medications   sulfamethoxazole-trimethoprim (BACTRIM DS) 800-160 MG tablet    Sig: Take 1 tablet by mouth 2 (two) times daily for 10 days.    Dispense:  20 tablet    Refill:  0

## 2021-11-27 ENCOUNTER — Telehealth: Payer: Self-pay | Admitting: Nurse Practitioner

## 2021-11-27 ENCOUNTER — Encounter: Payer: Self-pay | Admitting: Nurse Practitioner

## 2021-11-27 ENCOUNTER — Ambulatory Visit (INDEPENDENT_AMBULATORY_CARE_PROVIDER_SITE_OTHER): Payer: BC Managed Care – PPO | Admitting: Psychology

## 2021-11-27 DIAGNOSIS — F411 Generalized anxiety disorder: Secondary | ICD-10-CM

## 2021-11-27 DIAGNOSIS — F331 Major depressive disorder, recurrent, moderate: Secondary | ICD-10-CM

## 2021-11-27 NOTE — Addendum Note (Signed)
Addended by: Madilyn Hook on: 11/27/2021 03:22 PM   Modules accepted: Orders

## 2021-11-27 NOTE — Progress Notes (Signed)
[facility] Counselor/Therapist Progress Note ° °Patient ID: Brandon Jackson, MRN: [id number]   ° °Date: 11/27/21 ° °Time Spent: 4:02  pm - 4:55 pm : 53 Minutes ° °Treatment Type: Individual Therapy. ° °Reported Symptoms: Sadness.  ° °Mental Status Exam: °Appearance:  Neat and Well Groomed     °Behavior: Appropriate  °Motor: Normal  °Speech/Language:  Clear and Coherent and Normal Rate  °Affect: Depressed  °Mood: depressed  °Thought process: normal  °Thought content:   WNL  °Sensory/Perceptual disturbances:   WNL  °Orientation: oriented to person, place, time/date, and situation  °Attention: Good  °Concentration: Good  °Memory: WNL  °Fund of knowledge:  Good  °Insight:   Good  °Judgment:  Good  °Impulse Control: Good  ° °Risk Assessment: °Danger to Self:  No °Self-injurious Behavior: No °Danger to Others: No °Duty to Warn:no °Physical Aggression / Violence:No  °Access to Firearms a concern: No  °Gang Involvement:No  ° °Subjective:  ° °Valery D Geremia participated from office, via video, and consented to treatment. Therapist participated from home office. We met online due to COVID pandemic. Damarius reviewed the events of the past week. Bridger noted the update regarding his marriage and the effect on his mood.  Wilborn discussed having complicated feelings but we worked on feeling identification during the session.  Kobyn discussed his worry regarding his marriage and possible transitions going forward which we delineated in depth during session.  We worked on identifying previous and future coping mechanisms to manage the stress in a positive and hopeful manner.  We discussed ways to set boundaries regarding worry.  Therapist encouraged Johntavius to identify relationships with family and friends that to provide him support during this time.  Amareon was engaged and motivated during the session and expressed commitment towards her goals.  Therapist normalized Dia's feelings and experience.   Therapist provided supportive therapy.  A follow-up was scheduled for additional treatment.  ° °Interventions: Interpersonal ° °Diagnosis: ° °Generalized anxiety disorder ° °Major depressive disorder, recurrent episode, moderate (HCC) ° ° °Plan: Patient is to use CBT, BA, ACT, mindfulness, and coping skills to help manage decrease symptoms associated with their diagnosis. ° °Related Problem: Develop healthy thinking patterns and beliefs about self, others, and the world that lead to the alleviation and help prevent the relapse of depression. °Description: Verbalize insight into how past relationships may be influencing current experiences with depression. °Target Date: 2022-07-06 °Progress: 30% ° °Related Problem: Develop healthy thinking patterns and beliefs about self, others, and the world that lead to the alleviation and help prevent the relapse of depression. °Description: Cooperate with a medication evaluation by a physician. °Target Date: 2022-07-06 °Progress: 100% ° °Related Problem: Develop healthy thinking patterns and beliefs about self, others, and the world that lead to the alleviation and help prevent the relapse of depression. °Description: Verbalize an understanding and resolution of current interpersonal problems. °Target Date: 2022-07-06 °Progress: 40% ° °Related Problem: Develop healthy thinking patterns and beliefs about self, others, and the world that lead to the alleviation and help prevent the relapse of depression. °Description: Learn and implement behavioral strategies to overcome depression. °Target Date: 2022-07-06 °Progress: 40% ° °Related Problem: Develop healthy thinking patterns and beliefs about self, others, and the world that lead to the alleviation and help prevent the relapse of depression. °Description: Identify and replace thoughts and beliefs that support depression. °Target Date: 2022-07-06 °Progress: 40% ° °Related Problem: Develop healthy thinking patterns and beliefs about  self, others, and the world that   lead to the alleviation and help prevent the relapse of depression. Description: Learn and implement conflict resolution skills to resolve interpersonal problems. Target Date: 2022-07-06 Progress: 40%  Related Problem: Reach a balance between work/competitive and social/noncompetitive time in daily life. Description: Increase daily time involved in relaxing activities. Target Date: 2022-07-06 Progress: 20%  Related Problem: Reach a balance between work/competitive and social/noncompetitive time in daily life. Description: Decrease the number of hours worked daily and the frequency of taking work home. Target Date: 2022-07-06 Progress: 50%  Related Problem: Reduce overall frequency, intensity, and duration of the anxiety so that daily functioning is not impaired. Description: Identify, challenge, and replace biased, fearful self-talk with positive, realistic, and empowering self-talk. Target Date: 2022-07-06 Progress: 30%  Related Problem: Reduce overall frequency, intensity, and duration of the anxiety so that daily functioning is not impaired. Description: Describe situations, thoughts, feelings, and actions associated with anxieties and worries, their impact on functioning, and attempts to resolve them. Target Date: 2022-07-06 Progress: 40%  Related Problem: Reduce overall frequency, intensity, and duration of the anxiety so that daily functioning is not impaired. Description: Learn and implement problem-solving strategies for realistically addressing worries. Target Date: 2022-07-06 Progress: 40%  Related Problem: Reduce overall frequency, intensity, and duration of the anxiety so that daily functioning is not impaired. Description: Learn and implement personal and interpersonal skills to reduce anxiety and improve interpersonal relationships. Target Date: 2022-07-06 Progress: 30%   Related Problem: Reduce overall frequency, intensity, and  duration of the anxiety so that daily functioning is not impaired. Description: Learn and implement relapse prevention strategies for managing possible future anxiety symptoms. Target Date: 2022-07-06 Progress: 50%  Related Problem: Reduce overall frequency, intensity, and duration of the anxiety so that daily functioning is not impaired. Description: Identify and engage in pleasant activities on a daily basis. Target Date: 2022-07-06 Progress: 50%    [name], LCSW

## 2021-11-27 NOTE — Telephone Encounter (Signed)
Left message

## 2021-12-03 ENCOUNTER — Other Ambulatory Visit: Payer: Self-pay | Admitting: Family Medicine

## 2021-12-03 NOTE — Telephone Encounter (Signed)
Tried to call patient but phone rang and rang then hung up

## 2021-12-03 NOTE — Telephone Encounter (Signed)
Sent.  Please get update on patient and let me know about his condition/mood.  Thanks.

## 2021-12-03 NOTE — Telephone Encounter (Signed)
Called cell number and LMTCB

## 2021-12-03 NOTE — Telephone Encounter (Signed)
Patient states he has been doing well and uses rx as needed when he needs it.

## 2021-12-03 NOTE — Telephone Encounter (Signed)
Last filled on 09/01/21 #20 with 1 refill LOV 10/12/21 for acute issues  No future appointments

## 2021-12-11 ENCOUNTER — Ambulatory Visit (INDEPENDENT_AMBULATORY_CARE_PROVIDER_SITE_OTHER): Payer: BC Managed Care – PPO | Admitting: Psychology

## 2021-12-11 DIAGNOSIS — F411 Generalized anxiety disorder: Secondary | ICD-10-CM | POA: Diagnosis not present

## 2021-12-11 DIAGNOSIS — F331 Major depressive disorder, recurrent, moderate: Secondary | ICD-10-CM

## 2021-12-11 NOTE — Progress Notes (Signed)
[place] Counselor/Therapist Progress Note  Patient ID: Brandon Jackson, MRN: [id number]    Date: 12/11/21  Time Spent: 4:03  pm - 4:58 pm : 55 Minutes  Treatment Type: Individual Therapy.  Reported Symptoms: Sadness.   Mental Status Exam: Appearance:  Neat and Well Groomed     Behavior: Appropriate  Motor: Normal  Speech/Language:  Clear and Coherent and Normal Rate  Affect: Congruent and Depressed  Mood: anxious and dysthymic  Thought process: normal  Thought content:   WNL  Sensory/Perceptual disturbances:   WNL  Orientation: oriented to person, place, time/date, and situation  Attention: Good  Concentration: Good  Memory: WNL  Fund of knowledge:  Good  Insight:   Good  Judgment:  Good  Impulse Control: Good   Risk Assessment: Danger to Self:  No Self-injurious Behavior: No Danger to Others: No Duty to Warn:no Physical Aggression / Violence:No  Access to Firearms a concern: No  Gang Involvement:No   Subjective:   Brandon Jackson participated from office, via video, and consented to treatment. Therapist participated from home office. We met online due to [place] pandemic. Brandon Jackson reviewed the events of the past week. Brandon Jackson discussed an increase in his overall stressors due to his separation from husband and noted uncertainty over future plans. He noted a need to discuss boundaries going forward. We explored this during the session and worked on identifying additional boundaries going forward. Brandon Jackson noted a need to disclose the separation, to his friends, but noted difficulty in identifying a method of communicating this. Therapist normalized and validated  Brandon Jackson's feelings and normalized them during the session. Brandon Jackson noted an increase in professional stressors due to feelings of being micro managed by his Contractor. We explored this during the session and reviewed assertive communication. We worked on identifying ways to manage these  stressors going forward. We reviewed coping, self-care, and mindfulness. Brandon Jackson noted interest in working on bolstering his ability to say "no" personally and professionally. He noted feelings of guilt and discomfort related to saying "no". We will explore this during follow-up appointments. Therapist provided supportive therapy.   Interventions: Interpersonal  Diagnosis:  Generalized anxiety disorder  Major depressive disorder, recurrent episode, moderate (HCC)   Plan: Patient is to use CBT, BA, ACT, mindfulness, and coping skills to help manage decrease symptoms associated with their diagnosis.  Related Problem: Develop healthy thinking patterns and beliefs about self, others, and the world that lead to the alleviation and help prevent the relapse of depression. Description: Verbalize insight into how past relationships may be influencing current experiences with depression. Target Date: 2022-07-06 Progress: 30%  Related Problem: Develop healthy thinking patterns and beliefs about self, others, and the world that lead to the alleviation and help prevent the relapse of depression. Description: Cooperate with a medication evaluation by a physician. Target Date: 2022-07-06 Progress: 100%  Related Problem: Develop healthy thinking patterns and beliefs about self, others, and the world that lead to the alleviation and help prevent the relapse of depression. Description: Verbalize an understanding and resolution of current interpersonal problems. Target Date: 2022-07-06 Progress: 40%  Related Problem: Develop healthy thinking patterns and beliefs about self, others, and the world that lead to the alleviation and help prevent the relapse of depression. Description: Learn and implement behavioral strategies to overcome depression. Target Date: 2022-07-06 Progress: 40%  Related Problem: Develop healthy thinking patterns and beliefs about self, others, and the world that lead to the  alleviation and help prevent the relapse of  depression. Description: Identify and replace thoughts and beliefs that support depression. Target Date: 2022-07-06 Progress: 40%  Related Problem: Develop healthy thinking patterns and beliefs about self, others, and the world that lead to the alleviation and help prevent the relapse of depression. Description: Learn and implement conflict resolution skills to resolve interpersonal problems. Target Date: 2022-07-06 Progress: 40%  Related Problem: Reach a balance between work/competitive and social/noncompetitive time in daily life. Description: Increase daily time involved in relaxing activities. Target Date: 2022-07-06 Progress: 20%  Related Problem: Reach a balance between work/competitive and social/noncompetitive time in daily life. Description: Decrease the number of hours worked daily and the frequency of taking work home. Target Date: 2022-07-06 Progress: 50%  Related Problem: Reduce overall frequency, intensity, and duration of the anxiety so that daily functioning is not impaired. Description: Identify, challenge, and replace biased, fearful self-talk with positive, realistic, and empowering self-talk. Target Date: 2022-07-06 Progress: 30%  Related Problem: Reduce overall frequency, intensity, and duration of the anxiety so that daily functioning is not impaired. Description: Describe situations, thoughts, feelings, and actions associated with anxieties and worries, their impact on functioning, and attempts to resolve them. Target Date: 2022-07-06 Progress: 40%  Related Problem: Reduce overall frequency, intensity, and duration of the anxiety so that daily functioning is not impaired. Description: Learn and implement problem-solving strategies for realistically addressing worries. Target Date: 2022-07-06 Progress: 40%  Related Problem: Reduce overall frequency, intensity, and duration of the anxiety so that daily functioning is  not impaired. Description: Learn and implement personal and interpersonal skills to reduce anxiety and improve interpersonal relationships. Target Date: 2022-07-06 Progress: 30%   Related Problem: Reduce overall frequency, intensity, and duration of the anxiety so that daily functioning is not impaired. Description: Learn and implement relapse prevention strategies for managing possible future anxiety symptoms. Target Date: 2022-07-06 Progress: 50%  Related Problem: Reduce overall frequency, intensity, and duration of the anxiety so that daily functioning is not impaired. Description: Identify and engage in pleasant activities on a daily basis. Target Date: 2022-07-06 Progress: 50%  [name], LCSW

## 2021-12-16 ENCOUNTER — Ambulatory Visit: Payer: Self-pay | Admitting: Urology

## 2021-12-16 ENCOUNTER — Other Ambulatory Visit: Payer: Self-pay | Admitting: Family Medicine

## 2021-12-23 ENCOUNTER — Other Ambulatory Visit: Payer: Self-pay

## 2021-12-23 ENCOUNTER — Encounter: Payer: Self-pay | Admitting: Psychiatry

## 2021-12-23 ENCOUNTER — Ambulatory Visit (INDEPENDENT_AMBULATORY_CARE_PROVIDER_SITE_OTHER): Payer: BC Managed Care – PPO | Admitting: Psychiatry

## 2021-12-23 VITALS — BP 133/91 | HR 78 | Temp 98.3°F | Wt 213.8 lb

## 2021-12-23 DIAGNOSIS — F411 Generalized anxiety disorder: Secondary | ICD-10-CM

## 2021-12-23 DIAGNOSIS — G4700 Insomnia, unspecified: Secondary | ICD-10-CM

## 2021-12-23 DIAGNOSIS — F330 Major depressive disorder, recurrent, mild: Secondary | ICD-10-CM | POA: Diagnosis not present

## 2021-12-23 DIAGNOSIS — G4701 Insomnia due to medical condition: Secondary | ICD-10-CM | POA: Insufficient documentation

## 2021-12-23 MED ORDER — ESZOPICLONE 1 MG PO TABS: 1.0000 mg | ORAL_TABLET | Freq: Every evening | ORAL | 0 refills | Status: DC | PRN

## 2021-12-23 NOTE — Progress Notes (Signed)
Psychiatric Initial Adult Assessment   Patient Identification: Brandon Jackson MRN:  [id number] Date of Evaluation:  12/23/2021 Referral Source: Dr.Graham Para March Chief Complaint:   Chief Complaint  Patient presents with   Establish Care 41 year old Caucasian male with history of depression, anxiety, presented to establish care for medication management.   Visit Diagnosis:    ICD-10-CM   1. Mild episode of recurrent major depressive disorder (HCC)  F33.0 TSH    2. GAD (generalized anxiety disorder)  F41.1 TSH    3. Insomnia, unspecified type  G47.00 eszopiclone (LUNESTA) 1 MG TABS tablet    TSH      History of Present Illness:  Brandon Jackson is a 41 year old Caucasian male, currently lives with his partner, employed with [employer], lives in [place], has a history of MDD, GAD, insomnia, hyperlipidemia, epidermoid cyst of skin of penis, was evaluated in office today, presented to establish care.  Patient was under the care of Dr. Maryruth Bun previously.  Patient reports most recently medications were managed by Dr. Nelda Marseille care provider.  Patient reports over the summer he decided to get off of his medications since he was feeling better.  However by September he realized that he was going through another 'breakdown'.  Patient reports he struggled with sadness, anhedonia, low motivation, suicidality at that time.  His primary care provider hence restarted his medications.  He is currently taking Wellbutrin as well as BuSpar.  Patient reports overall mood symptoms are currently improving.  He however is currently going through multiple psychosocial stressors which does have an impact on his mood.  Reports he is currently going through separation from his partner.  They continue to live together however is planning to make some decisions regarding this.  Patient reports that is stressful and hard.  He is currently working with his therapist, Mr.Mustafa every 2 weeks or so.   Therapy has been helpful.  Patient calls himself a Product/process development scientist, worries about everything to the extreme all the time.  Patient reports he often feels overwhelmed, on edge.  He reports the therapy sessions are beneficial for his anxiety and BuSpar has made a difference as well.  He wants to stay on this medication for now.  Patient does struggle with sleep.  Lot of factors contributing to sleep problems.  Currently sleeps on an air mattress which is affecting his sleep.  Patient also reports he lacks sleep hygiene.  He is currently trying doxylamine over-the-counter however that is not helping.  He used to be on Lunesta however was taken off of it when the Klonopin was added.  He does not take the Klonopin daily and uses it as needed.  Agreeable to restarting Lunesta.  Denies any history of trauma.  Denies any current suicidality, homicidality or perceptual disturbances.  Denies any substance abuse problems.  Denies any current side effects to medications.  Recently had epidermoid cyst of skin of penis and is currently under the care of his primary care provider.     Associated Signs/Symptoms: Depression Symptoms:  insomnia, anxiety, (Hypo) Manic Symptoms:   Denies Anxiety Symptoms:  Excessive Worry, Psychotic Symptoms:   Denies PTSD Symptoms: Negative  Past Psychiatric History: Previously was under the care of Dr. Caryn Section, in [place].  Most recently medications were prescribed by primary care provider-Dr. Para March.  Past trials of medications like Effexor, Lexapro, Wellbutrin, Lunesta, trazodone.  Denies past inpatient mental health admissions.  Denies suicide attempts.  Previous Psychotropic Medications: Yes as noted above.  Substance Abuse History  in the last 12 months:  No.  Consequences of Substance Abuse: Negative  Past Medical History:  Past Medical History:  Diagnosis Date   Anxiety    Depression    Frequent headaches    History of chicken pox    Migraines     Ulcer     Past Surgical History:  Procedure Laterality Date   CYSTECTOMY  2003   TONSILLECTOMY AND ADENOIDECTOMY  2007   Wisdom Teeth Removal  1998    Family Psychiatric History: As noted below.  Family History:  Family History  Problem Relation Age of Onset   Depression Mother    Diabetes Father    Hyperlipidemia Father    Hypertension Father    Colon cancer Neg Hx    Prostate cancer Neg Hx     Social History:   Social History   Socioeconomic History   Marital status: Significant Other    Spouse name: Not on file   Number of children: Not on file   Years of education: College   Highest education level: Not on file  Occupational History   Occupation: communications    Employer: [employer]  Tobacco Use   Smoking status: Former    Types: Cigarettes    Quit date: 2013    Years since quitting: 10.1    Passive exposure: Past   Smokeless tobacco: Never  Vaping Use   Vaping Use: Never used  Substance and Sexual Activity   Alcohol use: Yes    Comment: occasional   Drug use: No   Sexual activity: Not on file  Other Topics Concern   Not on file  Social History Narrative   In committed relationship since 2007   [employer] grad '03   Working at [employer] as of 2019, 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     Social Determinants of Health   Financial Resource Strain: Not on file  Food Insecurity: Not on file  Transportation Needs: Not on file  Physical Activity: Not on file  Stress: Not on file  Social Connections: Not on file    Additional Social History: Patient was [place].  He was raised by biological parents.  He has a good relationship with his parents.  Parents are still married and live together.  Patient has 1 brother.  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  Patient is currently going through separation with his male partner of over 16 years.  Although  they continue to live together.  Patient denies any history of sexual or physical trauma.  [place].  Allergies:   Allergies  Allergen Reactions   Wellbutrin [Bupropion] Other (See Comments)    Anxiety increased on med when taken alone.  He can tolerate wellbutrin when taken with lexparo.    Erythromycin Diarrhea and Rash    Metabolic Disorder Labs: No results found for: HGBA1C, MPG No results found for: PROLACTIN Lab Results  Component Value Date   CHOL 148 10/12/2021   TRIG 112.0 10/12/2021   HDL 43.80 10/12/2021   CHOLHDL 3 10/12/2021   VLDL 22.4 10/12/2021   LDLCALC 82 10/12/2021   LDLCALC 107 (H) 01/24/2018   No results found for: TSH  Therapeutic Level Labs: No results found for: LITHIUM No results found for: CBMZ No results found for: VALPROATE  Current Medications: Current Outpatient Medications  Medication Sig Dispense Refill   allopurinol (ZYLOPRIM) 100 MG tablet TAKE  1 TABLET BY MOUTH TWICE A DAY 60 tablet 6   buPROPion (WELLBUTRIN SR) 150 MG 12 hr tablet Take 1 tablet (150 mg total) by mouth 2 (two) times daily.     busPIRone (BUSPAR) 5 MG tablet TAKE 1 TABLET BY MOUTH 2 (TWO) TIMES DAILY. INCREASE TO 2 TABS TWICE A DAY AFTER 7 DAYS IF NEEDED. 360 tablet 2   cetirizine (ZYRTEC) 10 MG tablet Take 1 tablet (10 mg total) by mouth daily. 30 tablet 0   clonazePAM (KLONOPIN) 0.5 MG tablet 0.5-1 TAB TWICE A DAY IF NEEDED FOR ANXIETY CAN TAKE 0.5-1 TAB AT NIGHT FOR INSOMNIA. SEDATION CAUTION 20 tablet 1   colchicine 0.6 MG tablet 2 po at onset of pain, may repeat 1 tab in 1 hour if still hurting- no more then 3 tablets in 24 hours 12 tablet 1   emtricitabine-tenofovir (TRUVADA) 200-300 MG tablet Take 1 tablet by mouth daily. 90 tablet 3   eszopiclone (LUNESTA) 1 MG TABS tablet Take 1 tablet (1 mg total) by mouth at bedtime as needed for sleep. Take immediately before bedtime 30 tablet 0   Multiple Vitamin (MULTIVITAMIN) tablet Take 1 tablet by mouth  daily.     valACYclovir (VALTREX) 1000 MG tablet Take 1,000 mg by mouth 2 (two) times daily.     No current facility-administered medications for this visit.    Musculoskeletal: Strength & Muscle Tone: within normal limits Gait & Station: normal Patient leans: N/A  Psychiatric Specialty Exam: Review of Systems  Musculoskeletal:  Positive for back pain (chronic).  Psychiatric/Behavioral:  Positive for sleep disturbance. The patient is nervous/anxious.   All other systems reviewed and are negative.  Blood pressure (!) 133/91, pulse 78, temperature 98.3 F (36.8 C), temperature source Temporal, weight 213 lb 12.8 oz (97 kg).Body mass index is 33.49 kg/m.  General Appearance: Casual  Eye Contact:  Fair  Speech:  Clear and Coherent  Volume:  Normal  Mood:  Anxious  Affect:  Congruent  Thought Process:  Goal Directed and Descriptions of Associations: Intact  Orientation:  Full (Time, Place, and Person)  Thought Content:  Logical  Suicidal Thoughts:  No  Homicidal Thoughts:  No  Memory:  Immediate;   Fair Recent;   Fair Remote;   Fair  Judgement:  Fair  Insight:  Fair  Psychomotor Activity:  Normal  Concentration:  Concentration: Fair and Attention Span: Fair  Recall:  [employer] of Knowledge:Fair  Language: Fair  Akathisia:  No  Handed:  Right  AIMS (if indicated):  not done  Assets:  Communication Skills Desire for Improvement Financial Resources/Insurance Housing Social Support Talents/Skills Transportation Vocational/Educational  ADL's:  Intact  Cognition: WNL  Sleep:  Poor   Screenings: GAD-7    Flowsheet Row Office Visit from 12/23/2021 in [facility] Office Visit from 09/01/2021 in [facility] Visit from 08/06/2021 in [facility]  Total GAD-7 Score 14 17 19       [employer]    Flowsheet Row Office Visit from 12/23/2021 in [facility] Office Visit from  09/01/2021 in [facility] Visit from 01/26/2018 in [facility] Visit from 01/24/2017 in [facility] Total Score 1 4 0 4  PHQ-9 Total Score 7 19 -- 12      Flowsheet Row Office Visit from 12/23/2021 in [facility] ED from 01/02/2021 in [facility]  at [facility]   C-SSRS RISK CATEGORY No Risk No Risk       Assessment and Plan: ORRIS PERIN is a 41 year old Caucasian male, employed, lives in [place], has a history of depression, anxiety, presented to establish care.  Patient continues to struggle with sleep problems as well as multiple psychosocial stressors, will benefit from the following plan. The patient demonstrates the following risk factors for suicide: Chronic risk factors for suicide include: psychiatric disorder of depression, anxiety . Acute risk factors for suicide include: family or marital conflict. Protective factors for this patient include: positive social support, positive therapeutic relationship, coping skills, and hope for the future. Considering these factors, the overall suicide risk at this point appears to be low. Patient is appropriate for outpatient follow up.  Plan  MDD-improving Continue Wellbutrin SR 150 mg p.o. twice daily BuSpar 10 mg p.o. twice daily  GAD-improving BuSpar 10 mg p.o. twice daily Continue Klonopin 0.5 mg as needed, advised to limit use.  Discussed long-term side effects. Continue CBT with Ms. Mustafa.  Insomnia-unstable Discussed sleep hygiene techniques, discussed to change air mattress, having a set bedtime, wake-up time. Advised to take the Wellbutrin SR 150 mg evening dose earlier since it could also affect sleep. Start Lunesta 1 mg p.o. nightly Reviewed [place] PMP aware.  I have reviewed notes by Dr.Kapur- most recent dated - Nov 28, 2020-as noted above.  Reviewed notes per Dr. Adelina Mings  10/12/2021-was advised to continue the Wellbutrin, BuSpar and Klonopin.  Will order labs-TSH-provided lab slip.    Collaboration of Care: Referral or follow-up with counselor/therapist AEB patient to continue to follow up with [facility]  Patient/Guardian was advised Release of Information must be obtained prior to any record release in order to collaborate their care with an outside provider. Patient/Guardian was advised if they have not already done so to contact the registration department to sign all necessary forms in order for [place] to release information regarding their care.   Consent: Patient/Guardian gives verbal consent for treatment and assignment of benefits for services provided during this visit. Patient/Guardian expressed understanding and agreed to proceed.    Follow-up in clinic in 2 to 3 weeks or sooner in person.  This note was generated in part or whole with voice recognition software. Voice recognition is usually quite accurate but there are transcription errors that can and very often do occur. I apologize for any typographical errors that were not detected and corrected.     Brandon Longs, MD 2/15/20235:24 PM

## 2021-12-23 NOTE — Patient Instructions (Signed)
Eszopiclone Tablets What is this medication? ESZOPICLONE (es ZOE pi clone) treats insomnia. It helps you go to sleep faster and stay asleep through the night. It is often used for a short period of time. This medicine may be used for other purposes; ask your health care provider or pharmacist if you have questions. COMMON BRAND NAME(S): Lunesta What should I tell my care team before I take this medication? They need to know if you have any of these conditions: Depression Liver disease Lung or breathing disease, such as asthma or COPD Substance use disorder Sleep-walking, driving, eating or other activity while not fully awake after taking a sleep medication Suicidal thoughts, plans, or attempt by you or a family member An unusual or allergic reaction to eszopiclone, other medications, foods, dyes, or preservatives Pregnant or trying to get pregnant Breast-feeding How should I use this medication? Take this medication by mouth with a glass of water. Follow the directions on the prescription label. It is better to take this medication on an empty stomach and only when you are ready for bed. Do not take your medication more often than directed. If you have been taking this medication for several weeks and suddenly stop taking it, you may get unpleasant withdrawal symptoms. Your care team may want to gradually reduce the dose. Do not stop taking this medication on your own. Always follow your care team's advice. A special MedGuide will be given to you by the pharmacist with each prescription and refill. Be sure to read this information carefully each time. Talk to your care team about the use of this medication in children. Special care may be needed. Overdosage: If you think you have taken too much of this medicine contact a poison control center or emergency room at once. NOTE: This medicine is only for you. Do not share this medicine with others. What if I miss a dose? This does not apply. This  medication should only be taken immediately before going to sleep. Do not take double or extra doses. What may interact with this medication? Herbal medications like kava kava, melatonin, St. John's Wort and valerian Lorazepam Medications for fungal infections like ketoconazole, fluconazole, or itraconazole Olanzapine This list may not describe all possible interactions. Give your health care provider a list of all the medicines, herbs, non-prescription drugs, or dietary supplements you use. Also tell them if you smoke, drink alcohol, or use illegal drugs. Some items may interact with your medicine. What should I watch for while using this medication? Visit your care team for regular checks on your progress. Keep a regular sleep schedule by going to bed at about the same time nightly. Avoid caffeine-containing drinks in the evening hours, as caffeine can cause trouble with falling asleep. Talk to your care team if you still have trouble sleeping. You may do unusual sleep behaviors or activities you do not remember the day after taking this medication. Activities include driving, making or eating food, talking on the phone, sexual activity, or sleep walking. Stop taking this medication and call your care team right away if you find out you have done activities like this. Plan to go to bed and stay in bed for a full night (7 to 8 hours) after you take this medication. You may still be drowsy the morning after taking this medication. This medication may affect your coordination, reaction time, or judgment. Do not drive or operate machinery until you know how this medication affects you. Sit up or stand slowly to   reduce the risk of dizzy or fainting spells. If you or your family notice any changes in your behavior, such as new or worsening depression, thoughts of harming yourself, anxiety, other unusual or disturbing thoughts, or memory loss, call your care team right away. After you stop taking this  medication, you may have trouble falling asleep. This is called rebound insomnia. This problem usually goes away on its own after 1 or 2 nights. What side effects may I notice from receiving this medication? Side effects that you should report to your care team as soon as possible: Allergic reactions or angioedema--skin rash, itching, hives, swelling of the face, eyes, lips, tongue, arms, or legs, trouble swallowing or breathing CNS depression--slow or shallow breathing, shortness of breath, feeling faint, dizziness, confusion, trouble staying awake Mood and behavior changes--anxiety, nervousness, confusion, hallucinations, irritability, hostility, thoughts of suicide or self-harm, worsening mood, feelings of depression Unusual sleep behaviors or activities you do not remember such as driving, eating, or sexual activity Side effects that usually do not require medical attention (report to your care team if they continue or are bothersome): Dizziness Drowsiness the day after use Dry mouth Headache Metallic taste in mouth This list may not describe all possible side effects. Call your doctor for medical advice about side effects. You may report side effects to FDA at 1-800-FDA-1088. Where should I keep my medication? Keep out of the reach of children. This medication can be abused. Keep your medication in a safe place to protect it from theft. Do not share this medication with anyone. Selling or giving away this medication is dangerous and against the law. This medication may cause accidental overdose and death if taken by other adults, children, or pets. Mix any unused medication with a substance like cat litter or coffee grounds. Then throw the medication away in a sealed container like a sealed bag or a coffee can with a lid. Do not use the medication after the expiration date. Store at room temperature between 15 and 30 degrees C (59 and 86 degrees F). NOTE: This sheet is a summary. It may not  cover all possible information. If you have questions about this medicine, talk to your doctor, pharmacist, or health care provider.  2022 Elsevier/Gold Standard (2021-06-01 00:00:00)  

## 2021-12-26 DIAGNOSIS — N451 Epididymitis: Secondary | ICD-10-CM | POA: Diagnosis not present

## 2022-01-05 ENCOUNTER — Other Ambulatory Visit: Payer: Self-pay

## 2022-01-05 ENCOUNTER — Other Ambulatory Visit: Payer: BC Managed Care – PPO

## 2022-01-05 DIAGNOSIS — F330 Major depressive disorder, recurrent, mild: Secondary | ICD-10-CM

## 2022-01-05 DIAGNOSIS — G4700 Insomnia, unspecified: Secondary | ICD-10-CM

## 2022-01-05 DIAGNOSIS — F411 Generalized anxiety disorder: Secondary | ICD-10-CM

## 2022-01-06 LAB — TSH: TSH: 1.31 u[IU]/mL (ref 0.450–4.500)

## 2022-01-08 ENCOUNTER — Ambulatory Visit: Payer: BC Managed Care – PPO | Admitting: Psychology

## 2022-01-11 ENCOUNTER — Ambulatory Visit (INDEPENDENT_AMBULATORY_CARE_PROVIDER_SITE_OTHER): Payer: BC Managed Care – PPO | Admitting: Psychology

## 2022-01-11 DIAGNOSIS — F411 Generalized anxiety disorder: Secondary | ICD-10-CM

## 2022-01-11 DIAGNOSIS — F330 Major depressive disorder, recurrent, mild: Secondary | ICD-10-CM | POA: Diagnosis not present

## 2022-01-11 NOTE — Progress Notes (Signed)
[facility] Counselor/Therapist Progress Note ? ?Patient ID: Brandon Jackson, MRN: [id number]   ? ?Date: 01/11/22 ? ?Time Spent: 9:02  am - 9:52 am : 50 Minutes ? ?Treatment Type: Individual Therapy. ? ?Reported Symptoms: Sadness.  ? ?Mental Status Exam: ?Appearance:  Neat and Well Groomed     ?Behavior: Appropriate  ?Motor: Normal  ?Speech/Language:  Clear and Coherent and Normal Rate  ?Affect: Congruent and Depressed  ?Mood: anxious and dysthymic  ?Thought process: normal  ?Thought content:   WNL  ?Sensory/Perceptual disturbances:   WNL  ?Orientation: oriented to person, place, time/date, and situation  ?Attention: Good  ?Concentration: Good  ?Memory: WNL  ?Fund of knowledge:  Good  ?Insight:   Good  ?Judgment:  Good  ?Impulse Control: Good  ? ?Risk Assessment: ?Danger to Self:  No ?Self-injurious Behavior: No ?Danger to Others: No ?Duty to Warn:no ?Physical Aggression / Violence:No  ?Access to Firearms a concern: No  ?Gang Involvement:No  ? ?Subjective:  ? ?[name] participated from office, via video, and consented to treatment. Therapist participated from home office. We met online due to [place] pandemic. Zian reviewed the events of the past week. Khalif provided an update regarding work and personal life. Kingdavid noted working on building comfort saying no to others. We discussed his attempts to manage his negative self-talk regarding stressors.  We worked on continuing to process his transition, specifically his separation from his husband, and his intent to move out of the home at some point.  We worked on identifying boundaries going forward communicating positively and assertively.  Therapist encouraged Zubayr to work on addressing his own needs and building stability via self-care and socializing healthful eating and good sleep.  We processed his work-related stressors and worked on identifying ways to communicate his supervisor positively, assertively, and effectively.   Ezio endorsed anxiety related to work and his attempts to manage this.  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  Therapist validated and normalized Caedyn's feelings and experience.  We worked on reviewing coping skills and boundary setting for self and others.  Chandlar was engaged and motivated during the session and expressed commitment towards her goals.  Therapist praised Kepler for his effort and energy and provided supportive therapy.  A follow-up was scheduled.  Daxen continues to benefit from individual therapy.    ? ?Interventions: Interpersonal ? ?Diagnosis: ? ?Mild episode of recurrent major depressive disorder ([place]) ? ?GAD (generalized anxiety disorder) ? ? ?Plan: Patient is to use CBT, BA, ACT, mindfulness, and coping skills to help manage decrease symptoms associated with their diagnosis. ? ?Related Problem: Develop healthy thinking patterns and beliefs about self, others, and the world that lead to the alleviation and help prevent the relapse of depression. ?Description: Verbalize insight into how past relationships may be influencing current experiences with depression. ?Target Date: 2022-07-06 ?Progress: 30% ? ?Related Problem: Develop healthy thinking patterns and beliefs about self, others, and the world that lead to the alleviation and help prevent the relapse of depression. ?Description: Cooperate with a medication evaluation by a physician. ?Target Date: 2022-07-06 ?Progress: 100% ? ?Related Problem: Develop healthy thinking patterns and beliefs about self, others, and the world that lead to the alleviation and help prevent the relapse of depression. ?Description: Verbalize an understanding and resolution of current interpersonal problems. ?Target Date: 2022-07-06 ?Progress: 40% ? ?Related Problem: Develop healthy thinking patterns and beliefs about self, others, and  the world that lead to the  alleviation and help prevent the relapse of depression. ?Description: Learn and implement behavioral strategies to overcome depression. ?Target Date: 2022-07-06 ?Progress: 40% ? ?Related Problem: Develop healthy thinking patterns and beliefs about self, others, and the world that lead to the alleviation and help prevent the relapse of depression. ?Description: Identify and replace thoughts and beliefs that support depression. ?Target Date: 2022-07-06 ?Progress: 40% ? ?Related Problem: Develop healthy thinking patterns and beliefs about self, others, and the world that lead to the alleviation and help prevent the relapse of depression. ?Description: Learn and implement conflict resolution skills to resolve interpersonal problems. ?Target Date: 2022-07-06 ?Progress: 40% ? ?Related Problem: Reach a balance between work/competitive and social/noncompetitive time in daily life. ?Description: Increase daily time involved in relaxing activities. ?Target Date: 2022-07-06 ?Progress: 20% ? ?Related Problem: Reach a balance between work/competitive and social/noncompetitive time in daily life. ?Description: Decrease the number of hours worked daily and the frequency of taking work home. ?Target Date: 2022-07-06 ?Progress: 50% ? ?Related Problem: Reduce overall frequency, intensity, and duration of the anxiety so that daily functioning is not impaired. ?Description: Identify, challenge, and replace biased, fearful self-talk with positive, realistic, and empowering self-talk. ?Target Date: 2022-07-06 ?Progress: 30% ? ?Related Problem: Reduce overall frequency, intensity, and duration of the anxiety so that daily functioning is not impaired. ?Description: Describe situations, thoughts, feelings, and actions associated with anxieties and worries, their impact on functioning, and attempts to resolve them. ?Target Date: 2022-07-06 ?Progress: 40% ? ?Related Problem: Reduce overall frequency, intensity, and duration of the anxiety so  that daily functioning is not impaired. ?Description: Learn and implement problem-solving strategies for realistically addressing worries. ?Target Date: 2022-07-06 ?Progress: 40% ? ?Related Problem: Reduce overall frequency, intensity, and duration of the anxiety so that daily functioning is not impaired. ?Description: Learn and implement personal and interpersonal skills to reduce anxiety and improve interpersonal relationships. ?Target Date: 2022-07-06 ?Progress: 30% ? ? ?Related Problem: Reduce overall frequency, intensity, and duration of the anxiety so that daily functioning is not impaired. ?Description: Learn and implement relapse prevention strategies for managing possible future anxiety symptoms. ?Target Date: 2022-07-06 ?Progress: 50% ? ?Related Problem: Reduce overall frequency, intensity, and duration of the anxiety so that daily functioning is not impaired. ?Description: Identify and engage in pleasant activities on a daily basis. ?Target Date: 2022-07-06 ?Progress: 50% ? ?[name], LCSW ?

## 2022-01-13 ENCOUNTER — Encounter: Payer: Self-pay | Admitting: Psychiatry

## 2022-01-13 ENCOUNTER — Other Ambulatory Visit: Payer: Self-pay

## 2022-01-13 ENCOUNTER — Telehealth (INDEPENDENT_AMBULATORY_CARE_PROVIDER_SITE_OTHER): Payer: BC Managed Care – PPO | Admitting: Psychiatry

## 2022-01-13 DIAGNOSIS — F330 Major depressive disorder, recurrent, mild: Secondary | ICD-10-CM

## 2022-01-13 DIAGNOSIS — G4700 Insomnia, unspecified: Secondary | ICD-10-CM | POA: Diagnosis not present

## 2022-01-13 DIAGNOSIS — F411 Generalized anxiety disorder: Secondary | ICD-10-CM | POA: Diagnosis not present

## 2022-01-13 MED ORDER — ESZOPICLONE 1 MG PO TABS: 1.0000 mg | ORAL_TABLET | Freq: Every evening | ORAL | 1 refills | Status: DC | PRN

## 2022-01-13 NOTE — Progress Notes (Signed)
Virtual Visit via Video Note  I connected with Brandon Jackson on 01/13/22 at  1:30 PM EST by a video enabled telemedicine application and verified that I am speaking with the correct person using two identifiers. Location Provider Location : [facility] Patient Location : Home  Participants: Patient , Provider    I discussed the limitations of evaluation and management by telemedicine and the availability of in person appointments. The patient expressed understanding and agreed to proceed.   I discussed the assessment and treatment plan with the patient. The patient was provided an opportunity to ask questions and all were answered. The patient agreed with the plan and demonstrated an understanding of the instructions.   The patient was advised to call back or seek an in-person evaluation if the symptoms worsen or if the condition fails to improve as anticipated.    [facility] MD OP Progress Note  01/13/2022 2:36 PM Brandon Jackson  MRN:  [id number]  Chief Complaint:  Chief Complaint  Patient presents with   Follow-up: 41 year old Caucasian male with history of MDD, GAD, insomnia, presented for medication management.   HPI: Brandon Jackson is a 41 year old Caucasian male who currently lives with his partner, employed with [employer], [place] in [place], has a history of MDD, GAD, insomnia, hyperlipidemia was evaluated by telemedicine today.  Patient today reports he continues to grieve the loss of his relationship with his partner with whom he is separated.  Patient reports it is painful since they continue to live in the same home and he sees them every day.  He however is planning to move out at least by April.  Looks forward to that.  Does report racing thoughts, feeling anxious and overwhelmed not only due to the separation but also due to work-related stressors.  Spring semester is usually very stressful.  Patient however reports he has been coping well with his work-related  problems and is managing it okay.  Continues to take his medications as prescribed.  He may have used the Klonopin couple of times a week since his last visit with Clinical research associatewriter.  Denies side effects.  Does have some days when he feels sad and down because of his current stressors however denies any anhedonia.  Denies any appetite changes.  Sleep is currently improving.  He currently takes [place]unesta.  Denies side effects.  Patient denies any suicidality, homicidality or perceptual disturbances.  Continues to follow-up with his therapist Mr.Mostafa, reports therapy sessions are beneficial.   Visit Diagnosis:    ICD-10-CM   1. Mild episode of recurrent major depressive disorder (HCC)  F33.0     2. GAD (generalized anxiety disorder)  F41.1     3. Insomnia, unspecified type  G47.00 eszopiclone (LUNESTA) 1 MG TABS tablet      Past Psychiatric History: Reviewed past psychiatric history from progress note on 12/23/2021.  Past trials of Lexapro, Effexor, Wellbutrin, Lunesta, trazodone.  Past Medical History:  Past Medical History:  Diagnosis Date   Anxiety    Depression    Frequent headaches    History of chicken pox    Migraines    Ulcer     Past Surgical History:  Procedure Laterality Date   CYSTECTOMY  2003   TONSILLECTOMY AND ADENOIDECTOMY  2007   Wisdom Teeth Removal  1998    Family Psychiatric History: Reviewed family psychiatric history from progress note on 12/23/2021.  Family History:  Family History  Problem Relation Age of Onset   Depression Mother  Diabetes Father    Hyperlipidemia Father    Hypertension Father    Colon cancer Neg Hx    Prostate cancer Neg Hx     Social History: Reviewed social history from progress note from 12/23/2021. Social History   Socioeconomic History   Marital status: Significant Other    Spouse name: Not on file   Number of children: Not on file   Years of education: College   Highest education level: Not on file  Occupational History    Occupation: communications    Employer: [employer]  Tobacco Use   Smoking status: Former    Types: Cigarettes    Quit date: 2013    Years since quitting: 10.1    Passive exposure: Past   Smokeless tobacco: Never  Vaping Use   Vaping Use: Never used  Substance and Sexual Activity   Alcohol use: Yes    Comment: occasional   Drug use: No   Sexual activity: Not on file  Other Topics Concern   Not on file  Social History Narrative   In committed relationship since 2007   [employer] grad '03   Working at [employer] as of 2019, 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     Social Determinants of Health   Financial Resource Strain: Not on file  Food Insecurity: Not on file  Transportation Needs: Not on file  Physical Activity: Not on file  Stress: Not on file  Social Connections: Not on file    Allergies:  Allergies  Allergen Reactions   Wellbutrin [Bupropion] Other (See Comments)    Anxiety increased on med when taken alone.  He can tolerate wellbutrin when taken with lexparo.    Erythromycin Diarrhea and Rash    Metabolic Disorder Labs: No results found for: HGBA1C, MPG No results found for: PROLACTIN Lab Results  Component Value Date   CHOL 148 10/12/2021   TRIG 112.0 10/12/2021   HDL 43.80 10/12/2021   CHOLHDL 3 10/12/2021   VLDL 22.4 10/12/2021   LDLCALC 82 10/12/2021   LDLCALC 107 (H) 01/24/2018   Lab Results  Component Value Date   TSH 1.310 01/05/2022    Therapeutic Level Labs: No results found for: LITHIUM No results found for: VALPROATE No components found for:  CBMZ  Current Medications: Current Outpatient Medications  Medication Sig Dispense Refill   allopurinol (ZYLOPRIM) 100 MG tablet TAKE 1 TABLET BY MOUTH TWICE A DAY 60 tablet 6   buPROPion (WELLBUTRIN SR) 150 MG 12 hr tablet Take 1 tablet (150 mg total) by mouth 2 (two) times daily.     busPIRone (BUSPAR) 5 MG tablet TAKE 1 TABLET BY MOUTH 2 (TWO) TIMES DAILY. INCREASE TO 2 TABS TWICE A DAY AFTER 7  DAYS IF NEEDED. 360 tablet 2   cetirizine (ZYRTEC) 10 MG tablet Take 1 tablet (10 mg total) by mouth daily. 30 tablet 0   clonazePAM (KLONOPIN) 0.5 MG tablet 0.5-1 TAB TWICE A DAY IF NEEDED FOR ANXIETY CAN TAKE 0.5-1 TAB AT NIGHT FOR INSOMNIA. SEDATION CAUTION 20 tablet 1   colchicine 0.6 MG tablet 2 po at onset of pain, may repeat 1 tab in 1 hour if still hurting- no more then 3 tablets in 24 hours 12 tablet 1   emtricitabine-tenofovir (TRUVADA) 200-300 MG tablet Take 1 tablet by mouth daily. 90 tablet 3   eszopiclone (LUNESTA) 1 MG TABS tablet Take 1 tablet (1 mg total) by mouth at bedtime as needed for sleep. Take immediately before bedtime 30 tablet 1  Multiple Vitamin (MULTIVITAMIN) tablet Take 1 tablet by mouth daily.     sulfamethoxazole-trimethoprim (BACTRIM DS) 800-160 MG tablet Take 1 tablet by mouth 2 (two) times daily.     valACYclovir (VALTREX) 1000 MG tablet Take 1,000 mg by mouth 2 (two) times daily.     No current facility-administered medications for this visit.     Musculoskeletal: Strength & Muscle Tone:  UTA Gait & Station:  Seated Patient leans: N/A  Psychiatric Specialty Exam: Review of Systems  Psychiatric/Behavioral:  Positive for sleep disturbance (Improving). The patient is nervous/anxious.   All other systems reviewed and are negative.  There were no vitals taken for this visit.There is no height or weight on file to calculate BMI.  General Appearance: Casual  Eye Contact:  Fair  Speech:  Clear and Coherent  Volume:  Normal  Mood:  Anxious  Affect:  Congruent  Thought Process:  Goal Directed and Descriptions of Associations: Intact  Orientation:  Full (Time, Place, and Person)  Thought Content: Logical   Suicidal Thoughts:  No  Homicidal Thoughts:  No  Memory:  Immediate;   Fair Recent;   Fair Remote;   Fair  Judgement:  Fair  Insight:  Fair  Psychomotor Activity:  Normal  Concentration:  Concentration: Fair and Attention Span: Fair  Recall:  [employer] of Knowledge: Fair  Language: Fair  Akathisia:  No  Handed:  Right  AIMS (if indicated): done, 0  Assets:  Communication Skills Desire for Improvement Housing Intimacy Resilience Social Support Talents/Skills Transportation Vocational/Educational  ADL's:  Intact  Cognition: WNL  Sleep:   Improving   Screenings: GAD-7    Flowsheet Row Video Visit from 01/13/2022 in [facility] Office Visit from 12/23/2021 in [facility] Office Visit from 09/01/2021 in [facility] Office Visit from 08/06/2021 in [facility]  Total GAD-7 Score 7 14 17 19       PHQ2-9    Flowsheet Row Video Visit from 01/13/2022 in [facility] Office Visit from 12/23/2021 in [facility] Office Visit from 09/01/2021 in [facility] Visit from 01/26/2018 in [facility] Visit from 01/24/2017 in [facility] Total Score 1 1 4  0 4  PHQ-9 Total Score 4 7 19  -- 12      Flowsheet Row Office Visit from 12/23/2021 in [facility] ED from 01/02/2021 in [facility]   C-SSRS RISK CATEGORY No Risk No Risk        Assessment and Plan: Brandon Jackson is a 41 year old Caucasian male, employed, lives in [place], has a history of MDD, GAD, presented for medication management.  Patient is currently continues to struggle with psychosocial stressors of separation from his partner although they continue to live in the same house, work-related stressors, however with good response to medication changes, will benefit from the following plan.  Plan MDD-in partial remission Wellbutrin SR 150 mg p.o. twice daily BuSpar 10 mg p.o. twice daily  GAD-improving BuSpar 10 mg p.o. twice daily Klonopin 0.5 mg as needed, advised to  limit use Continue CBT with Mr.Mostafa  Insomnia-improving Lunesta 1 mg p.o. nightly Reviewed [place] PMP aware  Reviewed and discussed TSH-dated 01/05/2022-within normal limits.  Follow-up in clinic in 6 to 7 weeks or sooner if needed.  Collaboration of Care: Collaboration of Care: Referral or follow-up with counselor/therapist  AEB encouraged to continue to follow up with therapist.  Patient/Guardian was advised Release of Information must be obtained prior to any record release in order to collaborate their care with an outside provider. Patient/Guardian was advised if they have not already done so to contact the registration department to sign all necessary forms in order for [place] to release information regarding their care.   Consent: Patient/Guardian gives verbal consent for treatment and assignment of benefits for services provided during this visit. Patient/Guardian expressed understanding and agreed to proceed.   This note was generated in part or whole with voice recognition software. Voice recognition is usually quite accurate but there are transcription errors that can and very often do occur. I apologize for any typographical errors that were not detected and corrected.    Jomarie Longs, MD 01/13/2022, 2:36 PM

## 2022-01-21 ENCOUNTER — Other Ambulatory Visit: Payer: Self-pay

## 2022-01-21 MED ORDER — BUPROPION HCL ER (SR) 150 MG PO TB12: 150.0000 mg | ORAL_TABLET | Freq: Two times a day (BID) | ORAL | 1 refills | Status: DC

## 2022-01-21 NOTE — Telephone Encounter (Signed)
Patient called to get refill on Bupropion. This has been done. Wanted to see when patient should follow up. Patient was not sure. ?

## 2022-01-24 NOTE — Telephone Encounter (Signed)
Please check with patient about follow up.  If doing well, then f/u this summer is reasonable.  I saw his most recent notes from Dr. Shea Evans.  If needed, then please schedule f/u here sooner.  Thanks.  ?

## 2022-01-27 NOTE — Telephone Encounter (Signed)
Reviewed with patient. Have made appointment will reach our if he needs to be seen sooner.  ?

## 2022-01-27 NOTE — Telephone Encounter (Signed)
Called lvm for patient to call us back. 

## 2022-02-05 ENCOUNTER — Ambulatory Visit (INDEPENDENT_AMBULATORY_CARE_PROVIDER_SITE_OTHER): Payer: BC Managed Care – PPO | Admitting: Psychology

## 2022-02-05 DIAGNOSIS — F330 Major depressive disorder, recurrent, mild: Secondary | ICD-10-CM

## 2022-02-05 DIAGNOSIS — F411 Generalized anxiety disorder: Secondary | ICD-10-CM

## 2022-02-05 NOTE — Progress Notes (Signed)
[facility] Counselor/Therapist Progress Note ? ?Patient ID: Brandon Jackson, MRN: [id number]   ? ?Date: 02/05/22 ? ?Time Spent: 4:03 pm - 4:55 pm : 52 Minutes ? ?Treatment Type: Individual Therapy. ? ?Reported Symptoms: Depression and anxiety. ? ?Mental Status Exam: ?Appearance:  Neat and Well Groomed     ?Behavior: Appropriate  ?Motor: Normal  ?Speech/Language:  Clear and Coherent and Normal Rate  ?Affect: Congruent and Depressed  ?Mood: anxious and dysthymic  ?Thought process: normal  ?Thought content:   WNL  ?Sensory/Perceptual disturbances:   WNL  ?Orientation: oriented to person, place, time/date, and situation  ?Attention: Good  ?Concentration: Good  ?Memory: WNL  ?Fund of knowledge:  Good  ?Insight:   Good  ?Judgment:  Good  ?Impulse Control: Good  ? ?Risk Assessment: ?Danger to Self:  No ?Self-injurious Behavior: No ?Danger to Others: No ?Duty to Warn:no ?Physical Aggression / Violence:No  ?Access to Firearms a concern: No  ?Gang Involvement:No  ? ?Subjective:  ? ?[name] participated from office, via video, and consented to treatment. Therapist participated from home office. We met online due to [place] pandemic. Kaesyn reviewed the events of the past week. Rieley noted a recent transition, renting a new apartment, and being excited and scared. Zailen noted the decline of his dog's health and the possibility of having to euthanize him. Torsten noted increased worry regarding his ex and his ability to manage adult responsibilities, finances, and functioning day-to-day. He noted getting exercise and while taking his medication, he still struggles with sleep, at times. We discussed possible contributing factors to his sleep and reviewed ways to manage sleep, using sleep hygiene, going forward. Therapist validated and normalized Aleksi's feelings regarding his breakup and the numerous transitions. Autry noted becoming more social and noted enjoyment of this. Therapist praised  Vonte for his effort in this space.  Therapist encouraged Vartan to engage in consistent self-care. Therapist praised Kalief for his effort and energy and provided supportive therapy.  A follow-up was scheduled.  Leyton continues to benefit from individual therapy.    ? ?Interventions: Interpersonal ? ?Diagnosis: ? ?Mild episode of recurrent major depressive disorder ([place]) ? ?GAD (generalized anxiety disorder) ? ? ?Plan: Patient is to use CBT, BA, ACT, mindfulness, and coping skills to help manage decrease symptoms associated with their diagnosis. ? ?Related Problem: Develop healthy thinking patterns and beliefs about self, others, and the world that lead to the alleviation and help prevent the relapse of depression. ?Description: Verbalize insight into how past relationships may be influencing current experiences with depression. ?Target Date: 2022-07-06 ?Progress: 30% ? ?Related Problem: Develop healthy thinking patterns and beliefs about self, others, and the world that lead to the alleviation and help prevent the relapse of depression. ?Description: Cooperate with a medication evaluation by a physician. ?Target Date: 2022-07-06 ?Progress: 100% ? ?Related Problem: Develop healthy thinking patterns and beliefs about self, others, and the world that lead to the alleviation and help prevent the relapse of depression. ?Description: Verbalize an understanding and resolution of current interpersonal problems. ?Target Date: 2022-07-06 ?Progress: 40% ? ?Related Problem: Develop healthy thinking patterns and beliefs about self, others, and the world that lead to the alleviation and help prevent the relapse of depression. ?Description: Learn and implement behavioral strategies to overcome depression. ?Target Date: 2022-07-06 ?Progress: 40% ? ?Related Problem: Develop healthy thinking patterns and beliefs about self, others, and the world that lead to the alleviation and help prevent the relapse of  depression. ?Description: Identify and replace thoughts  and beliefs that support depression. ?Target Date: 2022-07-06 ?Progress: 40% ? ?Related Problem: Develop healthy thinking patterns and beliefs about self, others, and the world that lead to the alleviation and help prevent the relapse of depression. ?Description: Learn and implement conflict resolution skills to resolve interpersonal problems. ?Target Date: 2022-07-06 ?Progress: 40% ? ?Related Problem: Reach a balance between work/competitive and social/noncompetitive time in daily life. ?Description: Increase daily time involved in relaxing activities. ?Target Date: 2022-07-06 ?Progress: 20% ? ?Related Problem: Reach a balance between work/competitive and social/noncompetitive time in daily life. ?Description: Decrease the number of hours worked daily and the frequency of taking work home. ?Target Date: 2022-07-06 ?Progress: 50% ? ?Related Problem: Reduce overall frequency, intensity, and duration of the anxiety so that daily functioning is not impaired. ?Description: Identify, challenge, and replace biased, fearful self-talk with positive, realistic, and empowering self-talk. ?Target Date: 2022-07-06 ?Progress: 30% ? ?Related Problem: Reduce overall frequency, intensity, and duration of the anxiety so that daily functioning is not impaired. ?Description: Describe situations, thoughts, feelings, and actions associated with anxieties and worries, their impact on functioning, and attempts to resolve them. ?Target Date: 2022-07-06 ?Progress: 40% ? ?Related Problem: Reduce overall frequency, intensity, and duration of the anxiety so that daily functioning is not impaired. ?Description: Learn and implement problem-solving strategies for realistically addressing worries. ?Target Date: 2022-07-06 ?Progress: 40% ? ?Related Problem: Reduce overall frequency, intensity, and duration of the anxiety so that daily functioning is not impaired. ?Description: Learn and  implement personal and interpersonal skills to reduce anxiety and improve interpersonal relationships. ?Target Date: 2022-07-06 ?Progress: 30% ? ? ?Related Problem: Reduce overall frequency, intensity, and duration of the anxiety so that daily functioning is not impaired. ?Description: Learn and implement relapse prevention strategies for managing possible future anxiety symptoms. ?Target Date: 2022-07-06 ?Progress: 50% ? ?Related Problem: Reduce overall frequency, intensity, and duration of the anxiety so that daily functioning is not impaired. ?Description: Identify and engage in pleasant activities on a daily basis. ?Target Date: 2022-07-06 ?Progress: 50% ? ?[name], LCSW ?

## 2022-03-03 ENCOUNTER — Telehealth (INDEPENDENT_AMBULATORY_CARE_PROVIDER_SITE_OTHER): Payer: BC Managed Care – PPO | Admitting: Psychiatry

## 2022-03-03 ENCOUNTER — Encounter: Payer: Self-pay | Admitting: Psychiatry

## 2022-03-03 DIAGNOSIS — F3342 Major depressive disorder, recurrent, in full remission: Secondary | ICD-10-CM | POA: Diagnosis not present

## 2022-03-03 DIAGNOSIS — F99 Mental disorder, not otherwise specified: Secondary | ICD-10-CM | POA: Diagnosis not present

## 2022-03-03 DIAGNOSIS — F5105 Insomnia due to other mental disorder: Secondary | ICD-10-CM

## 2022-03-03 DIAGNOSIS — F330 Major depressive disorder, recurrent, mild: Secondary | ICD-10-CM | POA: Insufficient documentation

## 2022-03-03 DIAGNOSIS — F411 Generalized anxiety disorder: Secondary | ICD-10-CM | POA: Diagnosis not present

## 2022-03-03 NOTE — Progress Notes (Signed)
Virtual Visit via Video Note ? ?I connected with Brandon Jackson on 03/03/22 at  9:30 AM EDT by a video enabled telemedicine application and verified that I am speaking with the correct person using two identifiers. ? ?Location ?Provider Location : [facility] ?Patient Location : Home ? ?Participants: Patient , Provider ? ?  ?I discussed the limitations of evaluation and management by telemedicine and the availability of in person appointments. The patient expressed understanding and agreed to proceed. ?  ?I discussed the assessment and treatment plan with the patient. The patient was provided an opportunity to ask questions and all were answered. The patient agreed with the plan and demonstrated an understanding of the instructions. ?  ?The patient was advised to call back or seek an in-person evaluation if the symptoms worsen or if the condition fails to improve as anticipated. ? ? ? ?[facility] MD OP Progress Note ? ?03/03/2022 9:57 AM ?Brandon Jackson  ?MRN:  [id number] ? ?Chief Complaint:  ?Chief Complaint  ?Patient presents with  ? Follow-up: 41 year old Caucasian male with history of MDD, GAD, insomnia, presented for medication management.  ? ?HPI: Brandon Jackson is a 41 year old Caucasian male who currently is employed, lives in [place], has a history of MDD, GAD, insomnia, hyperlipidemia was evaluated by telemedicine today. ? ?Patient reports he is currently officially separated from his partner.  He moved into a new apartment over the weekend.  Patient continues to grieve the loss of this relationship and reports he feels overwhelmed and sad often.  Patient continues to follow-up with therapist.  Has upcoming appointment.  Agreeable to continue to work with this therapist on his grief.  Patient also reports work continues to be busy.  This being the spring semester he is extra busy. His anxiety does have an impact on his focus and concentration and ability to work although he has been managing so  far. ? ?Patient reports sleep is good.  Uses the Lunesta as needed only. ? ?Currently compliant on the Wellbutrin, BuSpar.  He is not interested in changing the dosages of his medication at this time.  Worried about side effects and hence wants to stay on this dose for now.  Does have clonazepam available which he takes as needed, currently limiting use. ? ?Denies suicidality, homicidality or perceptual disturbances. ? ?Denies any other concerns today. ? ?Visit Diagnosis:  ?  ICD-10-CM   ?1. MDD (major depressive disorder), recurrent, in full remission (HCC)  F33.42   ?  ?2. GAD (generalized anxiety disorder)  F41.1   ?  ?3. Insomnia due to other mental disorder  F51.05   ? F99   ? anxiety  ?  ? ? ?Past Psychiatric History: Reviewed past psychiatric history from progress note on 12/23/2021.  Past trials of Lexapro, Effexor, Wellbutrin, Lunesta, trazodone. ? ?Past Medical History:  ?Past Medical History:  ?Diagnosis Date  ? Anxiety   ? Depression   ? Frequent headaches   ? History of chicken pox   ? Migraines   ? Ulcer   ?  ?Past Surgical History:  ?Procedure Laterality Date  ? CYSTECTOMY  2003  ? TONSILLECTOMY AND ADENOIDECTOMY  2007  ? Wisdom Teeth Removal  1998  ? ? ?Family Psychiatric History: Reviewed family psychiatric history from progress note on 12/23/2021. ? ?Family History:  ?Family History  ?Problem Relation Age of Onset  ? Depression Mother   ? Diabetes Father   ? Hyperlipidemia Father   ? Hypertension Father   ? Colon cancer  Neg Hx   ? Prostate cancer Neg Hx   ? ? ?Social History: Reviewed social history from progress note on 12/23/2021. ?Social History  ? ?Socioeconomic History  ? Marital status: Significant Other  ?  Spouse name: Not on file  ? Number of children: Not on file  ? Years of education: College  ? Highest education level: Not on file  ?Occupational History  ? Occupation: communications  ?  Employer: [employer]  ?Tobacco Use  ? Smoking status: Former  ?  Types: Cigarettes  ?  Quit date:  2013  ?  Years since quitting: 10.3  ?  Passive exposure: Past  ? Smokeless tobacco: Never  ?Vaping Use  ? Vaping Use: Never used  ?Substance and Sexual Activity  ? Alcohol use: Yes  ?  Comment: occasional  ? Drug use: No  ? Sexual activity: Not on file  ?Other Topics Concern  ? Not on file  ?Social History Narrative  ? In committed relationship since 2007  ? [employer] grad '03  ? 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    ? ?Social Determinants of Health  ? ?Financial Resource Strain: Not on file  ?Food Insecurity: Not on file  ?Transportation Needs: Not on file  ?Physical Activity: Not on file  ?Stress: Not on file  ?Social Connections: Not on file  ? ? ?Allergies:  ?Allergies  ?Allergen Reactions  ? Wellbutrin [Bupropion] Other (See Comments)  ?  Anxiety increased on med when taken alone.  He can tolerate wellbutrin when taken with lexparo.   ? Erythromycin Diarrhea and Rash  ? ? ?Metabolic Disorder Labs: ?No results found for: HGBA1C, MPG ?No results found for: PROLACTIN ?Lab Results  ?Component Value Date  ? CHOL 148 10/12/2021  ? TRIG 112.0 10/12/2021  ? HDL 43.80 10/12/2021  ? CHOLHDL 3 10/12/2021  ? VLDL 22.4 10/12/2021  ? LDLCALC 82 10/12/2021  ? LDLCALC 107 (H) 01/24/2018  ? ?Lab Results  ?Component Value Date  ? TSH 1.310 01/05/2022  ? ? ?Therapeutic Level Labs: ?No results found for: LITHIUM ?No results found for: VALPROATE ?No components found for:  CBMZ ? ?Current Medications: ?Current Outpatient Medications  ?Medication Sig Dispense Refill  ? allopurinol (ZYLOPRIM) 100 MG tablet TAKE 1 TABLET BY MOUTH TWICE A DAY 60 tablet 6  ? buPROPion (WELLBUTRIN SR) 150 MG 12 hr tablet Take 1 tablet (150 mg total) by mouth 2 (two) times daily. 180 tablet 1  ? busPIRone (BUSPAR) 5 MG tablet TAKE 1 TABLET BY MOUTH 2 (TWO) TIMES DAILY. INCREASE TO 2 TABS TWICE A DAY AFTER 7 DAYS IF NEEDED. 360 tablet 2  ? cetirizine (ZYRTEC) 10 MG tablet Take 1 tablet (10 mg total) by mouth daily. 30 tablet 0  ?  clonazePAM (KLONOPIN) 0.5 MG tablet 0.5-1 TAB TWICE A DAY IF NEEDED FOR ANXIETY CAN TAKE 0.5-1 TAB AT NIGHT FOR INSOMNIA. SEDATION CAUTION 20 tablet 1  ? colchicine 0.6 MG tablet 2 po at onset of pain, may repeat 1 tab in 1 hour if still hurting- no more then 3 tablets in 24 hours 12 tablet 1  ? emtricitabine-tenofovir (TRUVADA) 200-300 MG tablet Take 1 tablet by mouth daily. 90 tablet 3  ? eszopiclone (LUNESTA) 1 MG TABS tablet Take 1 tablet (1 mg total) by mouth at bedtime as needed for sleep. Take immediately before bedtime 30 tablet 1  ? Multiple Vitamin (MULTIVITAMIN) tablet Take 1 tablet by mouth daily.    ? sulfamethoxazole-trimethoprim (  BACTRIM DS) 800-160 MG tablet Take 1 tablet by mouth 2 (two) times daily.    ? valACYclovir (VALTREX) 1000 MG tablet Take 1,000 mg by mouth 2 (two) times daily.    ? ?No current facility-administered medications for this visit.  ? ? ? ?Musculoskeletal: ?Strength & Muscle Tone:  UTA ?Gait & Station:  Seated ?Patient leans: N/A ? ?Psychiatric Specialty Exam: ?Review of Systems  ?Psychiatric/Behavioral:  The patient is nervous/anxious.   ?All other systems reviewed and are negative.  ?There were no vitals taken for this visit.There is no height or weight on file to calculate BMI.  ?General Appearance: Casual  ?Eye Contact:  Fair  ?Speech:  Clear and Coherent  ?Volume:  Normal  ?Mood:  Anxious  ?Affect:  Congruent  ?Thought Process:  Goal Directed and Descriptions of Associations: Intact  ?Orientation:  Full (Time, Place, and Person)  ?Thought Content: Logical   ?Suicidal Thoughts:  No  ?Homicidal Thoughts:  No  ?Memory:  Immediate;   Fair ?Recent;   Fair ?Remote;   Fair  ?Judgement:  Fair  ?Insight:  Fair  ?Psychomotor Activity:  Normal  ?Concentration:  Concentration: Fair and Attention Span: Fair  ?Recall:  Fair  ?Fund of Knowledge: Fair  ?Language: Fair  ?Akathisia:  No  ?Handed:  Right  ?AIMS (if indicated): done  ?Assets:  Communication Skills ?Desire for  Improvement ?Housing ?Social Support  ?ADL's:  Intact  ?Cognition: WNL  ?Sleep:  Fair  ? ?Screenings: ?AIMS   ? ?Flowsheet Row Video Visit from 03/03/2022 in [facility]  ?AIMS Total Score 0  ? ?  ? ?GAD-7

## 2022-03-12 ENCOUNTER — Ambulatory Visit (INDEPENDENT_AMBULATORY_CARE_PROVIDER_SITE_OTHER): Payer: BC Managed Care – PPO | Admitting: Psychology

## 2022-03-12 DIAGNOSIS — F3342 Major depressive disorder, recurrent, in full remission: Secondary | ICD-10-CM | POA: Diagnosis not present

## 2022-03-12 DIAGNOSIS — F411 Generalized anxiety disorder: Secondary | ICD-10-CM

## 2022-03-12 NOTE — Progress Notes (Signed)
[facility] Counselor/Therapist Progress Note ? ?Patient ID: Brandon Jackson, MRN: [id number]   ? ?Date: 03/12/22 ? ?Time Spent: 8:05 am - 9:01 am :56 Minutes ? ?Treatment Type: Individual Therapy. ? ?Reported Symptoms: Depression and anxiety. ? ?Mental Status Exam: ?Appearance:  Neat and Well Groomed     ?Behavior: Appropriate  ?Motor: Normal  ?Speech/Language:  Clear and Coherent and Normal Rate  ?Affect: Congruent  ?Mood: dysthymic  ?Thought process: normal  ?Thought content:   WNL  ?Sensory/Perceptual disturbances:   WNL  ?Orientation: oriented to person, place, time/date, and situation  ?Attention: Good  ?Concentration: Good  ?Memory: WNL  ?Fund of knowledge:  Good  ?Insight:   Good  ?Judgment:  Good  ?Impulse Control: Good  ? ?Risk Assessment: ?Danger to Self:  No ?Self-injurious Behavior: No ?Danger to Others: No ?Duty to Warn:no ?Physical Aggression / Violence:No  ?Access to Firearms a concern: No  ?Gang Involvement:No  ? ?Subjective:  ? ?Brandon Jackson participated from home, via video, and consented to treatment. Therapist participated from home office. We met online due to Brandon Jackson pandemic. Al reviewed the events of the past week. Brandon Jackson noted a recent transition to his new home living alone. He noted the difficulty with the transition. He noted reflecting on the relationship and was tearful during the session. He noted feelings of guilt and sadness. We continued to explore this during the session. We discussed his transition leaving the home and into his own place. 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 We discussed the importance of creating a routine, identify boundaries going forward for self and others, and engaging in self-care & enjoyable activities on a consistent basis. Therapist praised Brandon Jackson for his effort and energy and provided supportive therapy.  A follow-up was scheduled.  Brandon Jackson continues to benefit from individual therapy.  Brandon Jackson any SI or any recent medication changes.  ? ?Interventions: Cognitive Behavioral Therapy and Interpersonal ? ?Diagnosis: ? ?MDD (major depressive disorder), recurrent, in full remission ([place]) ? ?GAD (generalized anxiety disorder) ? ? ?Plan: Patient is to use CBT, BA, ACT, mindfulness, and coping skills to help manage decrease symptoms associated with their diagnosis. ? ?Related Problem: Develop healthy thinking patterns and beliefs about self, others, and the world that lead to the alleviation and help prevent the relapse of depression. ?Description: Verbalize insight into how past relationships may be influencing current experiences with depression. ?Target Date: 2022-07-06 ?Progress: 30% ? ?Related Problem: Develop healthy thinking patterns and beliefs about self, others, and the world that lead to the alleviation and help prevent the relapse of depression. ?Description: Cooperate with a medication evaluation by a physician. ?Target Date: 2022-07-06 ?Progress: 100% ? ?Related Problem: Develop healthy thinking patterns and beliefs about self, others, and the world that lead to the alleviation and help prevent the relapse of depression. ?Description: Verbalize an understanding and resolution of current interpersonal problems. ?Target Date: 2022-07-06 ?Progress: 40% ? ?Related Problem: Develop healthy thinking patterns and beliefs about self, others, and the world that lead to the alleviation and help prevent the relapse of depression. ?Description: Learn and implement behavioral strategies to overcome depression. ?Target Date: 2022-07-06 ?Progress: 40% ? ?Related Problem: Develop healthy thinking patterns and beliefs about self, others, and the world that lead to the alleviation and help prevent the relapse of depression. ?Description: Identify and replace thoughts and beliefs that support depression. ?Target Date: 2022-07-06 ?Progress: 40% ? ?Related Problem: Develop healthy thinking patterns and beliefs  about self,  others, and the world that lead to the alleviation and help prevent the relapse of depression. ?Description: Learn and implement conflict resolution skills to resolve interpersonal problems. ?Target Date: 2022-07-06 ?Progress: 40% ? ?Related Problem: Reach a balance between work/competitive and social/noncompetitive time in daily life. ?Description: Increase daily time involved in relaxing activities. ?Target Date: 2022-07-06 ?Progress: 20% ? ?Related Problem: Reach a balance between work/competitive and social/noncompetitive time in daily life. ?Description: Decrease the number of hours worked daily and the frequency of taking work home. ?Target Date: 2022-07-06 ?Progress: 50% ? ?Related Problem: Reduce overall frequency, intensity, and duration of the anxiety so that daily functioning is not impaired. ?Description: Identify, challenge, and replace biased, fearful self-talk with positive, realistic, and empowering self-talk. ?Target Date: 2022-07-06 ?Progress: 30% ? ?Related Problem: Reduce overall frequency, intensity, and duration of the anxiety so that daily functioning is not impaired. ?Description: Describe situations, thoughts, feelings, and actions associated with anxieties and worries, their impact on functioning, and attempts to resolve them. ?Target Date: 2022-07-06 ?Progress: 40% ? ?Related Problem: Reduce overall frequency, intensity, and duration of the anxiety so that daily functioning is not impaired. ?Description: Learn and implement problem-solving strategies for realistically addressing worries. ?Target Date: 2022-07-06 ?Progress: 40% ? ?Related Problem: Reduce overall frequency, intensity, and duration of the anxiety so that daily functioning is not impaired. ?Description: Learn and implement personal and interpersonal skills to reduce anxiety and improve interpersonal relationships. ?Target Date: 2022-07-06 ?Progress: 30% ? ? ?Related Problem: Reduce overall frequency, intensity, and  duration of the anxiety so that daily functioning is not impaired. ?Description: Learn and implement relapse prevention strategies for managing possible future anxiety symptoms. ?Target Date: 2022-07-06 ?Progress: 50% ? ?Related Problem: Reduce overall frequency, intensity, and duration of the anxiety so that daily functioning is not impaired. ?Description: Identify and engage in pleasant activities on a daily basis. ?Target Date: 2022-07-06 ?Progress: 50% ? ?[name], LCSW ?

## 2022-04-08 DIAGNOSIS — M9906 Segmental and somatic dysfunction of lower extremity: Secondary | ICD-10-CM | POA: Diagnosis not present

## 2022-04-08 DIAGNOSIS — M9902 Segmental and somatic dysfunction of thoracic region: Secondary | ICD-10-CM | POA: Diagnosis not present

## 2022-04-08 DIAGNOSIS — M9901 Segmental and somatic dysfunction of cervical region: Secondary | ICD-10-CM | POA: Diagnosis not present

## 2022-04-08 DIAGNOSIS — M9903 Segmental and somatic dysfunction of lumbar region: Secondary | ICD-10-CM | POA: Diagnosis not present

## 2022-04-14 ENCOUNTER — Ambulatory Visit (INDEPENDENT_AMBULATORY_CARE_PROVIDER_SITE_OTHER): Payer: BC Managed Care – PPO | Admitting: Psychology

## 2022-04-14 DIAGNOSIS — M9903 Segmental and somatic dysfunction of lumbar region: Secondary | ICD-10-CM | POA: Diagnosis not present

## 2022-04-14 DIAGNOSIS — M9902 Segmental and somatic dysfunction of thoracic region: Secondary | ICD-10-CM | POA: Diagnosis not present

## 2022-04-14 DIAGNOSIS — F411 Generalized anxiety disorder: Secondary | ICD-10-CM | POA: Diagnosis not present

## 2022-04-14 DIAGNOSIS — M9901 Segmental and somatic dysfunction of cervical region: Secondary | ICD-10-CM | POA: Diagnosis not present

## 2022-04-14 DIAGNOSIS — F3342 Major depressive disorder, recurrent, in full remission: Secondary | ICD-10-CM | POA: Diagnosis not present

## 2022-04-14 DIAGNOSIS — M9906 Segmental and somatic dysfunction of lower extremity: Secondary | ICD-10-CM | POA: Diagnosis not present

## 2022-04-14 NOTE — Progress Notes (Signed)
Brandon Jackson Progress Note  Patient ID: Brandon Jackson, MRN: [id number]    Date: 04/14/22  Time Spent: 10:03 am - 10:52 am : 49 Minutes  Treatment Type: Individual Therapy.  Reported Symptoms: Depression and anxiety.  Mental Status Exam: Appearance:  Neat and Well Groomed     Behavior: Appropriate  Motor: Normal  Speech/Language:  Clear and Coherent and Normal Rate  Affect: Congruent  Mood: dysthymic  Thought process: normal  Thought content:   WNL  Sensory/Perceptual disturbances:   WNL  Orientation: oriented to person, place, time/date, and situation  Attention: Good  Concentration: Good  Memory: WNL  Fund of knowledge:  Good  Insight:   Good  Judgment:  Good  Impulse Control: Good   Risk Assessment: Danger to Self:  No Self-injurious Behavior: No Danger to Others: No Duty to Warn:no Physical Aggression / Violence:No  Access to Firearms a concern: No  Gang Involvement:No   Subjective:   Brandon Jackson participated from home, via video, and consented to treatment. Therapist participated from home office. We met online due to Brandon Jackson pandemic. Brandon Jackson reviewed the events of the past week. Brandon Jackson noted being anxious regarding commencement but that this decreased afterwards. He noted some progress with his friend in regards to the relationship. Therapist praised Brandon Jackson for his efforts to communicate his needs and feelings. Brandon Jackson noted his mother expressing  sadness regarding his breakup with Brandon Jackson. He noted continued guilt regarding Brandon Jackson. We continued to explore his feelings regarding the breakup. Therapist encouraged Brandon Jackson to manage rumination and to challenge negative thoughts. We worked on identifying underlying feelings and therapist encouraged Brandon Jackson to manage those as they arise in a healthy and consistent manner. Therapist normalized and validated Brandon Jackson's feelings and experiencing. Brandon Jackson is more active and noted enjoying  the weather. Brandon Jackson noted some improvement in the dynamics at work with his Brandon Jackson. He noted continual efforts to maintain work and home boundaries. A follow-up was scheduled.  Brandon Jackson continues to benefit from individual therapy. Brandon Jackson any SI or any recent medication changes. Brandon Jackson noted his sleep aid being ineffectual and will reach out to his psychiatric provider.   Interventions: Interpersonal  Diagnosis:  MDD (major depressive disorder), recurrent, in full remission ([place])  GAD (generalized anxiety disorder)   Plan: Patient is to use CBT, BA, ACT, mindfulness, and coping skills to help manage decrease symptoms associated with their diagnosis.  Related Problem: Develop healthy thinking patterns and beliefs about self, others, and the world that lead to the alleviation and help prevent the relapse of depression. Description: Verbalize insight into how past relationships may be influencing current experiences with depression. Target Date: 2022-07-06 Progress: 30%  Related Problem: Develop healthy thinking patterns and beliefs about self, others, and the world that lead to the alleviation and help prevent the relapse of depression. Description: Cooperate with a medication evaluation by a physician. Target Date: 2022-07-06 Progress: 100%  Related Problem: Develop healthy thinking patterns and beliefs about self, others, and the world that lead to the alleviation and help prevent the relapse of depression. Description: Verbalize an understanding and resolution of current interpersonal problems. Target Date: 2022-07-06 Progress: 40%  Related Problem: Develop healthy thinking patterns and beliefs about self, others, and the world that lead to the alleviation and help prevent the relapse of depression. Description: Learn and implement behavioral strategies to overcome depression. Target Date: 2022-07-06 Progress: 40%  Related Problem: Develop healthy thinking patterns and  beliefs about self, others, and the world that  lead to the alleviation and help prevent the relapse of depression. Description: Identify and replace thoughts and beliefs that support depression. Target Date: 2022-07-06 Progress: 40%  Related Problem: Develop healthy thinking patterns and beliefs about self, others, and the world that lead to the alleviation and help prevent the relapse of depression. Description: Learn and implement conflict resolution skills to resolve interpersonal problems. Target Date: 2022-07-06 Progress: 40%  Related Problem: Reach a balance between work/competitive and social/noncompetitive time in daily life. Description: Increase daily time involved in relaxing activities. Target Date: 2022-07-06 Progress: 20%  Related Problem: Reach a balance between work/competitive and social/noncompetitive time in daily life. Description: Decrease the number of hours worked daily and the frequency of taking work home. Target Date: 2022-07-06 Progress: 50%  Related Problem: Reduce overall frequency, intensity, and duration of the anxiety so that daily functioning is not impaired. Description: Identify, challenge, and replace biased, fearful self-talk with positive, realistic, and empowering self-talk. Target Date: 2022-07-06 Progress: 30%  Related Problem: Reduce overall frequency, intensity, and duration of the anxiety so that daily functioning is not impaired. Description: Describe situations, thoughts, feelings, and actions associated with anxieties and worries, their impact on functioning, and attempts to resolve them. Target Date: 2022-07-06 Progress: 40%  Related Problem: Reduce overall frequency, intensity, and duration of the anxiety so that daily functioning is not impaired. Description: Learn and implement problem-solving strategies for realistically addressing worries. Target Date: 2022-07-06 Progress: 40%  Related Problem: Reduce overall frequency,  intensity, and duration of the anxiety so that daily functioning is not impaired. Description: Learn and implement personal and interpersonal skills to reduce anxiety and improve interpersonal relationships. Target Date: 2022-07-06 Progress: 30%   Related Problem: Reduce overall frequency, intensity, and duration of the anxiety so that daily functioning is not impaired. Description: Learn and implement relapse prevention strategies for managing possible future anxiety symptoms. Target Date: 2022-07-06 Progress: 50%  Related Problem: Reduce overall frequency, intensity, and duration of the anxiety so that daily functioning is not impaired. Description: Identify and engage in pleasant activities on a daily basis. Target Date: 2022-07-06 Progress: 50%  [name], LCSW

## 2022-04-21 ENCOUNTER — Other Ambulatory Visit: Payer: Self-pay | Admitting: Podiatry

## 2022-04-21 NOTE — Telephone Encounter (Signed)
Please advise 

## 2022-05-12 DIAGNOSIS — M9903 Segmental and somatic dysfunction of lumbar region: Secondary | ICD-10-CM | POA: Diagnosis not present

## 2022-05-12 DIAGNOSIS — M9901 Segmental and somatic dysfunction of cervical region: Secondary | ICD-10-CM | POA: Diagnosis not present

## 2022-05-12 DIAGNOSIS — M9906 Segmental and somatic dysfunction of lower extremity: Secondary | ICD-10-CM | POA: Diagnosis not present

## 2022-05-12 DIAGNOSIS — M9902 Segmental and somatic dysfunction of thoracic region: Secondary | ICD-10-CM | POA: Diagnosis not present

## 2022-05-17 ENCOUNTER — Ambulatory Visit: Payer: BC Managed Care – PPO | Admitting: Family Medicine

## 2022-05-17 ENCOUNTER — Encounter: Payer: Self-pay | Admitting: Family Medicine

## 2022-05-17 VITALS — BP 122/80 | HR 71 | Temp 97.1°F | Ht 67.0 in | Wt 202.0 lb

## 2022-05-17 DIAGNOSIS — F411 Generalized anxiety disorder: Secondary | ICD-10-CM

## 2022-05-17 DIAGNOSIS — F339 Major depressive disorder, recurrent, unspecified: Secondary | ICD-10-CM | POA: Diagnosis not present

## 2022-05-17 DIAGNOSIS — R6889 Other general symptoms and signs: Secondary | ICD-10-CM

## 2022-05-17 DIAGNOSIS — M109 Gout, unspecified: Secondary | ICD-10-CM

## 2022-05-17 DIAGNOSIS — Z79899 Other long term (current) drug therapy: Secondary | ICD-10-CM

## 2022-05-17 LAB — CBC WITH DIFFERENTIAL/PLATELET
Basophils Absolute: 0.1 10*3/uL (ref 0.0–0.1)
Basophils Relative: 0.8 % (ref 0.0–3.0)
Eosinophils Absolute: 0.2 10*3/uL (ref 0.0–0.7)
Eosinophils Relative: 2.7 % (ref 0.0–5.0)
HCT: 45.8 % (ref 39.0–52.0)
Hemoglobin: 15.2 g/dL (ref 13.0–17.0)
Lymphocytes Relative: 34.9 % (ref 12.0–46.0)
Lymphs Abs: 2.3 10*3/uL (ref 0.7–4.0)
MCHC: 33.3 g/dL (ref 30.0–36.0)
MCV: 95.5 fl (ref 78.0–100.0)
Monocytes Absolute: 0.8 10*3/uL (ref 0.1–1.0)
Monocytes Relative: 11.6 % (ref 3.0–12.0)
Neutro Abs: 3.3 10*3/uL (ref 1.4–7.7)
Neutrophils Relative %: 50 % (ref 43.0–77.0)
Platelets: 202 10*3/uL (ref 150.0–400.0)
RBC: 4.79 Mil/uL (ref 4.22–5.81)
RDW: 13.2 % (ref 11.5–15.5)
WBC: 6.6 10*3/uL (ref 4.0–10.5)

## 2022-05-17 LAB — COMPREHENSIVE METABOLIC PANEL
ALT: 37 U/L (ref 0–53)
AST: 29 U/L (ref 0–37)
Albumin: 4.8 g/dL (ref 3.5–5.2)
Alkaline Phosphatase: 64 U/L (ref 39–117)
BUN: 18 mg/dL (ref 6–23)
CO2: 28 mEq/L (ref 19–32)
Calcium: 10.1 mg/dL (ref 8.4–10.5)
Chloride: 102 mEq/L (ref 96–112)
Creatinine, Ser: 1.02 mg/dL (ref 0.40–1.50)
GFR: 90.74 mL/min (ref >60.00)
Glucose, Bld: 94 mg/dL (ref 70–99)
Potassium: 4.4 mEq/L (ref 3.5–5.1)
Sodium: 139 mEq/L (ref 135–145)
Total Bilirubin: 0.7 mg/dL (ref 0.2–1.2)
Total Protein: 7.2 g/dL (ref 6.0–8.3)

## 2022-05-17 LAB — TSH: TSH: 1.24 u[IU]/mL (ref 0.35–5.50)

## 2022-05-17 LAB — LIPID PANEL
Cholesterol: 154 mg/dL (ref 0–200)
HDL: 49.6 mg/dL (ref >39.00)
LDL Cholesterol: 82 mg/dL (ref 0–99)
NonHDL: 104.23
Total CHOL/HDL Ratio: 3
Triglycerides: 111 mg/dL (ref 0.0–149.0)
VLDL: 22.2 mg/dL (ref 0.0–40.0)

## 2022-05-17 LAB — URIC ACID: Uric Acid, Serum: 6.4 mg/dL (ref 4.0–7.8)

## 2022-05-17 MED ORDER — CLONAZEPAM 0.5 MG PO TABS
ORAL_TABLET | ORAL | 1 refills | Status: DC
Start: 2022-05-17 — End: 2022-11-24

## 2022-05-17 MED ORDER — BUSPIRONE HCL 10 MG PO TABS
10.0000 mg | ORAL_TABLET | Freq: Two times a day (BID) | ORAL | 3 refills | Status: DC
Start: 2022-05-17 — End: 2022-08-09

## 2022-05-17 MED ORDER — ALLOPURINOL 200 MG PO TABS: 200.0000 mg | ORAL_TABLET | Freq: Every day | ORAL | 3 refills | Status: DC

## 2022-05-17 NOTE — Patient Instructions (Signed)
Go to the lab on the way out.   If you have mychart we'll likely use that to update you.    °Take care.  Glad to see you. °Update me as needed.   °

## 2022-05-17 NOTE — Progress Notes (Unsigned)
Weight is down.  D/w pt.  Dec in appetite noted with mood changes.  He is still active.  No FCNAVD.  He is divorced in the meantime.  He moved out in April.  Now living with [facility].  Work is better in the meantime.    Taking 10mg  buspar BID and that helped.    Rare use of lunesta.  He is going to f/u with psych about use.  No SI/HI.    D/w pt about gout.  No recent flares.  Occ twinge noted and usually resolves w/o taking colchicine.  Taking allopurinol 200mg  a day.  He can tell a change with lowering his dose.  D/w pt about continuing as is.    No ADE on PREP.  It is a relief for patient to be on it, d/w pt.    Meds, vitals, and allergies reviewed.   ROS: Per HPI unless specifically indicated in ROS section

## 2022-05-19 ENCOUNTER — Encounter: Payer: Self-pay | Admitting: Family Medicine

## 2022-05-19 DIAGNOSIS — R6889 Other general symptoms and signs: Secondary | ICD-10-CM | POA: Insufficient documentation

## 2022-05-19 DIAGNOSIS — Z79899 Other long term (current) drug therapy: Secondary | ICD-10-CM | POA: Insufficient documentation

## 2022-05-19 DIAGNOSIS — M109 Gout, unspecified: Secondary | ICD-10-CM | POA: Insufficient documentation

## 2022-05-19 NOTE — Assessment & Plan Note (Signed)
See above.  Continue prep.

## 2022-05-19 NOTE — Assessment & Plan Note (Signed)
See notes on labs.  Continue allopurinol.  Continue.  Colchicine.

## 2022-05-19 NOTE — Assessment & Plan Note (Signed)
Reasonable to check routine labs but I do not suspect an ominous process.  Still okay for outpatient follow-up.  He agrees to plan.

## 2022-05-19 NOTE — Assessment & Plan Note (Signed)
He is adjusting to dating, new living situation.  Overall his situation is improved but after sig upheaval.  Work has improved.  Affect improved today.  He'll continue f/u with psych and counseling.  Okay for outpatient f/u.  No SI/HI.

## 2022-05-26 ENCOUNTER — Ambulatory Visit (INDEPENDENT_AMBULATORY_CARE_PROVIDER_SITE_OTHER): Payer: BC Managed Care – PPO | Admitting: Psychology

## 2022-05-26 DIAGNOSIS — F411 Generalized anxiety disorder: Secondary | ICD-10-CM | POA: Diagnosis not present

## 2022-05-26 DIAGNOSIS — F3342 Major depressive disorder, recurrent, in full remission: Secondary | ICD-10-CM | POA: Diagnosis not present

## 2022-05-26 NOTE — Progress Notes (Signed)
[facility] Counselor/Therapist Progress Note  Patient ID: Brandon Jackson, MRN: [id number]    Date: 05/26/22  Time Spent: 10:03 am - 10:57 am : 54 Minutes  Treatment Type: Individual Therapy.  Reported Symptoms: Depression and anxiety.  Mental Status Exam: Appearance:  Neat and Well Groomed     Behavior: Appropriate  Motor: Normal  Speech/Language:  Clear and Coherent and Normal Rate  Affect: Congruent  Mood: dysthymic  Thought process: normal  Thought content:   WNL  Sensory/Perceptual disturbances:   WNL  Orientation: oriented to person, place, time/date, and situation  Attention: Good  Concentration: Good  Memory: WNL  Fund of knowledge:  Good  Insight:   Good  Judgment:  Good  Impulse Control: Good   Risk Assessment: Danger to Self:  No Self-injurious Behavior: No Danger to Others: No Duty to Warn:no Physical Aggression / Violence:No  Access to Firearms a concern: No  Gang Involvement:No   Subjective:   Brandon Jackson participated from home, via video, and consented to treatment. Therapist participated from home office. We met online due to [place] pandemic. Brandon Jackson reviewed the events of the past week. He noted the sudden illness of his dog, Squirt, who has to be euthanized as a result.  He noted some interpersonal stressors with his friend. Brandon Jackson noted utilizing journaling as a tool to process his feelings. He noted working through his stressors and journaling being effective. He noted reconnecting with his parents and is slated to spend a weekend with them, at the beach. 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 We processed this during the session and ways to maintain his balance day-to-day. He continues to struggle with his sleep and will be speaking with his psychiatric provider to work on further addressing his sleep. He noted reconnecting with his parents and expressed gratitude for this reconnection. A  follow-up was scheduled.  Brandon Jackson continues to benefit from individual therapy. Brandon Jackson any SI or any recent medication changes. Brandon Jackson noted his sleep aid being ineffectual and will reach out to his psychiatric provider. Brandon Jackson denied any safety concerns, has a safety plan in place, and takes his medication consistently and without concern. Therapist praised Brandon Jackson for his effort and energy and provided supportive therapy.   Interventions: Interpersonal  Diagnosis:  MDD (major depressive disorder), recurrent, in full remission ([place])  GAD (generalized anxiety disorder)   Plan: Patient is to use CBT, BA, ACT, mindfulness, and coping skills to help manage decrease symptoms associated with their diagnosis.  Related Problem: Develop healthy thinking patterns and beliefs about self, others, and the world that lead to the alleviation and help prevent the relapse of depression. Description: Verbalize insight into how past relationships may be influencing current experiences with depression. Target Date: 2022-07-06 Progress: 30%  Related Problem: Develop healthy thinking patterns and beliefs about self, others, and the world that lead to the alleviation and help prevent the relapse of depression. Description: Cooperate with a medication evaluation by a physician. Target Date: 2022-07-06 Progress: 100%  Related Problem: Develop healthy thinking patterns and beliefs about self, others, and the world that lead to the alleviation and help prevent the relapse of depression. Description: Verbalize an understanding and resolution of current interpersonal problems. Target Date: 2022-07-06 Progress: 40%  Related Problem: Develop healthy thinking patterns and beliefs about self, others, and the world that lead to the alleviation and help prevent the relapse of depression. Description: Learn and implement behavioral strategies to overcome depression.  Target Date: 2022-07-06 Progress:  40%  Related Problem: Develop healthy thinking patterns and beliefs about self, others, and the world that lead to the alleviation and help prevent the relapse of depression. Description: Identify and replace thoughts and beliefs that support depression. Target Date: 2022-07-06 Progress: 40%  Related Problem: Develop healthy thinking patterns and beliefs about self, others, and the world that lead to the alleviation and help prevent the relapse of depression. Description: Learn and implement conflict resolution skills to resolve interpersonal problems. Target Date: 2022-07-06 Progress: 40%  Related Problem: Reach a balance between work/competitive and social/noncompetitive time in daily life. Description: Increase daily time involved in relaxing activities. Target Date: 2022-07-06 Progress: 20%  Related Problem: Reach a balance between work/competitive and social/noncompetitive time in daily life. Description: Decrease the number of hours worked daily and the frequency of taking work home. Target Date: 2022-07-06 Progress: 50%  Related Problem: Reduce overall frequency, intensity, and duration of the anxiety so that daily functioning is not impaired. Description: Identify, challenge, and replace biased, fearful self-talk with positive, realistic, and empowering self-talk. Target Date: 2022-07-06 Progress: 30%  Related Problem: Reduce overall frequency, intensity, and duration of the anxiety so that daily functioning is not impaired. Description: Describe situations, thoughts, feelings, and actions associated with anxieties and worries, their impact on functioning, and attempts to resolve them. Target Date: 2022-07-06 Progress: 40%  Related Problem: Reduce overall frequency, intensity, and duration of the anxiety so that daily functioning is not impaired. Description: Learn and implement problem-solving strategies for realistically addressing worries. Target Date:  2022-07-06 Progress: 40%  Related Problem: Reduce overall frequency, intensity, and duration of the anxiety so that daily functioning is not impaired. Description: Learn and implement personal and interpersonal skills to reduce anxiety and improve interpersonal relationships. Target Date: 2022-07-06 Progress: 30%   Related Problem: Reduce overall frequency, intensity, and duration of the anxiety so that daily functioning is not impaired. Description: Learn and implement relapse prevention strategies for managing possible future anxiety symptoms. Target Date: 2022-07-06 Progress: 50%  Related Problem: Reduce overall frequency, intensity, and duration of the anxiety so that daily functioning is not impaired. Description: Identify and engage in pleasant activities on a daily basis. Target Date: 2022-07-06 Progress: 50%  [name], LCSW

## 2022-05-31 ENCOUNTER — Telehealth (INDEPENDENT_AMBULATORY_CARE_PROVIDER_SITE_OTHER): Payer: BC Managed Care – PPO | Admitting: Psychiatry

## 2022-05-31 ENCOUNTER — Encounter: Payer: Self-pay | Admitting: Psychiatry

## 2022-05-31 DIAGNOSIS — F99 Mental disorder, not otherwise specified: Secondary | ICD-10-CM

## 2022-05-31 DIAGNOSIS — F411 Generalized anxiety disorder: Secondary | ICD-10-CM | POA: Diagnosis not present

## 2022-05-31 DIAGNOSIS — F5105 Insomnia due to other mental disorder: Secondary | ICD-10-CM

## 2022-05-31 DIAGNOSIS — F3342 Major depressive disorder, recurrent, in full remission: Secondary | ICD-10-CM

## 2022-05-31 MED ORDER — ESZOPICLONE 2 MG PO TABS: 1.0000 mg | ORAL_TABLET | Freq: Every evening | ORAL | 2 refills | Status: DC | PRN

## 2022-05-31 MED ORDER — HYDROXYZINE PAMOATE 25 MG PO CAPS: 25.0000 mg | ORAL_CAPSULE | Freq: Every evening | ORAL | 1 refills | Status: DC | PRN

## 2022-05-31 NOTE — Patient Instructions (Signed)
Hydroxyzine Capsules or Tablets What is this medication? HYDROXYZINE (hye DROX i zeen) treats the symptoms of allergies and allergic reactions. It may also be used to treat anxiety or cause drowsiness before a procedure. It works by blocking histamine, a substance released by the body during an allergic reaction. It belongs to a group of medications called antihistamines. This medicine may be used for other purposes; ask your health care provider or pharmacist if you have questions. COMMON BRAND NAME(S): ANX, Atarax, Rezine, Vistaril What should I tell my care team before I take this medication? They need to know if you have any of these conditions: Glaucoma Heart disease History of irregular heartbeat Kidney disease Liver disease Lung or breathing disease, like asthma Stomach or intestine problems Thyroid disease Trouble passing urine An unusual or allergic reaction to hydroxyzine, cetirizine, other medications, foods, dyes or preservatives Pregnant or trying to get pregnant Breast-feeding How should I use this medication? Take this medication by mouth with a full glass of water. Follow the directions on the prescription label. You may take this medication with food or on an empty stomach. Take your medication at regular intervals. Do not take your medication more often than directed. Talk to your care team regarding the use of this medication in children. Special care may be needed. While this medication may be prescribed for children as young as 6 years of age for selected conditions, precautions do apply. Patients over 65 years old may have a stronger reaction and need a smaller dose. Overdosage: If you think you have taken too much of this medicine contact a poison control center or emergency room at once. NOTE: This medicine is only for you. Do not share this medicine with others. What if I miss a dose? If you miss a dose, take it as soon as you can. If it is almost time for your next  dose, take only that dose. Do not take double or extra doses. What may interact with this medication? Do not take this medication with any of the following: Cisapride Dronedarone Pimozide Thioridazine This medication may also interact with the following: Alcohol Antihistamines for allergy, cough, and cold Atropine Barbiturate medications for sleep or seizures, like phenobarbital Certain antibiotics like erythromycin or clarithromycin Certain medications for anxiety or sleep Certain medications for bladder problems like oxybutynin, tolterodine Certain medications for depression or psychotic disturbances Certain medications for irregular heart beat Certain medications for Parkinson's disease like benztropine, trihexyphenidyl Certain medications for seizures like phenobarbital, primidone Certain medications for stomach problems like dicyclomine, hyoscyamine Certain medications for travel sickness like scopolamine Ipratropium Narcotic medications for pain Other medications that prolong the QT interval (which can cause an abnormal heart rhythm) like dofetilide This list may not describe all possible interactions. Give your health care provider a list of all the medicines, herbs, non-prescription drugs, or dietary supplements you use. Also tell them if you smoke, drink alcohol, or use illegal drugs. Some items may interact with your medicine. What should I watch for while using this medication? Tell your care team if your symptoms do not improve. You may get drowsy or dizzy. Do not drive, use machinery, or do anything that needs mental alertness until you know how this medication affects you. Do not stand or sit up quickly, especially if you are an older patient. This reduces the risk of dizzy or fainting spells. Alcohol may interfere with the effect of this medication. Avoid alcoholic drinks. Your mouth may get dry. Chewing sugarless gum or sucking hard   candy, and drinking plenty of water may  help. Contact your care team if the problem does not go away or is severe. This medication may cause dry eyes and blurred vision. If you wear contact lenses you may feel some discomfort. Lubricating drops may help. See your eye care specialist if the problem does not go away or is severe. If you are receiving skin tests for allergies, tell your care team you are using this medication. What side effects may I notice from receiving this medication? Side effects that you should report to your care team as soon as possible: Allergic reactions--skin rash, itching, hives, swelling of the face, lips, tongue, or throat Heart rhythm changes--fast or irregular heartbeat, dizziness, feeling faint or lightheaded, chest pain, trouble breathing Side effects that usually do not require medical attention (report to your care team if they continue or are bothersome): Confusion Drowsiness Dry mouth Hallucinations Headache This list may not describe all possible side effects. Call your doctor for medical advice about side effects. You may report side effects to FDA at 1-800-FDA-1088. Where should I keep my medication? Keep out of the reach of children and pets. Store at room temperature between 15 and 30 degrees C (59 and 86 degrees F). Keep container tightly closed. Throw away any unused medication after the expiration date. NOTE: This sheet is a summary. It may not cover all possible information. If you have questions about this medicine, talk to your doctor, pharmacist, or health care provider.  2023 Elsevier/Gold Standard (2021-01-07 00:00:00)  

## 2022-05-31 NOTE — Progress Notes (Unsigned)
Virtual Visit via Video Note  I connected with Brandon Jackson on 05/31/22 at  2:00 PM EDT by a video enabled telemedicine application and verified that I am speaking with the correct person using two identifiers.  Location Provider Location : [facility] Patient Location : Home  Participants: Patient , Provider    I discussed the limitations of evaluation and management by telemedicine and the availability of in person appointments. The patient expressed understanding and agreed to proceed.   I discussed the assessment and treatment plan with the patient. The patient was provided an opportunity to ask questions and all were answered. The patient agreed with the plan and demonstrated an understanding of the instructions.   The patient was advised to call back or seek an in-person evaluation if the symptoms worsen or if the condition fails to improve as anticipated.  BH MD OP Progress Note  06/01/2022 6:48 AM Brandon Jackson  MRN:  [id number]  Chief Complaint:  Chief Complaint  Patient presents with   Follow-up: 41 year old Caucasian male with history of MDD, GAD, insomnia, presented for medication management.   HPI: Brandon Jackson is a 41 year old Caucasian male, currently employed, lives in [place], has a history of MDD, GAD, hyperlipidemia, insomnia was evaluated by telemedicine today.  Patient today reports he is currently doing well with regards to his depression.  Denies any sadness, anhedonia or crying spells.  Was able to take a break from work, went to [employer].  That helped.  Continues to have occasional anxiety attacks, racing thoughts and sleep problems at night.  This may happen couple of times a month or so.  Reports usually triggered by negative thoughts, which kind of make him go into a panic mood and it  triggers anxiety throughout the night and causes sleep problems.  Lunesta 1 mg does not help those nights.  Has tried low dosage of Klonopin as well as  relaxation technique which may have helped.  Was able to discuss this with therapist also.  Reports therapy sessions as beneficial.  Denies any suicidality, homicidality or perceptual disturbances.  Reports work is currently busy, managing well.  Denies any side effects to medications.  Reports he is compliant.  Denies any other concerns today.  Visit Diagnosis:    ICD-10-CM   1. MDD (major depressive disorder), recurrent, in full remission (HCC)  F33.42     2. GAD (generalized anxiety disorder)  F41.1 hydrOXYzine (VISTARIL) 25 MG capsule    3. Insomnia due to other mental disorder  F51.05 eszopiclone (LUNESTA) 2 MG TABS tablet   F99 hydrOXYzine (VISTARIL) 25 MG capsule   Anxiety, depression      Past Psychiatric History: Reviewed past psychiatric history from progress note on 12/23/2021.  Past trials of Lexapro, Effexor, Wellbutrin, Lunesta, trazodone.  Past Medical History:  Past Medical History:  Diagnosis Date   Anxiety    Depression    Frequent headaches    Gout    History of chicken pox    Migraines    Ulcer     Past Surgical History:  Procedure Laterality Date   CYSTECTOMY  2003   TONSILLECTOMY AND ADENOIDECTOMY  2007   Wisdom Teeth Removal  1998    Family Psychiatric History: Reviewed family psychiatric history from progress note on 12/23/2021.  Family History:  Family History  Problem Relation Age of Onset   Depression Mother    Diabetes Father    Hyperlipidemia Father    Hypertension Father    Colon  cancer Neg Hx    Prostate cancer Neg Hx     Social History: Reviewed social history from progress note on 12/23/2021. Social History   Socioeconomic History   Marital status: Significant Other    Spouse name: Not on file   Number of children: Not on file   Years of education: College   Highest education level: Not on file  Occupational History   Occupation: communications    Employer: [employer]  Tobacco Use   Smoking status: Former     Types: Cigarettes    Quit date: 2013    Years since quitting: 10.5    Passive exposure: Past   Smokeless tobacco: Never  Vaping Use   Vaping Use: Never used  Substance and Sexual Activity   Alcohol use: Yes    Comment: occasional   Drug use: No   Sexual activity: Not on file  Other Topics Concern   Not on file  Social History Narrative   Single   Was in committed relationship 2007-2023.   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     Social Determinants of Health   Financial Resource Strain: Not on file  Food Insecurity: Not on file  Transportation Needs: Not on file  Physical Activity: Not on file  Stress: Not on file  Social Connections: Not on file    Allergies:  Allergies  Allergen Reactions   Wellbutrin [Bupropion] Other (See Comments)    Anxiety increased on med when taken alone.  He can tolerate wellbutrin when taken with lexparo.    Erythromycin Diarrhea and Rash    Metabolic Disorder Labs: No results found for: "HGBA1C", "MPG" No results found for: "PROLACTIN" Lab Results  Component Value Date   CHOL 154 05/17/2022   TRIG 111.0 05/17/2022   HDL 49.60 05/17/2022   CHOLHDL 3 05/17/2022   VLDL 22.2 05/17/2022   LDLCALC 82 05/17/2022   LDLCALC 82 10/12/2021   Lab Results  Component Value Date   TSH 1.24 05/17/2022   TSH 1.310 01/05/2022    Therapeutic Level Labs: No results found for: "LITHIUM" No results found for: "VALPROATE" No results found for: "CBMZ"  Current Medications: Current Outpatient Medications  Medication Sig Dispense Refill   allopurinol 200 MG TABS Take 200 mg by mouth daily. 90 tablet 3   buPROPion (WELLBUTRIN SR) 150 MG 12 hr tablet Take 1 tablet (150 mg total) by mouth 2 (two) times daily. 180 tablet 1   busPIRone (BUSPAR) 10 MG tablet Take 1 tablet (10 mg total) by mouth 2 (two) times daily. 180 tablet 3   cetirizine (ZYRTEC) 10 MG tablet Take 1 tablet (10 mg total) by mouth daily. 30  tablet 0   clonazePAM (KLONOPIN) 0.5 MG tablet 0.5--1 TAB TWICE A DAY IF NEEDED FOR ANXIETY CAN TAKE 0.5-1 TAB AT NIGHT FOR INSOMNIA. SEDATION CAUTION 20 tablet 1   colchicine 0.6 MG tablet 2 po at onset of pain, may repeat 1 tab in 1 hour if still hurting- no more then 3 tablets in 24 hours 12 tablet 1   emtricitabine-tenofovir (TRUVADA) 200-300 MG tablet Take 1 tablet by mouth daily. 90 tablet 3   eszopiclone (LUNESTA) 2 MG TABS tablet Take 0.5-1 tablets (1-2 mg total) by mouth at bedtime as needed for sleep. Take immediately before bedtime 30 tablet 2   hydrOXYzine (VISTARIL) 25 MG capsule Take 1-3 capsules (25-75 mg total) by mouth at bedtime as needed for anxiety. And  sleep 90 capsule 1   Multiple Vitamin (MULTIVITAMIN) tablet Take 1 tablet by mouth daily.     valACYclovir (VALTREX) 1000 MG tablet Take 1,000 mg by mouth 2 (two) times daily.     No current facility-administered medications for this visit.     Musculoskeletal: Strength & Muscle Tone:  UTA Gait & Station:  Seated Patient leans: N/A  Psychiatric Specialty Exam: Review of Systems  Psychiatric/Behavioral:  Positive for sleep disturbance. The patient is nervous/anxious.   All other systems reviewed and are negative.   There were no vitals taken for this visit.There is no height or weight on file to calculate BMI.  General Appearance: Casual  Eye Contact:  Fair  Speech:  Clear and Coherent  Volume:  Normal  Mood:  Anxious  Affect:  Full Range  Thought Process:  Goal Directed and Descriptions of Associations: Intact  Orientation:  Full (Time, Place, and Person)  Thought Content: Logical   Suicidal Thoughts:  No  Homicidal Thoughts:  No  Memory:  Immediate;   Fair Recent;   Fair Remote;   Fair  Judgement:  Fair  Insight:  Fair  Psychomotor Activity:  Normal  Concentration:  Concentration: Fair and Attention Span: Fair  Recall:  [employer] of Knowledge: Fair  Language: Fair  Akathisia:  No  Handed:  Right   AIMS (if indicated):not  done  Assets:  Communication Skills Desire for Improvement Housing Social Support  ADL's:  Intact  Cognition: WNL  Sleep:   restless at times   Screenings: AIMS    Flowsheet Row Video Visit from 03/03/2022 in [facility]  AIMS Total Score 0      GAD-7    Flowsheet Row Video Visit from 05/31/2022 in [facility] Video Visit from 01/13/2022 in [facility] Office Visit from 12/23/2021 in [facility] Office Visit from 09/01/2021 in [facility] [place] Office Visit from 08/06/2021 in [facility] [place]  Total GAD-7 Score 5 7 14 17 19       PHQ2-9    Flowsheet Row Video Visit from 05/31/2022 in [facility] Video Visit from 03/03/2022 in [facility] Video Visit from 01/13/2022 in [facility] Office Visit from 12/23/2021 in [facility] Office Visit from 09/01/2021 in [facility] [place]  PHQ-2 Total Score 0 1 1 1 4   PHQ-9 Total Score -- -- 4 7 19       Flowsheet Row Office Visit from 12/23/2021 in [facility] ED from 01/02/2021 in [facility]   C-SSRS RISK CATEGORY No Risk No Risk        Assessment and Plan: Brandon Jackson is a 41 year old Caucasian male, employed, lives in [place], has a history of MDD, GAD, presented for medication management.  Patient is currently struggling with anxiety and sleep problems at night, occasional, will benefit from the following plan.  Plan MDD in remission Wellbutrin SR 150 mg p.o. twice daily BuSpar 10 mg p.o. twice daily  GAD-improving BuSpar 10 mg p.o. twice daily Klonopin 0.5 mg as needed.  Reviewed [place] PMP aware. Continue CBT with Mr.Mostafa.  Insomnia-unstable Increase Lunesta to 1 to  2 mg p.o. nightly as needed Start hydroxyzine 25-75 mg p.o. nightly as needed when having sleep issues due to anxiety. Discussed relaxation techniques, following up with therapist.  Follow-up in clinic in 3 months or sooner if  needed.  This note was generated in part or whole with voice recognition software. Voice recognition is usually quite accurate but there are transcription errors that can and very often do occur. I apologize for any typographical errors that were not detected and corrected.    Collaboration of Care: Collaboration of Care: Referral or follow-up with counselor/therapist AEB encouraged to continue follow-up with therapist.  Patient/Guardian was advised Release of Information must be obtained prior to any record release in order to collaborate their care with an outside provider. Patient/Guardian was advised if they have not already done so to contact the registration department to sign all necessary forms in order for [place] to release information regarding their care.   Consent: Patient/Guardian gives verbal consent for treatment and assignment of benefits for services provided during this visit. Patient/Guardian expressed understanding and agreed to proceed.   This note was generated in part or whole with voice recognition software. Voice recognition is usually quite accurate but there are transcription errors that can and very often do occur. I apologize for any typographical errors that were not detected and corrected.      Jomarie Longs, MD 06/01/2022, 6:48 AM

## 2022-06-01 ENCOUNTER — Telehealth: Payer: BC Managed Care – PPO | Admitting: Psychiatry

## 2022-06-06 ENCOUNTER — Encounter: Payer: Self-pay | Admitting: Family Medicine

## 2022-06-08 ENCOUNTER — Encounter: Payer: Self-pay | Admitting: Physician Assistant

## 2022-06-08 ENCOUNTER — Ambulatory Visit (INDEPENDENT_AMBULATORY_CARE_PROVIDER_SITE_OTHER): Payer: Self-pay | Admitting: Physician Assistant

## 2022-06-08 VITALS — BP 128/86 | HR 83 | Temp 97.8°F | Wt 199.4 lb

## 2022-06-08 DIAGNOSIS — J302 Other seasonal allergic rhinitis: Secondary | ICD-10-CM

## 2022-06-08 NOTE — Progress Notes (Signed)
   Subjective:    Patient ID: Brandon Jackson, male    DOB: 1981/10/21, 41 y.o.   MRN: [id number]  HPI 41 yo M presents concerned about possible strep. Hx of repetitive episodes as an adult 2005-2007 warranted tonsillectomy 2007 He reports positive STREP  since that time on at least one occasion Felt fine yesterday- Today has right sided discomfort throat to his palpation, some sensation ear fullness, and mild nausea and bad breath- which he reports were findings during the last strep episode  Particularly concerned because traveling to family this weekend Requests testing  Has seasonal allergies and uses Zyrtec-  Has not used nasal spray Walking regularly in the evenings and may have noted increased sinus pressure Sleeps on right side Denies change in hearing  Review of Systems As noted above    Objective:   Physical Exam WDWN overweight WM in NAD , afebrile , VSS, denies headache Ears negatively bilaterally , canals and TMs no erythema, Right Ant cervical chain and submandibular areas are negative for mass, ,  Patient  reports tenderness with touch Percussion sinuses without dullness or complaint of discomfort Oral cavity no erythema, no obvious mass or enlarged areas , bilaterally Dentition in good repair, no infection/inflammation/decay Strep swab taken right peritonsillar area of reported tenderness No areas of obvious visual concern  FROM head and neck, rotation, flexion, extension      Assessment & Plan:  POC - Strep negative  [place] area is busy with garden updates, mulch and sprays- Peripheral agriculture contributes as well Encourage add fluticasone nasal spray during high allergy exposure Increase hydration, warm showers and steam for eustachian tube support Avoid forceful blowing Reassurance re current apparently negative strep status-  RTC with questions ,concerns , exacerbation  Seasonal allergies Strep history, anxiety

## 2022-06-09 ENCOUNTER — Other Ambulatory Visit: Payer: Self-pay | Admitting: Family Medicine

## 2022-06-09 MED ORDER — ALLOPURINOL 100 MG PO TABS
100.0000 mg | ORAL_TABLET | Freq: Two times a day (BID) | ORAL | 3 refills | Status: DC
Start: 2022-06-09 — End: 2022-11-11

## 2022-06-30 ENCOUNTER — Other Ambulatory Visit: Payer: Self-pay | Admitting: Psychiatry

## 2022-06-30 DIAGNOSIS — F5105 Insomnia due to other mental disorder: Secondary | ICD-10-CM

## 2022-06-30 DIAGNOSIS — F411 Generalized anxiety disorder: Secondary | ICD-10-CM

## 2022-07-01 ENCOUNTER — Encounter: Payer: Self-pay | Admitting: Nurse Practitioner

## 2022-07-01 ENCOUNTER — Ambulatory Visit (INDEPENDENT_AMBULATORY_CARE_PROVIDER_SITE_OTHER): Payer: Self-pay | Admitting: Nurse Practitioner

## 2022-07-01 VITALS — BP 122/84 | HR 67 | Temp 98.2°F

## 2022-07-01 DIAGNOSIS — J069 Acute upper respiratory infection, unspecified: Secondary | ICD-10-CM

## 2022-07-01 LAB — POC COVID19 BINAXNOW: SARS Coronavirus 2 Ag: NEGATIVE

## 2022-07-01 NOTE — Progress Notes (Signed)
[facility]ist[facility] [address] [place], [place] [address] [phone]  Office Visit Note  Patient Name: Brandon Jackson Date of Birth [id number]  Medical Record number [id number]  Date of Service: 07/01/2022  Chief Complaint  Patient presents with   Sinusitis    Started yesterday around 4. Sinuses, and ears, has now moved to his chest. Took Tylenol cold.      HPI 41 year old male presenting to [facility] with complaints of nasal congestion and a cough for the past 48 hours.  No known sick contacts.    Current Medication:  Outpatient Encounter Medications as of 07/01/2022  Medication Sig   allopurinol (ZYLOPRIM) 100 MG tablet Take 1 tablet (100 mg total) by mouth 2 (two) times daily.   buPROPion (WELLBUTRIN SR) 150 MG 12 hr tablet Take 1 tablet (150 mg total) by mouth 2 (two) times daily.   busPIRone (BUSPAR) 10 MG tablet Take 1 tablet (10 mg total) by mouth 2 (two) times daily.   cetirizine (ZYRTEC) 10 MG tablet Take 1 tablet (10 mg total) by mouth daily.   clonazePAM (KLONOPIN) 0.5 MG tablet 0.5--1 TAB TWICE A DAY IF NEEDED FOR ANXIETY CAN TAKE 0.5-1 TAB AT NIGHT FOR INSOMNIA. SEDATION CAUTION   emtricitabine-tenofovir (TRUVADA) 200-300 MG tablet Take 1 tablet by mouth daily.   eszopiclone (LUNESTA) 2 MG TABS tablet Take 0.5-1 tablets (1-2 mg total) by mouth at bedtime as needed for sleep. Take immediately before bedtime   hydrOXYzine (VISTARIL) 25 MG capsule TAKE 1-3 CAPSULES (25-75 MG TOTAL) AT BEDTIME AS NEEDED FOR ANXIETY AND SLEEP.   Multiple Vitamin (MULTIVITAMIN) tablet Take 1 tablet by mouth daily.   valACYclovir (VALTREX) 1000 MG tablet Take 1,000 mg by mouth 2 (two) times daily.   colchicine 0.6 MG tablet 2 po at onset of pain, may repeat 1 tab in 1 hour if still hurting- no more then 3 tablets in 24 hours (Patient not taking: Reported on 07/01/2022)   No facility-administered encounter medications on file as of 07/01/2022.      Medical  History: Past Medical History:  Diagnosis Date   Anxiety    Depression    Frequent headaches    Gout    History of chicken pox    Migraines    Ulcer      Vital Signs: BP 122/84 (BP Location: Left Arm, Patient Position: Sitting, Cuff Size: Normal)   Pulse 67   Temp 98.2 F (36.8 C) (Tympanic)   SpO2 97%    Review of Systems  Constitutional: Negative.   HENT:  Positive for congestion and postnasal drip.   Eyes: Negative.   Respiratory:  Positive for cough.   Genitourinary: Negative.   Musculoskeletal: Negative.   Neurological: Negative.     Physical Exam HENT:     Head: Normocephalic.     Right Ear: Tympanic membrane, ear canal and external ear normal.     Left Ear: Tympanic membrane, ear canal and external ear normal.     Nose: Congestion present.     Mouth/Throat:     Mouth: Mucous membranes are moist.  Eyes:     Pupils: Pupils are equal, round, and reactive to light.  Cardiovascular:     Rate and Rhythm: Normal rate and regular rhythm.     Heart sounds: Normal heart sounds.  Pulmonary:     Effort: Pulmonary effort is normal.     Breath sounds: Normal breath sounds.  Skin:    General: Skin  is warm.  Neurological:     Mental Status: He is alert.  Psychiatric:        Mood and Affect: Mood normal.       Assessment/Plan: 1. Viral upper respiratory tract infection  - POC COVID-19 negative   Advised management with OTC medications including Mucinex for cough relief, increased fluids, rest, and wearing a mask until symptoms resolve   RTC if symptoms persist or with new or worsening symptoms      General Counseling: Chun verbalizes understanding of the findings of todays visit and agrees with plan of treatment. I have discussed any further diagnostic evaluation that may be needed or ordered today. We also reviewed his medications today. he has been encouraged to call the office with any questions or concerns that should arise related to todays  visit.     Time spent:10 Minutes    Viviano Simas [facility] Nurse Practitioner

## 2022-07-06 ENCOUNTER — Ambulatory Visit: Payer: BC Managed Care – PPO | Admitting: Psychology

## 2022-07-06 DIAGNOSIS — F411 Generalized anxiety disorder: Secondary | ICD-10-CM

## 2022-07-06 DIAGNOSIS — F3342 Major depressive disorder, recurrent, in full remission: Secondary | ICD-10-CM | POA: Diagnosis not present

## 2022-07-06 NOTE — Progress Notes (Signed)
[facility] Counselor/Therapist Progress Note  Patient ID: Brandon Jackson, MRN: [id number]    Date: 07/06/22  Time Spent: 4:06 pm - 5:00 pm : 54 Minutes  Treatment Type: Individual Therapy.  Reported Symptoms: Depression and anxiety.  Mental Status Exam: Appearance:  Neat and Well Groomed     Behavior: Appropriate  Motor: Normal  Speech/Language:  Clear and Coherent and Normal Rate  Affect: Congruent  Mood: normal  Thought process: normal  Thought content:   WNL  Sensory/Perceptual disturbances:   WNL  Orientation: oriented to person, place, time/date, and situation  Attention: Good  Concentration: Good  Memory: WNL  Fund of knowledge:  Good  Insight:   Good  Judgment:  Good  Impulse Control: Good   Risk Assessment: Danger to Self:  No Self-injurious Behavior: No Danger to Others: No Duty to Warn:no Physical Aggression / Violence:No  Access to Firearms a concern: No  Gang Involvement:No   Subjective:   [name] participated from home, via video, and consented to treatment. Therapist participated from home office. We met online due to [place] pandemic. Izreal reviewed the events of the past week. Therron noted feeling better and working on "reining in general anxiety".  He noted being in a "better place than a year ago". We worked on identifying a possible source of his anxiety. He noted feeling over-planning and feeling anxious when things do go as planned. He noted he is catastrophizing more often. He endorsed rumination and noted his unsuccessful attempts to manage this. We reviewed ways to manage his negative self-talk via positive thinking, challenging negative self-talk, engaging in neutral talk, and create a more flexible plan and allow for changes in the plan.  He noted work going well than expected although he continues to catastrophized regarding job loss. Sharone noted a need to develop an identity at work and become more self-confidence  in self and across settings. Therapist encouraged Kodi to delineate the ingredients of  what it would be to be more confident and ways to recognize his own skills and strengths. He noted losing 45# since October and noted eating more healthfully and less emotionally. Therapist praised Lamond for his effort and energy and provided supportive therapy.   Interventions: Cognitive Behavioral Therapy  Diagnosis:  MDD (major depressive disorder), recurrent, in full remission ([place])  GAD (generalized anxiety disorder)   Plan: Patient is to use CBT, BA, ACT, mindfulness, and coping skills to help manage decrease symptoms associated with their diagnosis.  Related Problem: Develop healthy thinking patterns and beliefs about self, others, and the world that lead to the alleviation and help prevent the relapse of depression. Description: Verbalize insight into how past relationships may be influencing current experiences with depression. Target Date: 2022-08-06 Progress: 30%  Related Problem: Develop healthy thinking patterns and beliefs about self, others, and the world that lead to the alleviation and help prevent the relapse of depression. Description: Cooperate with a medication evaluation by a physician. Target Date: 2022-08-06 Progress: 100%  Related Problem: Develop healthy thinking patterns and beliefs about self, others, and the world that lead to the alleviation and help prevent the relapse of depression. Description: Verbalize an understanding and resolution of current interpersonal problems. Target Date: 2022-08-06 Progress: 40%  Related Problem: Develop healthy thinking patterns and beliefs about self, others, and the world that lead to the alleviation and help prevent the relapse of depression. Description: Learn and implement behavioral strategies to overcome depression. Target Date: 2022-07-06 Progress: 40%  Related Problem:  Develop healthy thinking patterns and beliefs about  self, others, and the world that lead to the alleviation and help prevent the relapse of depression. Description: Identify and replace thoughts and beliefs that support depression. Target Date: 2022-08-06 Progress: 40%  Related Problem: Develop healthy thinking patterns and beliefs about self, others, and the world that lead to the alleviation and help prevent the relapse of depression. Description: Learn and implement conflict resolution skills to resolve interpersonal problems. Target Date: 2022-08-06 Progress: 40%  Related Problem: Reach a balance between work/competitive and social/noncompetitive time in daily life. Description: Increase daily time involved in relaxing activities. Target Date: 2022-08-06 Progress: 20%  Related Problem: Reach a balance between work/competitive and social/noncompetitive time in daily life. Description: Decrease the number of hours worked daily and the frequency of taking work home. Target Date: 2022-08-06 Progress: 50%  Related Problem: Reduce overall frequency, intensity, and duration of the anxiety so that daily functioning is not impaired. Description: Identify, challenge, and replace biased, fearful self-talk with positive, realistic, and empowering self-talk. Target Date: 2022-08-06 Progress: 30%  Related Problem: Reduce overall frequency, intensity, and duration of the anxiety so that daily functioning is not impaired. Description: Describe situations, thoughts, feelings, and actions associated with anxieties and worries, their impact on functioning, and attempts to resolve them. Target Date: 2022-08-06 Progress: 40%  Related Problem: Reduce overall frequency, intensity, and duration of the anxiety so that daily functioning is not impaired. Description: Learn and implement problem-solving strategies for realistically addressing worries. Target Date: 2022-08-06 Progress: 40%  Related Problem: Reduce overall frequency, intensity, and duration  of the anxiety so that daily functioning is not impaired. Description: Learn and implement personal and interpersonal skills to reduce anxiety and improve interpersonal relationships. Target Date: 2022-08-06 Progress: 30%   Related Problem: Reduce overall frequency, intensity, and duration of the anxiety so that daily functioning is not impaired. Description: Learn and implement relapse prevention strategies for managing possible future anxiety symptoms. Target Date: 2022-08-06 Progress: 50%  Related Problem: Reduce overall frequency, intensity, and duration of the anxiety so that daily functioning is not impaired. Description: Identify and engage in pleasant activities on a daily basis. Target Date: 2022-08-06 Progress: 50%  [name], LCSW

## 2022-07-11 ENCOUNTER — Encounter: Payer: Self-pay | Admitting: Family Medicine

## 2022-07-13 MED ORDER — BUPROPION HCL ER (SR) 150 MG PO TB12: 150.0000 mg | ORAL_TABLET | Freq: Two times a day (BID) | ORAL | 1 refills | Status: DC

## 2022-07-13 MED ORDER — EMTRICITABINE-TENOFOVIR DF 200-300 MG PO TABS: 1.0000 | ORAL_TABLET | Freq: Every day | ORAL | 3 refills | Status: DC

## 2022-08-09 ENCOUNTER — Encounter: Payer: Self-pay | Admitting: Psychiatry

## 2022-08-09 ENCOUNTER — Telehealth (INDEPENDENT_AMBULATORY_CARE_PROVIDER_SITE_OTHER): Payer: BC Managed Care – PPO | Admitting: Psychiatry

## 2022-08-09 DIAGNOSIS — F99 Mental disorder, not otherwise specified: Secondary | ICD-10-CM | POA: Diagnosis not present

## 2022-08-09 DIAGNOSIS — F3342 Major depressive disorder, recurrent, in full remission: Secondary | ICD-10-CM

## 2022-08-09 DIAGNOSIS — F5105 Insomnia due to other mental disorder: Secondary | ICD-10-CM | POA: Diagnosis not present

## 2022-08-09 DIAGNOSIS — F330 Major depressive disorder, recurrent, mild: Secondary | ICD-10-CM | POA: Diagnosis not present

## 2022-08-09 DIAGNOSIS — F411 Generalized anxiety disorder: Secondary | ICD-10-CM | POA: Diagnosis not present

## 2022-08-09 MED ORDER — BUSPIRONE HCL 10 MG PO TABS
20.0000 mg | ORAL_TABLET | Freq: Two times a day (BID) | ORAL | 0 refills | Status: DC
Start: 2022-08-09 — End: 2022-09-23

## 2022-08-09 NOTE — Progress Notes (Signed)
Virtual Visit via Video Note  I connected with Brandon Jackson on 08/09/22 at  1:30 PM EDT by a video enabled telemedicine application and verified that I am speaking with the correct person using two identifiers.  Location Provider Location : [facility] Patient Location : Work  Participants: Patient , Provider    I discussed the limitations of evaluation and management by telemedicine and the availability of in person appointments. The patient expressed understanding and agreed to proceed.    I discussed the assessment and treatment plan with the patient. The patient was provided an opportunity to ask questions and all were answered. The patient agreed with the plan and demonstrated an understanding of the instructions.   The patient was advised to call back or seek an in-person evaluation if the symptoms worsen or if the condition fails to improve as anticipated.   [facility] MD OP Progress Note  08/09/2022 1:34 PM Brandon Jackson  MRN:  [id number]  Chief Complaint:  Chief Complaint  Patient presents with   Follow-up   Anxiety   Depression   HPI: Brandon Jackson is a 41 year old Caucasian male, currently employed, lives in [place], has a history of MDD, GAD, hyperlipidemia, insomnia was evaluated by telemedicine today.  Patient today reports he has been extremely anxious, nervous, fidgety, having concentration problems due to the anxiety especially due to work-related stressors.  Patient also reports he has a new neighbor who is very loud usually at the end of the day.  That also is a stressor.  He has been unable to sleep well since the neighbor becomes very loud between 11 PM and 2 AM.  That has been a problem.  He does try to take his BuSpar which seems to help with the anxiety, interested in dosage increase.  The Lunesta higher dosage made him extremely sleepy the next day, he slept through a meeting the next morning.  Hence he is currently interested in staying on Lunesta 1 mg  and give it a chance.  Patient reports the heightened anxiety has made his depression worse, he feels sad often, has been tired, has trouble with concentration.  Recently had a day when he felt like it would have been better if he were not here however he did not dwell on the thoughts, currently denies any suicidal thoughts.  Patient denies any suicidality, homicidality or perceptual disturbances.  Currently compliant on the Wellbutrin, denies side effects.  Reports he would like to stay off of any medication that could cause sexual dysfunction.  He wants to stay away from SSRIs in general.  Patient continues to be with therapist-Mr. Debbe Bales, motivated to stay in therapy.  Patient denies any other concerns today.  Visit Diagnosis:    ICD-10-CM   1. MDD (major depressive disorder), recurrent episode, mild (HCC)  F33.0 busPIRone (BUSPAR) 10 MG tablet    2. GAD (generalized anxiety disorder)  F41.1 busPIRone (BUSPAR) 10 MG tablet    3. Insomnia due to other mental disorder  F51.05    F99    anxiety, depression, sleep hygiene issues      Past Psychiatric History: Reviewed past psychiatric history from progress note on 12/23/2021.  Past trials of Lexapro, Effexor, Wellbutrin, Lunesta, trazodone.  Past Medical History:  Past Medical History:  Diagnosis Date   Anxiety    Depression    Frequent headaches    Gout    History of chicken pox    Migraines    Ulcer  Past Surgical History:  Procedure Laterality Date   CYSTECTOMY  2003   TONSILLECTOMY AND ADENOIDECTOMY  2007   Wisdom Teeth Removal  1998    Family Psychiatric History: Reviewed family psychiatric history from progress note on 12/23/2021.  Family History:  Family History  Problem Relation Age of Onset   Depression Mother    Diabetes Father    Hyperlipidemia Father    Hypertension Father    Colon cancer Neg Hx    Prostate cancer Neg Hx     Social History: Reviewed social history from progress note on  12/23/2021. Social History   Socioeconomic History   Marital status: Significant Other    Spouse name: Not on file   Number of children: Not on file   Years of education: College   Highest education level: Not on file  Occupational History   Occupation: communications    Employer: [employer]  Tobacco Use   Smoking status: Former    Types: Cigarettes    Quit date: 2013    Years since quitting: 10.7    Passive exposure: Past   Smokeless tobacco: Never  Vaping Use   Vaping Use: Never used  Substance and Sexual Activity   Alcohol use: Yes    Comment: occasional   Drug use: No   Sexual activity: Not on file  Other Topics Concern   Not on file  Social History Narrative   Single   Was in committed relationship 2007-2023.   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     Social Determinants of Health   Financial Resource Strain: Not on file  Food Insecurity: Not on file  Transportation Needs: Not on file  Physical Activity: Not on file  Stress: Not on file  Social Connections: Not on file    Allergies:  Allergies  Allergen Reactions   Wellbutrin [Bupropion] Other (See Comments)    Anxiety increased on med when taken alone.  He can tolerate wellbutrin when taken with lexparo.    Erythromycin Diarrhea and Rash    Metabolic Disorder Labs: No results found for: "HGBA1C", "MPG" No results found for: "PROLACTIN" Lab Results  Component Value Date   CHOL 154 05/17/2022   TRIG 111.0 05/17/2022   HDL 49.60 05/17/2022   CHOLHDL 3 05/17/2022   VLDL 22.2 05/17/2022   LDLCALC 82 05/17/2022   LDLCALC 82 10/12/2021   Lab Results  Component Value Date   TSH 1.24 05/17/2022   TSH 1.310 01/05/2022    Therapeutic Level Labs: No results found for: "LITHIUM" No results found for: "VALPROATE" No results found for: "CBMZ"  Current Medications: Current Outpatient Medications  Medication Sig Dispense Refill   allopurinol (ZYLOPRIM)  100 MG tablet Take 1 tablet (100 mg total) by mouth 2 (two) times daily. 180 tablet 3   buPROPion (WELLBUTRIN SR) 150 MG 12 hr tablet Take 1 tablet (150 mg total) by mouth 2 (two) times daily. 180 tablet 1   clonazePAM (KLONOPIN) 0.5 MG tablet 0.5--1 TAB TWICE A DAY IF NEEDED FOR ANXIETY CAN TAKE 0.5-1 TAB AT NIGHT FOR INSOMNIA. SEDATION CAUTION 20 tablet 1   colchicine 0.6 MG tablet 2 po at onset of pain, may repeat 1 tab in 1 hour if still hurting- no more then 3 tablets in 24 hours 12 tablet 1   emtricitabine-tenofovir (TRUVADA) 200-300 MG tablet Take 1 tablet by mouth daily. 90 tablet 3   eszopiclone (LUNESTA) 2 MG TABS tablet Take  0.5-1 tablets (1-2 mg total) by mouth at bedtime as needed for sleep. Take immediately before bedtime 30 tablet 2   hydrOXYzine (VISTARIL) 25 MG capsule TAKE 1-3 CAPSULES (25-75 MG TOTAL) AT BEDTIME AS NEEDED FOR ANXIETY AND SLEEP. 270 capsule 0   Loratadine 10 MG CAPS Take 10 mg by mouth.     Multiple Vitamin (MULTIVITAMIN) tablet Take 1 tablet by mouth daily.     valACYclovir (VALTREX) 1000 MG tablet Take 1,000 mg by mouth 2 (two) times daily.     busPIRone (BUSPAR) 10 MG tablet Take 2 tablets (20 mg total) by mouth 2 (two) times daily. 120 tablet 0   No current facility-administered medications for this visit.     Musculoskeletal: Strength & Muscle Tone:  UTA Gait & Station:  Seated Patient leans: N/A  Psychiatric Specialty Exam: Review of Systems  Psychiatric/Behavioral:  Positive for decreased concentration, dysphoric mood and sleep disturbance. The patient is nervous/anxious.   All other systems reviewed and are negative.   There were no vitals taken for this visit.There is no height or weight on file to calculate BMI.  General Appearance: Casual  Eye Contact:  Fair  Speech:  Clear and Coherent  Volume:  Normal  Mood:  Anxious and Depressed  Affect:  Congruent  Thought Process:  Goal Directed and Descriptions of Associations: Intact   Orientation:  Full (Time, Place, and Person)  Thought Content: Logical   Suicidal Thoughts:  No  Homicidal Thoughts:  No  Memory:  Immediate;   Fair Recent;   Fair Remote;   Fair  Judgement:  Fair  Insight:  Fair  Psychomotor Activity:  Normal  Concentration:  Concentration: Fair and Attention Span: Fair  Recall:  [employer] of Knowledge: Fair  Language: Fair  Akathisia:  No  Handed:  Right  AIMS (if indicated): not done  Assets:  Communication Skills Desire for [place] Talents/Skills  ADL's:  Intact  Cognition: WNL  Sleep:  Poor   Screenings: AIMS    Flowsheet Row Video Visit from 03/03/2022 in [facility] Total Score 0      GAD-7    Flowsheet Row Video Visit from 08/09/2022 in [facility] Video Visit from 05/31/2022 in [facility] Video Visit from 01/13/2022 in [facility] Office Visit from 12/23/2021 in [facility] Visit from 09/01/2021 in [facility]  Total GAD-7 Score 19 5 7 14 17       PHQ2-9    Flowsheet Row Video Visit from 08/09/2022 in [facility] Video Visit from 05/31/2022 in [facility] Video Visit from 03/03/2022 in [facility] Video Visit from 01/13/2022 in [facility] Office Visit from 12/23/2021 in [facility]  PHQ-2 Total Score 1 0 1 1 1   PHQ-9 Total Score 11 -- -- 4 7      Flowsheet Row Video Visit from 08/09/2022 in [facility] Office Visit from 12/23/2021 in [facility] from 01/02/2021 in [facility] Low Risk No Risk No Risk        Assessment and Plan: Brandon Jackson is a 41 year old  Caucasian male, employed, lives in [place], has a history of MDD, GAD, presented with worsening anxiety, depression, sleep problems, will benefit from the following plan.  Plan MDD-unstable Wellbutrin SR 150 mg  p.o. twice daily Increase BuSpar to 15 mg p.o. twice daily for a week and then increase to BuSpar 20 mg p.o. twice daily.  Patient has supplies of the 10 mg at home and he will increase the dosage to 15 mg twice a day for now.  I have sent him BuSpar 20 mg twice a day prescription to [facility] per patient request.  GAD-unstable Increase BuSpar as noted about Klonopin 0.5 mg as needed for severe anxiety attacks, patient limiting use. Continue CBT with Mr. [name] Shell. Hydroxyzine 25-75 mg at bedtime as needed.  Insomnia-unstable Patient to work on sleep hygiene, as noted above he does have external stressors which likely affecting sleep. Continue Lunesta currently taking only 1 mg p.o. nightly as needed.  Did not tolerate the higher dosage. Discussed adding mirtazapine, patient to think about this, reevaluate in future session. Hydroxyzine 25 to 75 mg p.o. nightly as needed.   Follow-up in clinic in 2 to 3 weeks or sooner if needed. Collaboration of Care: Collaboration of Care: Referral or follow-up with counselor/therapist [facility] encouraged to stay in therapy.  Patient/Guardian was advised Release of Information must be obtained prior to any record release in order to collaborate their care with an outside provider. Patient/Guardian was advised if they have not already done so to contact the registration department to sign all necessary forms in order for [place] to release information regarding their care.   Consent: Patient/Guardian gives verbal consent for treatment and assignment of benefits for services provided during this visit. Patient/Guardian expressed understanding and agreed to proceed.   This note was generated in part or whole with voice recognition software. Voice  recognition is usually quite accurate but there are transcription errors that can and very often do occur. I apologize for any typographical errors that were not detected and corrected.      Ursula Alert, MD 08/10/2022, 10:30 AM

## 2022-08-17 ENCOUNTER — Ambulatory Visit (INDEPENDENT_AMBULATORY_CARE_PROVIDER_SITE_OTHER): Payer: BC Managed Care – PPO | Admitting: Psychology

## 2022-08-17 DIAGNOSIS — F411 Generalized anxiety disorder: Secondary | ICD-10-CM | POA: Diagnosis not present

## 2022-08-17 DIAGNOSIS — F330 Major depressive disorder, recurrent, mild: Secondary | ICD-10-CM

## 2022-08-17 NOTE — Progress Notes (Signed)
[facility] Counselor/Therapist Progress Note  Patient ID: Brandon Jackson, MRN: [id number]    Date: 08/17/22  Time Spent: 4:03 pm - 5:01pm : 60 Minutes  Treatment Type: Individual Therapy.  Reported Symptoms: Depression and anxiety.  Mental Status Exam: Appearance:  Neat and Well Groomed     Behavior: Appropriate  Motor: Normal  Speech/Language:  Clear and Coherent and Normal Rate  Affect: Congruent  Mood: normal  Thought process: normal  Thought content:   WNL  Sensory/Perceptual disturbances:   WNL  Orientation: oriented to person, place, time/date, and situation  Attention: Good  Concentration: Good  Memory: WNL  Fund of knowledge:  Good  Insight:   Good  Judgment:  Good  Impulse Control: Good   Risk Assessment: Danger to Self:  No Self-injurious Behavior: No Danger to Others: No Duty to Warn:no Physical Aggression / Violence:No  Access to Firearms a concern: No  Gang Involvement:No   Subjective:   [name] participated from home, via video, and consented to treatment. Therapist participated from home office. We met online due to [place] pandemic. Tong reviewed the events of the past week. Jatavian noted increased work related anxiety over the past two weeks with some relief during this week. He noted the anxiety being accompanied with some mild depressive symptoms including sadness and poor sleep. Maddix noted continued guilt in relation to his recent breakup. He noted feeling disloyal, a jerk, not tough enough, or smart enough to head it off earlier. He noted guilt for "giving up". He explored relationship anxiety during the session. He noted working on spending time away and noted this being "strange". Abem noted "I would have figured out myself better, what I really need" in relation to life stages. We explored this during the session. He noted a recent medication change from Buspar 32m to  220m He is currently titrating to the 20 mg  dose.  He noted his relationship and discussed his self-talk including "I am afraid to be hopeful" because it's risky.  We explored this in the session.  Therapist encouraged MiLakshyao identify his needs in the relationship and ways to identify sense of self as he navigates relationships.  Reviewed communication, assertiveness, and discussing needs proactively.  Therapist modeled this during the session.  MiCopelandas engaged and motivated during the session.  He expressed commitment towards her goals.  He denied any suicidal ideation.  He does have a safety plan in place.  Therapist validated and normalized Jebediah's feelings.  Therapist provided supportive therapy.  Follow-up was scheduled for continued treatment.  MiAngelaontinues to benefit from consistent therapy sessions.   Interventions: Cognitive Behavioral Therapy  Diagnosis:  No diagnosis found.   Plan: Patient is to use CBT, BA, ACT, mindfulness, and coping skills to help manage decrease symptoms associated with their diagnosis.  Related Problem: Develop healthy thinking patterns and beliefs about self, others, and the world that lead to the alleviation and help prevent the relapse of depression. Description: Verbalize insight into how past relationships may be influencing current experiences with depression. Target Date: 2022-08-22 Progress: 30%  Related Problem: Develop healthy thinking patterns and beliefs about self, others, and the world that lead to the alleviation and help prevent the relapse of depression. Description: Cooperate with a medication evaluation by a physician. Target Date: 2022-08-22 Progress: 100%  Related Problem: Develop healthy thinking patterns and beliefs about self, others, and the world that lead to the alleviation and help prevent the relapse of depression. Description:  Verbalize an understanding and resolution of current interpersonal problems. Target Date: 2022-08-22 Progress: 40%  Related  Problem: Develop healthy thinking patterns and beliefs about self, others, and the world that lead to the alleviation and help prevent the relapse of depression. Description: Learn and implement behavioral strategies to overcome depression. Target Date: 2022-08-22 Progress: 40%  Related Problem: Develop healthy thinking patterns and beliefs about self, others, and the world that lead to the alleviation and help prevent the relapse of depression. Description: Identify and replace thoughts and beliefs that support depression. Target Date: 2022-08-22 Progress: 40%  Related Problem: Develop healthy thinking patterns and beliefs about self, others, and the world that lead to the alleviation and help prevent the relapse of depression. Description: Learn and implement conflict resolution skills to resolve interpersonal problems. Target Date: 2022-08-22 Progress: 40%  Related Problem: Reach a balance between work/competitive and social/noncompetitive time in daily life. Description: Increase daily time involved in relaxing activities. Target Date: 2022-08-22 Progress: 20%  Related Problem: Reach a balance between work/competitive and social/noncompetitive time in daily life. Description: Decrease the number of hours worked daily and the frequency of taking work home. Target Date: 2022-08-22 Progress: 50%  Related Problem: Reduce overall frequency, intensity, and duration of the anxiety so that daily functioning is not impaired. Description: Identify, challenge, and replace biased, fearful self-talk with positive, realistic, and empowering self-talk. Target Date: 2022-08-22 Progress: 30%  Related Problem: Reduce overall frequency, intensity, and duration of the anxiety so that daily functioning is not impaired. Description: Describe situations, thoughts, feelings, and actions associated with anxieties and worries, their impact on functioning, and attempts to resolve them. Target Date:  2022-08-22 Progress: 40%  Related Problem: Reduce overall frequency, intensity, and duration of the anxiety so that daily functioning is not impaired. Description: Learn and implement problem-solving strategies for realistically addressing worries. Target Date: 2022-08-22 Progress: 40%  Related Problem: Reduce overall frequency, intensity, and duration of the anxiety so that daily functioning is not impaired. Description: Learn and implement personal and interpersonal skills to reduce anxiety and improve interpersonal relationships. Target Date: 2022-08-22 Progress: 30%   Related Problem: Reduce overall frequency, intensity, and duration of the anxiety so that daily functioning is not impaired. Description: Learn and implement relapse prevention strategies for managing possible future anxiety symptoms. Target Date: 2022-08-22 Progress: 50%  Related Problem: Reduce overall frequency, intensity, and duration of the anxiety so that daily functioning is not impaired. Description: Identify and engage in pleasant activities on a daily basis. Target Date: 2022-08-22 Progress: 50%  [name], LCSW

## 2022-08-31 ENCOUNTER — Ambulatory Visit (INDEPENDENT_AMBULATORY_CARE_PROVIDER_SITE_OTHER): Payer: BC Managed Care – PPO | Admitting: Psychology

## 2022-08-31 DIAGNOSIS — F411 Generalized anxiety disorder: Secondary | ICD-10-CM

## 2022-08-31 DIAGNOSIS — F330 Major depressive disorder, recurrent, mild: Secondary | ICD-10-CM

## 2022-08-31 NOTE — Progress Notes (Signed)
[facility] Counselor/Therapist Progress Note  Patient ID: Brandon Jackson, MRN: [id number]    Date: 09/02/22  Time Spent: 12:31 pm - 1:05 pm : 34 Minutes  Treatment Type: Individual Therapy.  Reported Symptoms: Depression and anxiety.  Mental Status Exam: Appearance:  Neat and Well Groomed     Behavior: Appropriate  Motor: Normal  Speech/Language:  Clear and Coherent and Normal Rate  Affect: Congruent  Mood: normal  Thought process: normal  Thought content:   WNL  Sensory/Perceptual disturbances:   WNL  Orientation: oriented to person, place, time/date, and situation  Attention: Good  Concentration: Good  Memory: WNL  Fund of knowledge:  Good  Insight:   Good  Judgment:  Good  Impulse Control: Good   Risk Assessment: Danger to Self:  No Self-injurious Behavior: No Danger to Others: No Duty to Warn:no Physical Aggression / Violence:No  Access to Firearms a concern: No  Gang Involvement:No   Subjective:   [name] participated from office, via video, and consented to treatment. Therapist participated from home office. We met online due to [place] pandemic. [name] reviewed the events of the past week. [name] nocted contacting the crisis hotline. Anxiety through the "roof about everything". He noted attempting to reframe his anxiety but noted this not being effectual.  No speaking to a psychologist friend that encouraged him to procure a book reviewing ACT.  Psychoeducation regarding ECT was provided during the session.  [name] any safety concerns during the session and is aware of the safety protocol should the need arise.  Discussed identifying negative self talk including feeling a failure, and not trusting his own judgment.  We explored this during the session.  He noted his attempts to resolve issues at work and often felt as though his suggestions were disregarded.  We explored his feelings regarding this during the session.  Therapist  encouraged [name] to actively managing symptoms, identify areas of control and lack of control and creating a list, working on areas of control.  We worked on delineating where he has set work and where he does not and discussed the importance of differentiating between the two.  Discussed relationship stressors which we explored during the session as well.  Therapist validated and normalized [name]'s feelings and experience, and encouraged continued self-care.  Follow-up was scheduled for continued treatment.  Therapist provided supportive therapy.  We reviewed coping skills.   Interventions: Cognitive Behavioral Therapy & ACT.  Diagnosis:  MDD (major depressive disorder), recurrent episode, mild (HCC)  GAD (generalized anxiety disorder)   Plan: Patient is to use CBT, BA, ACT, mindfulness, and coping skills to help manage decrease symptoms associated with their diagnosis.  Related Problem: Develop healthy thinking patterns and beliefs about self, others, and the world that lead to the alleviation and help prevent the relapse of depression. Description: Verbalize insight into how past relationships may be influencing current experiences with depression. Target Date: 2022-09-22 Progress: 30%  Related Problem: Develop healthy thinking patterns and beliefs about self, others, and the world that lead to the alleviation and help prevent the relapse of depression. Description: Cooperate with a medication evaluation by a physician. Target Date:  2022-09-22 Progress: 100%  Related Problem: Develop healthy thinking patterns and beliefs about self, others, and the world that lead to the alleviation and help prevent the relapse of depression. Description: Verbalize an understanding and resolution of current interpersonal problems. Target Date:  2022-09-22 Progress: 40%  Related Problem: Develop healthy thinking patterns and beliefs about self,  others, and the world that lead to the alleviation  and help prevent the relapse of depression. Description: Learn and implement behavioral strategies to overcome depression. Target Date:  2022-09-22 Progress: 40%  Related Problem: Develop healthy thinking patterns and beliefs about self, others, and the world that lead to the alleviation and help prevent the relapse of depression. Description: Identify and replace thoughts and beliefs that support depression. Target Date:  2022-09-22 Progress: 40%  Related Problem: Develop healthy thinking patterns and beliefs about self, others, and the world that lead to the alleviation and help prevent the relapse of depression. Description: Learn and implement conflict resolution skills to resolve interpersonal problems. Target Date:  2022-09-22 Progress: 40%  Related Problem: Reach a balance between work/competitive and social/noncompetitive time in daily life. Description: Increase daily time involved in relaxing activities. Target Date:  2022-09-22 Progress: 20%  Related Problem: Reach a balance between work/competitive and social/noncompetitive time in daily life. Description: Decrease the number of hours worked daily and the frequency of taking work home. Target Date: 2022-09-22 Progress: 50%  Related Problem: Reduce overall frequency, intensity, and duration of the anxiety so that daily functioning is not impaired. Description: Identify, challenge, and replace biased, fearful self-talk with positive, realistic, and empowering self-talk. Target Date: 2022-09-22 Progress: 30%  Related Problem: Reduce overall frequency, intensity, and duration of the anxiety so that daily functioning is not impaired. Description: Describe situations, thoughts, feelings, and actions associated with anxieties and worries, their impact on functioning, and attempts to resolve them. Target Date:  2022-09-22 Progress: 40%  Related Problem: Reduce overall frequency, intensity, and duration of the anxiety so that  daily functioning is not impaired. Description: Learn and implement problem-solving strategies for realistically addressing worries. Target Date:  2022-09-22 Progress: 40%  Related Problem: Reduce overall frequency, intensity, and duration of the anxiety so that daily functioning is not impaired. Description: Learn and implement personal and interpersonal skills to reduce anxiety and improve interpersonal relationships. Target Date:  2022-09-22 Progress: 30%   Related Problem: Reduce overall frequency, intensity, and duration of the anxiety so that daily functioning is not impaired. Description: Learn and implement relapse prevention strategies for managing possible future anxiety symptoms. Target Date:  2022-09-22 Progress: 50%  Related Problem: Reduce overall frequency, intensity, and duration of the anxiety so that daily functioning is not impaired. Description: Identify and engage in pleasant activities on a daily basis. Target Date:  2022-09-22 Progress: 50%  [name], LCSW

## 2022-09-03 ENCOUNTER — Encounter: Payer: Self-pay | Admitting: Psychiatry

## 2022-09-03 ENCOUNTER — Ambulatory Visit: Payer: BC Managed Care – PPO | Admitting: Psychiatry

## 2022-09-03 VITALS — BP 157/95 | HR 90 | Temp 97.8°F | Ht 67.0 in | Wt 195.2 lb

## 2022-09-03 DIAGNOSIS — F5105 Insomnia due to other mental disorder: Secondary | ICD-10-CM

## 2022-09-03 DIAGNOSIS — F99 Mental disorder, not otherwise specified: Secondary | ICD-10-CM

## 2022-09-03 DIAGNOSIS — F330 Major depressive disorder, recurrent, mild: Secondary | ICD-10-CM

## 2022-09-03 DIAGNOSIS — F411 Generalized anxiety disorder: Secondary | ICD-10-CM | POA: Diagnosis not present

## 2022-09-03 NOTE — Progress Notes (Signed)
BH MD OP Progress Note  09/03/2022 10:33 AM Brandon Jackson  MRN:  [id number]  Chief Complaint:  Chief Complaint  Patient presents with   Follow-up   Anxiety   Depression   Medication Refill   HPI: Brandon Jackson is a 41 year old Caucasian male, currently employed, lives in [place], has a history of MDD, GAD, hyperlipidemia, insomnia was evaluated in office today.  Patient today reports he continues to have anxiety and ups and downs in his mood.  Reports it is usually triggered by his situational stressors like his work and his relationship.  Reports his workload continues to be very high. Usually by Sunday he starts getting anxious about the next week.  It was worse this past Monday.  Reports he had racing thoughts and significant anxiety and there was a point when he thought about why he was alive.  Patient hence contacted his therapist and had a therapy session on Tuesday which helped.  Reports he has a psychologist friend who also discussed ACT therapy strategies with him.  He has been trying to practice that and that seems to be helpful as well.  Patient reports he is currently on the higher dosage of BuSpar, would like to give it more time.  Denies side effects.  Using clonazepam as needed, some weeks he has been taking it daily and other times limiting it.  Aware that he should not be on it too long and should not be taking it every day.  Patient reports he worries a lot about his previous relationship, continues to be remorseful.  However currently is in a new relationship which is going fairly okay.  Reports sleep is getting better compared to how it was last visit.  Does not take the Lunesta every night.  Usually takes 1/2 tablet which works sometimes and some nights it does not.  Denies side effects.  Patient denies any suicidality, homicidality or perceptual disturbances at this time.  Visit Diagnosis:    ICD-10-CM   1. MDD (major depressive disorder), recurrent  episode, mild (HCC)  F33.0     2. GAD (generalized anxiety disorder)  F41.1     3. Insomnia due to other mental disorder  F51.05    F99    anxiety, depression, sleep hygiene problems      Past Psychiatric History: Reviewed past psychiatric history from progress note on 12/23/2021.  Past trials of Lexapro, Effexor, Wellbutrin, Lunesta, trazodone.  Past Medical History:  Past Medical History:  Diagnosis Date   Anxiety    Depression    Frequent headaches    Gout    History of chicken pox    Migraines    Ulcer     Past Surgical History:  Procedure Laterality Date   CYSTECTOMY  2003   TONSILLECTOMY AND ADENOIDECTOMY  2007   Wisdom Teeth Removal  1998    Family Psychiatric History: Reviewed family psychiatric history from progress note on 12/23/2021.  Family History:  Family History  Problem Relation Age of Onset   Depression Mother    Diabetes Father    Hyperlipidemia Father    Hypertension Father    Colon cancer Neg Hx    Prostate cancer Neg Hx     Social History: Reviewed social history from progress note on 12/23/2021. Social History   Socioeconomic History   Marital status: Significant Other    Spouse name: Not on file   Number of children: Not on file   Years of education: College   Highest  education level: Not on file  Occupational History   Occupation: communications    Employer: [employer]  Tobacco Use   Smoking status: Former    Types: Cigarettes    Quit date: 2013    Years since quitting: 10.8    Passive exposure: Past   Smokeless tobacco: Never  Vaping Use   Vaping Use: Never used  Substance and Sexual Activity   Alcohol use: Yes    Comment: occasional   Drug use: No   Sexual activity: Not on file  Other Topics Concern   Not on file  Social History Narrative   Single   Was in committed relationship 2007-2023.   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     Social Determinants of Health    Financial Resource Strain: Not on file  Food Insecurity: Not on file  Transportation Needs: Not on file  Physical Activity: Not on file  Stress: Not on file  Social Connections: Not on file    Allergies:  Allergies  Allergen Reactions   Wellbutrin [Bupropion] Other (See Comments)    Anxiety increased on med when taken alone.  He can tolerate wellbutrin when taken with lexparo.    Erythromycin Diarrhea and Rash    Metabolic Disorder Labs: No results found for: "HGBA1C", "MPG" No results found for: "PROLACTIN" Lab Results  Component Value Date   CHOL 154 05/17/2022   TRIG 111.0 05/17/2022   HDL 49.60 05/17/2022   CHOLHDL 3 05/17/2022   VLDL 22.2 05/17/2022   LDLCALC 82 05/17/2022   LDLCALC 82 10/12/2021   Lab Results  Component Value Date   TSH 1.24 05/17/2022   TSH 1.310 01/05/2022    Therapeutic Level Labs: No results found for: "LITHIUM" No results found for: "VALPROATE" No results found for: "CBMZ"  Current Medications: Current Outpatient Medications  Medication Sig Dispense Refill   allopurinol (ZYLOPRIM) 100 MG tablet Take 1 tablet (100 mg total) by mouth 2 (two) times daily. 180 tablet 3   buPROPion (WELLBUTRIN SR) 150 MG 12 hr tablet Take 1 tablet (150 mg total) by mouth 2 (two) times daily. 180 tablet 1   busPIRone (BUSPAR) 10 MG tablet Take 2 tablets (20 mg total) by mouth 2 (two) times daily. 120 tablet 0   clonazePAM (KLONOPIN) 0.5 MG tablet 0.5--1 TAB TWICE A DAY IF NEEDED FOR ANXIETY CAN TAKE 0.5-1 TAB AT NIGHT FOR INSOMNIA. SEDATION CAUTION 20 tablet 1   colchicine 0.6 MG tablet 2 po at onset of pain, may repeat 1 tab in 1 hour if still hurting- no more then 3 tablets in 24 hours 12 tablet 1   emtricitabine-tenofovir (TRUVADA) 200-300 MG tablet Take 1 tablet by mouth daily. 90 tablet 3   eszopiclone (LUNESTA) 2 MG TABS tablet Take 0.5-1 tablets (1-2 mg total) by mouth at bedtime as needed for sleep. Take immediately before bedtime 30 tablet 2    hydrOXYzine (VISTARIL) 25 MG capsule TAKE 1-3 CAPSULES (25-75 MG TOTAL) AT BEDTIME AS NEEDED FOR ANXIETY AND SLEEP. 270 capsule 0   Loratadine 10 MG CAPS Take 10 mg by mouth.     Multiple Vitamin (MULTIVITAMIN) tablet Take 1 tablet by mouth daily.     valACYclovir (VALTREX) 1000 MG tablet Take 1,000 mg by mouth 2 (two) times daily.     No current facility-administered medications for this visit.     Musculoskeletal: Strength & Muscle Tone: within normal limits Gait & Station: normal Patient  leans: N/A  Psychiatric Specialty Exam: Review of Systems  HENT:  Positive for congestion.   Psychiatric/Behavioral:  Positive for dysphoric mood and sleep disturbance. The patient is nervous/anxious.   All other systems reviewed and are negative.   Blood pressure (!) 142/88, pulse 90, temperature 97.8 F (36.6 C), temperature source Oral, height 5\' 7"  (1.702 m), weight 195 lb 3.2 oz (88.5 kg).Body mass index is 30.57 kg/m.  General Appearance: Casual  Eye Contact:  Good  Speech:  Clear and Coherent  Volume:  Normal  Mood:  Anxious and Depressed  Affect:  Congruent  Thought Process:  Goal Directed and Descriptions of Associations: Intact  Orientation:  Full (Time, Place, and Person)  Thought Content: Logical   Suicidal Thoughts:  No  Homicidal Thoughts:  No  Memory:  Immediate;   Fair Recent;   Fair Remote;   Fair  Judgement:  Fair  Insight:  Fair  Psychomotor Activity:  Normal  Concentration:  Concentration: Fair and Attention Span: Fair  Recall:  of Knowledge: Fair  Language: Fair  Akathisia:  No  Handed:  Right  AIMS (if indicated): not done  Assets:  Communication Skills Desire for Improvement Housing Intimacy Social Support Talents/Skills  ADL's:  Intact  Cognition: WNL  Sleep:   restless   Screenings: AIMS    Flowsheet Row Video Visit from 03/03/2022 in [facility]  AIMS Total Score 0      GAD-7    Flowsheet Row Office  Visit from 09/03/2022 in [facility] Video Visit from 08/09/2022 in [facility] Video Visit from 05/31/2022 in [facility] Video Visit from 01/13/2022 in [facility] Office Visit from 12/23/2021 in [facility]  Total GAD-7 Score 15 19 5 7 14       PHQ2-9    Flowsheet Row Office Visit from 09/03/2022 in [facility] Video Visit from 08/09/2022 in [facility] Video Visit from 05/31/2022 in [facility] Video Visit from 03/03/2022 in [facility] Video Visit from 01/13/2022 in [facility]  PHQ-2 Total Score 1 1 0 1 1  PHQ-9 Total Score 9 11 -- -- 4      Flowsheet Row Office Visit from 09/03/2022 in [facility] Video Visit from 08/09/2022 in [facility] Office Visit from 12/23/2021 in [facility]  C-SSRS RISK CATEGORY Low Risk Low Risk No Risk        Assessment and Plan: JOBIE POPP is a 41 year old Caucasian male, employed, lives in [place], has a history of MDD, GAD, presented with depression, anxiety, sleep problems, with recent readjustment of BuSpar will benefit from psychotherapy sessions and following plan.  Plan MDD-some improvement Wellbutrin SR 150 mg p.o. twice daily BuSpar 20 mg p.o. twice daily Continue CBT on a weekly basis-Mr.44.  GAD-unstable BuSpar increased as noted above, could readjust the dosage if symptoms worsens.  Patient however is not interested in further dosage readjustment today. Continue Klonopin 0.5 mg as needed for severe anxiety attacks-advised to limit use. Hydroxyzine 25-75 mg at bedtime as needed Continue CBT  Insomnia-improving Continue Lunesta 1-2 mg p.o. nightly as  needed Hydroxyzine 25 to 75 mg p.o. nightly as needed Discussed adding medications like Relaxium OTC.  Discussed intermittent FMLA with patient, patient to contact HR and reach back to [place].  Follow-up in clinic in 4 to 6 weeks or sooner  if needed.   This note was generated in part or whole with voice recognition software. Voice recognition is usually quite accurate but there are transcription errors that can and very often do occur. I apologize for any typographical errors that were not detected and corrected.     Ursula Alert, MD 09/03/2022, 10:33 AM

## 2022-09-09 ENCOUNTER — Ambulatory Visit (INDEPENDENT_AMBULATORY_CARE_PROVIDER_SITE_OTHER): Payer: BC Managed Care – PPO | Admitting: Psychology

## 2022-09-09 DIAGNOSIS — F330 Major depressive disorder, recurrent, mild: Secondary | ICD-10-CM

## 2022-09-09 DIAGNOSIS — F411 Generalized anxiety disorder: Secondary | ICD-10-CM | POA: Diagnosis not present

## 2022-09-09 NOTE — Progress Notes (Addendum)
[facility] Counselor/Therapist Progress Note  Patient ID: Brandon Jackson, MRN: [id number]    Date: 09/09/22  Time Spent: 8:06 am - 8:59 am : 53 Minutes  Treatment Type: Individual Therapy.  Reported Symptoms: Depression and anxiety.  Mental Status Exam: Appearance:  Neat and Well Groomed     Behavior: Appropriate  Motor: Normal  Speech/Language:  Clear and Coherent and Normal Rate  Affect: Congruent  Mood: normal  Thought process: normal  Thought content:   WNL  Sensory/Perceptual disturbances:   WNL  Orientation: oriented to person, place, time/date, and situation  Attention: Good  Concentration: Good  Memory: WNL  Fund of knowledge:  Good  Insight:   Good  Judgment:  Good  Impulse Control: Good   Risk Assessment: Danger to Self:  No Self-injurious Behavior: No Danger to Others: No Duty to Warn:no Physical Aggression / Violence:No  Access to Firearms a concern: No  Gang Involvement:No   Subjective:   Brandon Jackson participated from home, via video, and consented to treatment. Therapist participated from home office. We met online due to [place] pandemic. Brandon Jackson reviewed the events of the past week. Brandon Jackson noted working on managing his rumination an focusing more on this and actively managing it. He noted low frustration tolerance / failure tolerance when "things aren't easy". He noted feeling stretched thin by his role at work. 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 We processed and explored this during the session. We discussed interpersonal stressors and his current coping during the session. We explored ways to address his stressors proactively via communicating and problem-solving. Therapist validated and normalized Brandon Jackson's feelings and experience, and encouraged continued self-care.  Follow-up was scheduled  for continued treatment.  Therapist provided supportive therapy.  Interventions: Cognitive Behavioral Therapy & interpersonal  Diagnosis:  No diagnosis found.   Plan: Patient is to use CBT, BA, ACT, mindfulness, and coping skills to help manage decrease symptoms associated with their diagnosis.  Related Problem: Develop healthy thinking patterns and beliefs about self, others, and the world that lead to the alleviation and help prevent the relapse of depression. Description: Verbalize insight into how past relationships may be influencing current experiences with depression. Target Date: 2022-09-22 Progress: 30%  Related Problem: Develop healthy thinking patterns and beliefs about self, others, and the world that lead to the alleviation and help prevent the relapse of depression. Description: Cooperate with a medication evaluation by a physician. Target Date:  2022-09-22 Progress: 100%  Related Problem: Develop healthy thinking patterns and beliefs about self, others, and the world that lead to the alleviation and help prevent the relapse of depression. Description: Verbalize an understanding and resolution of current interpersonal problems. Target Date:  2022-09-22 Progress: 40%  Related Problem: Develop healthy thinking patterns and beliefs about self, others, and the world that lead to the alleviation and help prevent the relapse of depression. Description: Learn and implement behavioral strategies to overcome depression. Target Date:  2022-09-22 Progress: 40%  Related Problem: Develop healthy thinking patterns and beliefs about self, others, and the world that lead to the alleviation and help prevent the relapse of depression. Description: Identify and replace thoughts and beliefs that support depression. Target Date:  2022-09-22 Progress: 40%  Related Problem: Develop healthy thinking patterns and beliefs about self, others, and the world that lead to the alleviation and help  prevent the relapse of  depression. Description: Learn and implement conflict resolution skills to resolve interpersonal problems. Target Date:  2022-09-22 Progress: 40%  Related Problem: Reach a balance between work/competitive and social/noncompetitive time in daily life. Description: Increase daily time involved in relaxing activities. Target Date:  2022-09-22 Progress: 20%  Related Problem: Reach a balance between work/competitive and social/noncompetitive time in daily life. Description: Decrease the number of hours worked daily and the frequency of taking work home. Target Date: 2022-09-22 Progress: 50%  Related Problem: Reduce overall frequency, intensity, and duration of the anxiety so that daily functioning is not impaired. Description: Identify, challenge, and replace biased, fearful self-talk with positive, realistic, and empowering self-talk. Target Date: 2022-09-22 Progress: 30%  Related Problem: Reduce overall frequency, intensity, and duration of the anxiety so that daily functioning is not impaired. Description: Describe situations, thoughts, feelings, and actions associated with anxieties and worries, their impact on functioning, and attempts to resolve them. Target Date:  2022-09-22 Progress: 40%  Related Problem: Reduce overall frequency, intensity, and duration of the anxiety so that daily functioning is not impaired. Description: Learn and implement problem-solving strategies for realistically addressing worries. Target Date:  2022-09-22 Progress: 40%  Related Problem: Reduce overall frequency, intensity, and duration of the anxiety so that daily functioning is not impaired. Description: Learn and implement personal and interpersonal skills to reduce anxiety and improve interpersonal relationships. Target Date:  2022-09-22 Progress: 30%   Related Problem: Reduce overall frequency, intensity, and duration of the anxiety so that daily functioning is not  impaired. Description: Learn and implement relapse prevention strategies for managing possible future anxiety symptoms. Target Date:  2022-09-22 Progress: 50%  Related Problem: Reduce overall frequency, intensity, and duration of the anxiety so that daily functioning is not impaired. Description: Identify and engage in pleasant activities on a daily basis. Target Date:  2022-09-22 Progress: 50%  [name], LCSW

## 2022-09-14 ENCOUNTER — Telehealth: Payer: BC Managed Care – PPO | Admitting: Psychiatry

## 2022-09-22 ENCOUNTER — Ambulatory Visit: Payer: BC Managed Care – PPO | Admitting: Psychology

## 2022-09-23 ENCOUNTER — Other Ambulatory Visit: Payer: Self-pay | Admitting: Psychiatry

## 2022-09-23 DIAGNOSIS — F330 Major depressive disorder, recurrent, mild: Secondary | ICD-10-CM

## 2022-09-23 DIAGNOSIS — F411 Generalized anxiety disorder: Secondary | ICD-10-CM

## 2022-10-08 ENCOUNTER — Ambulatory Visit (INDEPENDENT_AMBULATORY_CARE_PROVIDER_SITE_OTHER): Payer: BC Managed Care – PPO | Admitting: Psychology

## 2022-10-08 DIAGNOSIS — F411 Generalized anxiety disorder: Secondary | ICD-10-CM | POA: Diagnosis not present

## 2022-10-08 DIAGNOSIS — F330 Major depressive disorder, recurrent, mild: Secondary | ICD-10-CM

## 2022-10-08 NOTE — Progress Notes (Signed)
[facility] Counselor/Therapist Progress Note  Patient ID: Brandon Jackson, MRN: [id number]    Date: 10/08/22  Time Spent: 8:06 am - 8:57 am : 76 Minutes  Treatment Type: Individual Therapy.  Reported Symptoms: Depression and anxiety.  Mental Status Exam: Appearance:  Neat and Well Groomed     Behavior: Appropriate  Motor: Normal  Speech/Language:  Clear and Coherent and Normal Rate  Affect: Congruent  Mood: anxious  Thought process: normal  Thought content:   WNL  Sensory/Perceptual disturbances:   WNL  Orientation: oriented to person, place, time/date, and situation  Attention: Good  Concentration: Good  Memory: WNL  Fund of knowledge:  Good  Insight:   Good  Judgment:  Good  Impulse Control: Good   Risk Assessment: Danger to Self:  No Self-injurious Behavior: No Danger to Others: No Duty to Warn:no Physical Aggression / Violence:No  Access to Firearms a concern: No  Gang Involvement:No   Subjective:   Brandon Jackson participated from home, via video, and consented to treatment. Therapist participated from home office. We met online due to Brandon Jackson pandemic. Brandon Jackson reviewed the events of the past week. Brandon Jackson noted feeling sick and noted worry that he is continually getting ill and discussed worry about this being "psychosomatic". We explored this during the session and discussed his current method(s) of managing this. He noted fluctuating anxiety and an increase in his BP. He noted a need to discuss concerns with his provider regarding this. 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 He noted working on his self-esteem and noted identifying the critical voice being his father. We will continue to explore this going forward. Therapist encouraged Brandon Jackson to identify his needs in relationships and to journal regarding this for future processing. Therapist praised Brandon Jackson for his work towards improving his  self-esteem via a workbook. Therapist validated and normalized Brandon Jackson's feelings and experience, and encouraged continued self-care.  Follow-up was scheduled for continued treatment.  Therapist provided supportive therapy.  Interventions: Cognitive Behavioral Therapy & interpersonal  Diagnosis:  MDD (major depressive disorder), recurrent episode, mild (HCC)  GAD (generalized anxiety disorder)   Plan: Patient is to use CBT, BA, ACT, mindfulness, and coping skills to help manage decrease symptoms associated with their diagnosis.  Related Problem: Develop healthy thinking patterns and beliefs about self, others, and the world that lead to the alleviation and help prevent the relapse of depression. Description: Verbalize insight into how past relationships may be influencing current experiences with depression. Target Date: 2022-10-22 Progress: 30%  Related Problem: Develop healthy thinking patterns and beliefs about self, others, and the world that lead to the alleviation and help prevent the relapse of depression. Description: Cooperate with a medication evaluation by a physician. Target Date:  2022-10-22 Progress: 100%  Related Problem: Develop healthy thinking patterns and beliefs about self, others, and the world that lead to the alleviation and help prevent the relapse of depression. Description: Verbalize an understanding and resolution of current interpersonal problems. Target Date:  2022-10-22 Progress: 40%  Related Problem: Develop healthy thinking patterns and beliefs about self, others, and the world that lead to the alleviation and help prevent the relapse of depression. Description: Learn and implement behavioral strategies to overcome depression. Target Date:  2022-10-22 Progress: 40%  Related Problem: Develop healthy thinking patterns and beliefs about self, others, and the world that lead to the alleviation and help prevent the relapse of depression. Description:  Identify and replace thoughts  and beliefs that support depression. Target Date: 2022-10-22 Progress: 40%  Related Problem: Develop healthy thinking patterns and beliefs about self, others, and the world that lead to the alleviation and help prevent the relapse of depression. Description: Learn and implement conflict resolution skills to resolve interpersonal problems. Target Date:  2022-10-22 Progress: 40%  Related Problem: Reach a balance between work/competitive and social/noncompetitive time in daily life. Description: Increase daily time involved in relaxing activities. Target Date:  2022-10-22 Progress: 20%  Related Problem: Reach a balance between work/competitive and social/noncompetitive time in daily life. Description: Decrease the number of hours worked daily and the frequency of taking work home. Target Date: 2022-10-22 Progress: 50%  Related Problem: Reduce overall frequency, intensity, and duration of the anxiety so that daily functioning is not impaired. Description: Identify, challenge, and replace biased, fearful self-talk with positive, realistic, and empowering self-talk. Target Date: 2022-10-22 Progress: 30%  Related Problem: Reduce overall frequency, intensity, and duration of the anxiety so that daily functioning is not impaired. Description: Describe situations, thoughts, feelings, and actions associated with anxieties and worries, their impact on functioning, and attempts to resolve them. Target Date:  2022-10-22 Progress: 40%  Related Problem: Reduce overall frequency, intensity, and duration of the anxiety so that daily functioning is not impaired. Description: Learn and implement problem-solving strategies for realistically addressing worries. Target Date:  2022-10-22 Progress: 40%  Related Problem: Reduce overall frequency, intensity, and duration of the anxiety so that daily functioning is not impaired. Description: Learn and implement personal and  interpersonal skills to reduce anxiety and improve interpersonal relationships. Target Date:  2022-10-22 Progress: 30%  Related Problem: Reduce overall frequency, intensity, and duration of the anxiety so that daily functioning is not impaired. Description: Learn and implement relapse prevention strategies for managing possible future anxiety symptoms. Target Date:  2022-10-22 Progress: 50%  Related Problem: Reduce overall frequency, intensity, and duration of the anxiety so that daily functioning is not impaired. Description: Identify and engage in pleasant activities on a daily basis. Target Date:  2022-10-22 Progress: 50%   [name], LCSW

## 2022-10-09 ENCOUNTER — Other Ambulatory Visit: Payer: Self-pay | Admitting: Family Medicine

## 2022-10-11 ENCOUNTER — Ambulatory Visit: Payer: BC Managed Care – PPO | Admitting: Family Medicine

## 2022-10-11 ENCOUNTER — Encounter: Payer: Self-pay | Admitting: Family Medicine

## 2022-10-11 VITALS — BP 118/78 | HR 77 | Temp 97.8°F | Ht 67.0 in | Wt 195.0 lb

## 2022-10-11 DIAGNOSIS — F411 Generalized anxiety disorder: Secondary | ICD-10-CM | POA: Diagnosis not present

## 2022-10-11 DIAGNOSIS — Z8709 Personal history of other diseases of the respiratory system: Secondary | ICD-10-CM | POA: Diagnosis not present

## 2022-10-11 DIAGNOSIS — B001 Herpesviral vesicular dermatitis: Secondary | ICD-10-CM

## 2022-10-11 MED ORDER — VALACYCLOVIR HCL 500 MG PO TABS: 500.0000 mg | ORAL_TABLET | Freq: Every day | ORAL | 1 refills | Status: DC

## 2022-10-11 NOTE — Progress Notes (Unsigned)
Valtrex use d/w pt.  Had sig cold sore in October with residual episodic tingling in the meantime.  Used valtrex prn in the meantime.   Work d/w pt.  He is trying to manage his work demands.  D/w pt about relationship between stressors and BP.  Episodic BP elevation.  D/w pt about URI sx and cold medicine use.  He has had inner ear pressure, drainage, fevers and fatigue off and on. Doesn't feel like he has had a good week were he has felt good since September.  Last fever was a few weeks ago. No facial pain.    Was out of work half day on 10/07/22.  Took dayquil at the time.   Has had GI illness once, in addition to other episodes with URI illnesses.  Recurrent episodic URI events.  Some days he feels back to baseline.  He feels well today.    D/w pt about getting a flu shot when well for at least 1 week.    Meds, vitals, and allergies reviewed.   ROS: Per HPI unless specifically indicated in ROS section   GEN: nad, alert and oriented HEENT: ncat, B SOM noted w/o erythema.  No active cold sores.  Lips normal inspection.  Nasal exam stuffy. NECK: supple w/o LA CV: rrr.  no murmur PULM: ctab, no inc wob ABD: soft, +bs EXT: no edema SKIN: Well-perfused.

## 2022-10-11 NOTE — Patient Instructions (Addendum)
Valtrex 500mg  daily.  Double dose if you have a flare.  Try that for 3 months, up to 6 months if needed.   Would avoid claritin D.  Ask Dr. about a beta blocker if your BP is staying up.  Or let me know about your BP.   Take care.  Glad to see you.

## 2022-10-13 DIAGNOSIS — Z8709 Personal history of other diseases of the respiratory system: Secondary | ICD-10-CM | POA: Insufficient documentation

## 2022-10-13 DIAGNOSIS — B001 Herpesviral vesicular dermatitis: Secondary | ICD-10-CM | POA: Insufficient documentation

## 2022-10-13 NOTE — Assessment & Plan Note (Signed)
Improved in the meantime. Would avoid claritin D.  Unclear how much work stressors, illness, medications contributed to blood pressure elevation.

## 2022-10-13 NOTE — Assessment & Plan Note (Signed)
Valtrex 500mg  daily.  Double dose if a flare.  Try that for 3 months, up to 6 months if needed.  Update me as needed.

## 2022-10-13 NOTE — Assessment & Plan Note (Signed)
I asked him to ask Dr. Elna Breslow about a beta blocker if his BP is staying up.  Otherwise he can let me know about his blood pressure.  Beta-blocker may help some with anxiety.  Discussed.

## 2022-10-17 ENCOUNTER — Other Ambulatory Visit: Payer: Self-pay | Admitting: Family Medicine

## 2022-10-17 MED ORDER — CETIRIZINE HCL 10 MG PO TABS: 10.0000 mg | ORAL_TABLET | Freq: Every day | ORAL | Status: AC

## 2022-10-19 ENCOUNTER — Telehealth (INDEPENDENT_AMBULATORY_CARE_PROVIDER_SITE_OTHER): Payer: BC Managed Care – PPO | Admitting: Psychiatry

## 2022-10-19 ENCOUNTER — Encounter: Payer: Self-pay | Admitting: Psychiatry

## 2022-10-19 DIAGNOSIS — F99 Mental disorder, not otherwise specified: Secondary | ICD-10-CM

## 2022-10-19 DIAGNOSIS — F411 Generalized anxiety disorder: Secondary | ICD-10-CM

## 2022-10-19 DIAGNOSIS — F5105 Insomnia due to other mental disorder: Secondary | ICD-10-CM

## 2022-10-19 DIAGNOSIS — F330 Major depressive disorder, recurrent, mild: Secondary | ICD-10-CM

## 2022-10-19 NOTE — Progress Notes (Signed)
Virtual Visit via Video Note  I connected with Brandon Jackson on 10/19/22 at  8:30 AM EST by a video enabled telemedicine application and verified that I am speaking with the correct person using two identifiers.  Location Provider Location : [facility] Patient Location : Work  Participants: Patient , Provider   I discussed the limitations of evaluation and management by telemedicine and the availability of in person appointments. The patient expressed understanding and agreed to proceed.  I discussed the assessment and treatment plan with the patient. The patient was provided an opportunity to ask questions and all were answered. The patient agreed with the plan and demonstrated an understanding of the instructions.   The patient was advised to call back or seek an in-person evaluation if the symptoms worsen or if the condition fails to improve as anticipated.    [facility] MD OP Progress Note  10/19/2022 1:07 PM Brandon Jackson  MRN:  [id number]  Chief Complaint:  Chief Complaint  Patient presents with   Follow-up   Medication Refill   Anxiety   Depression   HPI: Brandon Jackson is a 41 year old Caucasian male, currently employed, lives in [place], has a history of MDD, GAD, hyperlipidemia, insomnia was evaluated by telemedicine today.  Patient today reports he is currently recovering from an upper respiratory tract infection symptoms, has congestion, cold like symptoms.  Patient reports he is trying to attend a meeting at work and planning to go back home and get some rest afterwards.  Patient reports overall he has been feeling much better than before.  Denies any depression symptoms.  Although anxious it is manageable.  Reports sleep is restless at least a couple of times a week.  Reports he goes to bed at around 11 PM and couple of times a week he wakes up too early around 4 AM and is unable to fall back asleep.  He takes the Lunesta usually around bedtime.  Does not have  a good sleep hygiene, watches YouTube videos prior to falling asleep at times.  Agreeable to keep track of sleep hygiene and keep a sleep log.  Denies any suicidality, homicidality or perceptual disturbances.  Reports he is currently doing self-esteem worksheets at home.  That has definitely helped.  Continues to follow-up with his therapist Mr. Alene Mires.  Currently compliant on medications, denies side effects.  Denies any other concerns today.  Visit Diagnosis:    ICD-10-CM   1. MDD (major depressive disorder), recurrent episode, mild ([facility])  F33.0     2. GAD (generalized anxiety disorder)  F41.1     3. Insomnia due to other mental disorder  F51.05    F99    Anxiety, lack of sleep hygiene.      Past Psychiatric History: Reviewed past psychiatric history from progress note on 12/23/2021.  Past trials of Lexapro, Effexor, Wellbutrin, Lunesta, trazodone.  Past Medical History:  Past Medical History:  Diagnosis Date   Anxiety    Depression    Frequent headaches    Gout    History of chicken pox    Migraines    Ulcer     Past Surgical History:  Procedure Laterality Date   CYSTECTOMY  2003   TONSILLECTOMY AND ADENOIDECTOMY  2007   Wisdom Teeth Removal  1998    Family Psychiatric History: Reviewed family psychiatric history from progress note on 12/23/2021.  Family History:  Family History  Problem Relation Age of Onset   Depression Mother    Diabetes Father  Hyperlipidemia Father    Hypertension Father    Colon cancer Neg Hx    Prostate cancer Neg Hx     Social History: Reviewed social history from progress note on 12/23/2021. Social History   Socioeconomic History   Marital status: Significant Other    Spouse name: Not on file   Number of children: Not on file   Years of education: College   Highest education level: Not on file  Occupational History   Occupation: communications    Employer: [employer]  Tobacco Use   Smoking status: Former    Types:  Cigarettes    Quit date: 2013    Years since quitting: 10.9    Passive exposure: Past   Smokeless tobacco: Never  Vaping Use   Vaping Use: Never used  Substance and Sexual Activity   Alcohol use: Yes    Comment: occasional   Drug use: No   Sexual activity: Not on file  Other Topics Concern   Not on file  Social History Narrative   Single   Was in committed relationship 2007-2023.   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     Social Determinants of Health   Financial Resource Strain: Not on file  Food Insecurity: Not on file  Transportation Needs: Not on file  Physical Activity: Not on file  Stress: Not on file  Social Connections: Not on file    Allergies:  Allergies  Allergen Reactions   Wellbutrin [Bupropion] Other (See Comments)    Anxiety increased on med when taken alone.  He can tolerate wellbutrin when taken with lexparo.    Erythromycin Diarrhea and Rash    Metabolic Disorder Labs: No results found for: "HGBA1C", "MPG" No results found for: "PROLACTIN" Lab Results  Component Value Date   CHOL 154 05/17/2022   TRIG 111.0 05/17/2022   HDL 49.60 05/17/2022   CHOLHDL 3 05/17/2022   VLDL 22.2 05/17/2022   LDLCALC 82 05/17/2022   LDLCALC 82 10/12/2021   Lab Results  Component Value Date   TSH 1.24 05/17/2022   TSH 1.310 01/05/2022    Therapeutic Level Labs: No results found for: "LITHIUM" No results found for: "VALPROATE" No results found for: "CBMZ"  Current Medications: Current Outpatient Medications  Medication Sig Dispense Refill   allopurinol (ZYLOPRIM) 100 MG tablet Take 1 tablet (100 mg total) by mouth 2 (two) times daily. 180 tablet 3   Ascorbic Acid (VITAMIN C PO) Take by mouth.     B Complex Vitamins (VITAMIN B COMPLEX PO) Take by mouth.     buPROPion (WELLBUTRIN SR) 150 MG 12 hr tablet Take 1 tablet (150 mg total) by mouth 2 (two) times daily. 180 tablet 1   busPIRone (BUSPAR) 10 MG tablet Take 2  tablets by mouth twice daily. Dose increase. 120 tablet 1   cetirizine (ZYRTEC) 10 MG tablet Take 1 tablet (10 mg total) by mouth daily.     clonazePAM (KLONOPIN) 0.5 MG tablet 0.5--1 TAB TWICE A DAY IF NEEDED FOR ANXIETY CAN TAKE 0.5-1 TAB AT NIGHT FOR INSOMNIA. SEDATION CAUTION 20 tablet 1   colchicine 0.6 MG tablet 2 po at onset of pain, may repeat 1 tab in 1 hour if still hurting- no more then 3 tablets in 24 hours 12 tablet 1   emtricitabine-tenofovir (TRUVADA) 200-300 MG tablet TAKE 1 TABLET BY MOUTH EVERY DAY 90 tablet 1   eszopiclone (LUNESTA) 2 MG TABS tablet Take 0.5-1  tablets (1-2 mg total) by mouth at bedtime as needed for sleep. Take immediately before bedtime 30 tablet 2   fluticasone (FLONASE) 50 MCG/ACT nasal spray Place into both nostrils daily.     hydrOXYzine (VISTARIL) 25 MG capsule TAKE 1-3 CAPSULES (25-75 MG TOTAL) AT BEDTIME AS NEEDED FOR ANXIETY AND SLEEP. 270 capsule 0   Lysine 1000 MG TABS      Multiple Vitamin (MULTIVITAMIN) tablet Take 1 tablet by mouth daily.     valACYclovir (VALTREX) 500 MG tablet Take 1 tablet (500 mg total) by mouth daily. 90 tablet 1   No current facility-administered medications for this visit.     Musculoskeletal: Strength & Muscle Tone:  UTA Gait & Station:  Seated Patient leans:  NA  Psychiatric Specialty Exam: Review of Systems  HENT:  Positive for congestion.   Psychiatric/Behavioral:  Positive for sleep disturbance. The patient is nervous/anxious.   All other systems reviewed and are negative.   There were no vitals taken for this visit.There is no height or weight on file to calculate BMI.  General Appearance: Casual  Eye Contact:  Fair  Speech:  Clear and Coherent  Volume:  Normal  Mood:  Anxious  Affect:  Congruent  Thought Process:  Goal Directed and Descriptions of Associations: Intact  Orientation:  Full (Time, Place, and Person)  Thought Content: Logical   Suicidal Thoughts:  No  Homicidal Thoughts:  No  Memory:   Immediate;   Fair Recent;   Fair Remote;   Fair  Judgement:  Fair  Insight:  Fair  Psychomotor Activity:  Normal  Concentration:  Concentration: Fair and Attention Span: Fair  Recall:  [employer] of Knowledge: Fair  Language: Fair  Akathisia:  No  Handed:  Right  AIMS (if indicated): not done  Assets:  Communication Skills Desire for [place] Talents/Skills Transportation  ADL's:  Intact  Cognition: WNL  Sleep:   Restless at times   Screenings: [facility] Video Visit from 03/03/2022 in [facility] Total Score 0      GAD-7    Flowsheet Row Video Visit from 10/19/2022 in [facility] Office Visit from 09/03/2022 in [facility] Video Visit from 08/09/2022 in [facility] Video Visit from 05/31/2022 in [facility] Video Visit from 01/13/2022 in [facility]  Total GAD-7 Score 6 15 19 5 7       PHQ2-9    Flowsheet Row Video Visit from 10/19/2022 in [facility] Office Visit from 09/03/2022 in [facility] Video Visit from 08/09/2022 in [facility] Video Visit from 05/31/2022 in [facility] Video Visit from 03/03/2022 in [facility]  PHQ-2 Total Score 0 1 1 0 1  PHQ-9 Total Score -- 9 11 -- --      Flowsheet Row Video Visit from 10/19/2022 in [facility] Office Visit from 09/03/2022 in [facility] Video Visit from 08/09/2022 in [facility] No Risk Low Risk Low Risk        Assessment and Plan: Brandon Jackson is a 41 year old Caucasian male, employed, lives in [place], has a history of MDD, GAD, was evaluated by  telemedicine today.  Patient is currently improving although continues to have sleep issues.  Plan as noted below.  Plan MDD in remission Wellbutrin SR 150 mg  p.o. twice daily BuSpar 20 mg p.o. twice daily Continue CBT with Mr. [name].  GAD-improving BuSpar 20 mg p.o. twice daily Klonopin 0.5 mg as needed for severe anxiety attacks Reviewed [place] PMP AWARxE Hydroxyzine 25-75 mg at bedtime as needed Continue CBT  Insomnia-unstable Discussed sleep hygiene techniques Patient to use Lunesta 2 mg p.o. nightly as needed Hydroxyzine 25-75 mg at bedtime as needed Patient to keep a sleep log. Also could add medications like melatonin or relaxium over-the-counter.  Follow-up in clinic in 2 to 3 months or sooner if needed. Collaboration of Care: Collaboration of Care: Primary Care Provider AEB advised to follow up with primary care for upper respiratory tract infection symptoms and Referral or follow-up with counselor/therapist AEB encouraged to continue to follow-up with therapist.  Patient/Guardian was advised Release of Information must be obtained prior to any record release in order to collaborate their care with an outside provider. Patient/Guardian was advised if they have not already done so to contact the registration department to sign all necessary forms in order for [place] to release information regarding their care.   Consent: Patient/Guardian gives verbal consent for treatment and assignment of benefits for services provided during this visit. Patient/Guardian expressed understanding and agreed to proceed.   This note was generated in part or whole with voice recognition software. Voice recognition is usually quite accurate but there are transcription errors that can and very often do occur. I apologize for any typographical errors that were not detected and corrected.      Jomarie Longs, MD 10/19/2022, 1:07 PM

## 2022-10-27 ENCOUNTER — Ambulatory Visit (INDEPENDENT_AMBULATORY_CARE_PROVIDER_SITE_OTHER): Payer: BC Managed Care – PPO | Admitting: Psychology

## 2022-10-27 DIAGNOSIS — F330 Major depressive disorder, recurrent, mild: Secondary | ICD-10-CM | POA: Diagnosis not present

## 2022-10-27 DIAGNOSIS — F411 Generalized anxiety disorder: Secondary | ICD-10-CM

## 2022-10-27 NOTE — Progress Notes (Unsigned)
Comprehensive Clinical Assessment (CCA) Note  10/27/2022 Brandon Jackson [id number]  Time Spent: 12:32  pm - 1:32 pm: 60 Minutes  Chief Complaint: No chief complaint on file.  Visit Diagnosis: F33.0 &  F41.1  Guardian/Payee:  self    Paperwork requested: No   Reason for Visit /Presenting Problem: depression and anxiety.   Mental Status Exam: Appearance:   Well Groomed     Behavior:  Appropriate  Motor:  Normal  Speech/Language:   Clear and Coherent  Affect:  Congruent  Mood:  normal  Thought process:  normal  Thought content:    WNL  Sensory/Perceptual disturbances:    WNL  Orientation:  oriented to person, place, time/date, and situation  Attention:  Good  Concentration:  Good  Memory:  WNL  Fund of knowledge:   Good  Insight:    Good  Judgment:   Good  Impulse Control:  Good   Reported Symptoms:  depression and anxiety.  Risk Assessment: Danger to Self:  No Self-injurious Behavior: No Danger to Others: No Duty to Warn:no Physical Aggression / Violence:No  Access to Firearms a concern: No  Gang Involvement:No  Patient / guardian was educated about steps to take if suicide or homicide risk level increases between visits: no While future psychiatric events cannot be accurately predicted, the patient does not currently require acute inpatient psychiatric care and does not currently meet [facility] involuntary commitment criteria.  In case of a mental health emergency:  19 - confidential suicide hotline. [facility]):        [name], [place] [address]       [phone] 3.   911  4.   Visiting Nearest ED.    Substance Abuse History: Current substance abuse: No    Caffeine: 1x-2x. Tobacco: denied.  Substance use: denied.  Alcohol: socially/occasionally.   Past Psychiatric History:   Previous psychological history is significant for anxiety and depression Outpatient Providers:Kiko Ripp,  LCSW. Dr. Shea Evans ([facility]) History of Psych Hospitalization: No  Psychological Testing:  NA    Abuse History:  Victim of: No.,  na    Report needed: No. Victim of Neglect:No. Perpetrator of  na   Witness / Exposure to Domestic Violence: No   Protective Services Involvement: No  Witness to [employer] Violence:  No   Family History:  Family History  Problem Relation Age of Onset   Depression Mother    Diabetes Father    Hyperlipidemia Father    Hypertension Father    Colon cancer Neg Hx    Prostate cancer Neg Hx     Living situation: the patient lives alone  Sexual Orientation: Gay  Relationship Status: separated  Name of spouse / other: [name] (separated). If a parent, number of children / ages:0  Support Systems: Brother (chris), Mickel Baas (friend).    Financial Stress:  No   Income/Employment/Disability: Employment: [employer].   Military Service: No   Educational History: Education: college graduate  Religion/Sprituality/World View: na  Any cultural differences that may affect / interfere with treatment:  not applicable   Recreation/Hobbies: Gardening, playing guitar, video-games.   Stressors: Other: work, Animal nutritionist", finances.     Strengths: Supportive Relationships, Family, Friends, Hopefulness, Conservator, museum/gallery, and Able to Communicate Effectively  Barriers:  Mood.    Legal History: Pending legal issue / charges: The patient has no significant history of legal issues. History of legal issue / charges:  na  Medical History/Surgical History: reviewed  Past Medical History:  Diagnosis Date   Anxiety    Depression    Frequent headaches    Gout    History of chicken pox    Migraines    Ulcer     Past Surgical History:  Procedure Laterality Date   CYSTECTOMY  2003   TONSILLECTOMY AND ADENOIDECTOMY  2007   Wisdom Teeth Removal  1998    Medications: Current Outpatient Medications  Medication Sig Dispense Refill   allopurinol (ZYLOPRIM) 100  MG tablet Take 1 tablet (100 mg total) by mouth 2 (two) times daily. 180 tablet 3   Ascorbic Acid (VITAMIN C PO) Take by mouth.     B Complex Vitamins (VITAMIN B COMPLEX PO) Take by mouth.     buPROPion (WELLBUTRIN SR) 150 MG 12 hr tablet Take 1 tablet (150 mg total) by mouth 2 (two) times daily. 180 tablet 1   busPIRone (BUSPAR) 10 MG tablet Take 2 tablets by mouth twice daily. Dose increase. 120 tablet 1   cetirizine (ZYRTEC) 10 MG tablet Take 1 tablet (10 mg total) by mouth daily.     clonazePAM (KLONOPIN) 0.5 MG tablet 0.5--1 TAB TWICE A DAY IF NEEDED FOR ANXIETY CAN TAKE 0.5-1 TAB AT NIGHT FOR INSOMNIA. SEDATION CAUTION 20 tablet 1   colchicine 0.6 MG tablet 2 po at onset of pain, may repeat 1 tab in 1 hour if still hurting- no more then 3 tablets in 24 hours 12 tablet 1   emtricitabine-tenofovir (TRUVADA) 200-300 MG tablet TAKE 1 TABLET BY MOUTH EVERY DAY 90 tablet 1   eszopiclone (LUNESTA) 2 MG TABS tablet Take 0.5-1 tablets (1-2 mg total) by mouth at bedtime as needed for sleep. Take immediately before bedtime 30 tablet 2   fluticasone (FLONASE) 50 MCG/ACT nasal spray Place into both nostrils daily.     hydrOXYzine (VISTARIL) 25 MG capsule TAKE 1-3 CAPSULES (25-75 MG TOTAL) AT BEDTIME AS NEEDED FOR ANXIETY AND SLEEP. 270 capsule 0   Lysine 1000 MG TABS      Multiple Vitamin (MULTIVITAMIN) tablet Take 1 tablet by mouth daily.     valACYclovir (VALTREX) 500 MG tablet Take 1 tablet (500 mg total) by mouth daily. 90 tablet 1   No current facility-administered medications for this visit.    Allergies  Allergen Reactions   Wellbutrin [Bupropion] Other (See Comments)    Anxiety increased on med when taken alone.  He can tolerate wellbutrin when taken with lexparo.    Erythromycin Diarrhea and Rash    Diagnoses:  MDD (major depressive disorder), recurrent episode, mild (HCC)  GAD (generalized anxiety disorder)  Psychiatric Treatment: Yes , Dr. Shea Evans ([facility]). See chart.   Plan of  Care: Continued counseling and medication management.   Narrative:   [name] participated from office, via video, and consented to treatment. Therapist participated from home office. We met online due to [place] pandemic.We reviewed the limits of confidentiality prior to the start of the evaluation and Neema expressed understanding and agreement to proceed. Krishang is a current patient completing his annual reevaluation. We completed the GAD-7 and PHQ-9 during the evaluation. Please see below for screening results. Myshawn noted his current stressors including work related stressors (overload in work duties, lack of empathy and understanding), related financial issues (living alone & 100% responsible), relationship issues (monogony, needs, intimacy difficulties), and recent life transitions. He noted consideration of a job change and this next month will be a determining factor in this decision. He noted difficulty with adjusting  to his separation (feelings of guilt) and the numerous adjustments that are intrinsic to living alone. He noted a rise in his health related anxiety. He noted getting sick more often this fall and noted this affecting his ability to engage socially. He noted a 50# weight-loss since August 2022. He noted this being due to mood and appetite but discussed his appetite improving more recently. He noted a recent hypertension issues that he identified as due to decongestants. He noted continued struggles managing his stress and negative self-talk. He noted his sleep being poor, primarily middle insomnia, and noted waking between 3 am - 5 am. He noted the possibility of phone usage playing a part of his sleep difficulty. He continues to take his medication consistently and continues to follow-up with his prescriber, Dr. Shea Evans, for psychiatric treatment. We reviewed safety due to past history concerns. Tripton denied any recent history of SI and has a Chief Strategy Officer on file. Ebrima  was agreeable to this plan and has contacted therapist, in the past, for ad-hoc sessions.  Chares is intelligent, forthcoming, and motivated for change. We scheduled follow-up for continued treatment.        10/27/2022   12:43 PM 10/19/2022    8:43 AM 09/03/2022   10:07 AM  Depression screen PHQ 2/9  Decreased Interest 0    Down, Depressed, Hopeless 0    PHQ - 2 Score 0    Altered sleeping 3    Tired, decreased energy 1    Change in appetite 1    Feeling bad or failure about yourself  1    Trouble concentrating 1    Moving slowly or fidgety/restless 1    Suicidal thoughts 0    PHQ-9 Score 8       Information is confidential and restricted. Go to Review Flowsheets to unlock data.       10/27/2022   12:41 PM 10/19/2022    8:42 AM 09/03/2022   10:08 AM 08/09/2022    1:50 PM  GAD 7 : Generalized Anxiety Score  Nervous, Anxious, on Edge 1     Control/stop worrying 1     Worry too much - different things 1     Trouble relaxing 1     Restless 1     Easily annoyed or irritable 0     Afraid - awful might happen 1     Total GAD 7 Score 6     Anxiety Difficulty Somewhat difficult        Information is confidential and restricted. Go to Review Flowsheets to unlock data.       [name], LCSW

## 2022-11-11 ENCOUNTER — Encounter: Payer: Self-pay | Admitting: Family Medicine

## 2022-11-11 MED ORDER — ALLOPURINOL 100 MG PO TABS: 100.0000 mg | ORAL_TABLET | Freq: Two times a day (BID) | ORAL | 3 refills | Status: DC

## 2022-11-14 ENCOUNTER — Other Ambulatory Visit: Payer: Self-pay | Admitting: Psychiatry

## 2022-11-14 DIAGNOSIS — F411 Generalized anxiety disorder: Secondary | ICD-10-CM

## 2022-11-14 DIAGNOSIS — F330 Major depressive disorder, recurrent, mild: Secondary | ICD-10-CM

## 2022-11-23 ENCOUNTER — Other Ambulatory Visit: Payer: Self-pay | Admitting: Family Medicine

## 2022-11-24 NOTE — Telephone Encounter (Signed)
Refill request for CLONAZEPAM 0.5 MG TABLET   LOV - 10/11/22 Next OV - not scheduled Last refill - 05/17/22 #20/1

## 2022-11-30 DIAGNOSIS — M9901 Segmental and somatic dysfunction of cervical region: Secondary | ICD-10-CM | POA: Diagnosis not present

## 2022-11-30 DIAGNOSIS — M9906 Segmental and somatic dysfunction of lower extremity: Secondary | ICD-10-CM | POA: Diagnosis not present

## 2022-11-30 DIAGNOSIS — M9902 Segmental and somatic dysfunction of thoracic region: Secondary | ICD-10-CM | POA: Diagnosis not present

## 2022-11-30 DIAGNOSIS — M9903 Segmental and somatic dysfunction of lumbar region: Secondary | ICD-10-CM | POA: Diagnosis not present

## 2022-12-06 ENCOUNTER — Ambulatory Visit: Payer: BC Managed Care – PPO | Admitting: Psychology

## 2022-12-06 DIAGNOSIS — F411 Generalized anxiety disorder: Secondary | ICD-10-CM

## 2022-12-06 DIAGNOSIS — F330 Major depressive disorder, recurrent, mild: Secondary | ICD-10-CM

## 2022-12-06 NOTE — Progress Notes (Signed)
[facility] Counselor/Therapist Progress Note  Patient ID: RAPHEL STICKLES, MRN: [id number]   Date: 12/06/22  Time Spent: 5:06  pm - 6:01 pm : 55 Minutes  Treatment Type: Individual Therapy.  Reported Symptoms: Depression and Anxiety  Mental Status Exam: Appearance:  Neat and Well Groomed     Behavior: Appropriate  Motor: Normal  Speech/Language:  Clear and Coherent  Affect: Flat  Mood: dysthymic  Thought process: normal  Thought content:   WNL  Sensory/Perceptual disturbances:   WNL  Orientation: oriented to person, place, time/date, and situation  Attention: Good  Concentration: Good  Memory: WNL  Fund of knowledge:  Good  Insight:   Good  Judgment:  Good  Impulse Control: Good   Risk Assessment: Danger to Self:  No Self-injurious Behavior: No Danger to Others: No Duty to Warn:no Physical Aggression / Violence:No  Access to Firearms a concern: No  Gang Involvement:No   Subjective:   [name] participated from office, via video and consented to treatment. Therapist participated from home office. We met online due to [place] pandemic. Altin reviewed the events of the past week. We reviewed numerous treatment approaches including CBT, BA, Problem Solving, and Solution focused therapy. Psych-education regarding the Haidar's diagnosis of MDD (major depressive disorder), recurrent episode, mild (HCC)  GAD (generalized anxiety disorder) was provided during the session. We discussed Bailey Kolbe Jernberg's goals treatment include manage overall symptoms, maintain work/home life balance, bolster coping skills, improve self-esteem, improve boundary setting, and increase mindfulness. He noted too much of his identify  wrapped up in work. Work in relation to core beliefs would also be beneficial.    [name] provided verbal approval of the treatment plan.   Interventions: Psycho-education & Goal Setting.   Diagnosis:  MDD (major  depressive disorder), recurrent episode, mild (HCC)  GAD (generalized anxiety disorder)  Psychiatric Treatment: Yes , Dr. Shea Evans. See chart for details.    Treatment Plan:  Client Abilities/Strengths Taym is intelligent, forthcoming, and motivated for change.   Support System: Family and friends.   Client Treatment Preferences Outpatient Therapy.   Client Statement of Needs Leevon would like to  manage overall symptoms, maintain work/home life balance, bolster coping skills, improve self-esteem, improve boundary setting, and increase mindfulness. He noted too much of his identify  wrapped up in work. Work in relation to core beliefs would also be beneficial.    Treatment Level Weekly  Symptoms  Depression: poor sleep, change in appetite, feeling bad about self, poor concentration, psycho-motor retardation.   (Status: maintained) Anxiety: Feeling anxious, difficulty managing worry, worrying about different things, restlessness, irritability, and feeling afraid something awful might happen.    (Status: maintained)  Goals:   Ricco experiences symptoms of depression and anxiety.   Target Date: 12/07/23 Frequency: Weekly  Progress: 0 Modality: individual    Therapist will provide referrals for additional resources as appropriate.  Therapist will provide psycho-education regarding Malique's diagnosis and corresponding treatment approaches and interventions. Licensed Clinical Social Worker, [place], LCSW will support the patient's ability to achieve the goals identified. will employ CBT, BA, Problem-solving, Solution Focused, Mindfulness,  coping skills, & other evidenced-based practices will be used to promote progress towards healthy functioning to help manage decrease symptoms associated with his diagnosis.   Reduce overall level, frequency, and intensity of the feelings of depression, anxiety and panic evidenced by decreased overall symptoms from 6 to 7 days/week to  0 to 1 days/week per client report for at least 3  consecutive months. Verbally express understanding of the relationship between feelings of depression, anxiety and their impact on thinking patterns and behaviors. Verbalize an understanding of the role that distorted thinking plays in creating fears, excessive worry, and ruminations.    [name] participated in the creation of the treatment plan)    [name], LCSW

## 2022-12-08 DIAGNOSIS — M9901 Segmental and somatic dysfunction of cervical region: Secondary | ICD-10-CM | POA: Diagnosis not present

## 2022-12-08 DIAGNOSIS — M9906 Segmental and somatic dysfunction of lower extremity: Secondary | ICD-10-CM | POA: Diagnosis not present

## 2022-12-08 DIAGNOSIS — M9902 Segmental and somatic dysfunction of thoracic region: Secondary | ICD-10-CM | POA: Diagnosis not present

## 2022-12-08 DIAGNOSIS — M9903 Segmental and somatic dysfunction of lumbar region: Secondary | ICD-10-CM | POA: Diagnosis not present

## 2022-12-10 ENCOUNTER — Other Ambulatory Visit: Payer: Self-pay | Admitting: Psychiatry

## 2022-12-10 DIAGNOSIS — F5105 Insomnia due to other mental disorder: Secondary | ICD-10-CM

## 2022-12-20 DIAGNOSIS — M9901 Segmental and somatic dysfunction of cervical region: Secondary | ICD-10-CM | POA: Diagnosis not present

## 2022-12-20 DIAGNOSIS — M9902 Segmental and somatic dysfunction of thoracic region: Secondary | ICD-10-CM | POA: Diagnosis not present

## 2022-12-20 DIAGNOSIS — M9906 Segmental and somatic dysfunction of lower extremity: Secondary | ICD-10-CM | POA: Diagnosis not present

## 2022-12-20 DIAGNOSIS — M9903 Segmental and somatic dysfunction of lumbar region: Secondary | ICD-10-CM | POA: Diagnosis not present

## 2022-12-24 ENCOUNTER — Other Ambulatory Visit: Payer: Self-pay | Admitting: Psychiatry

## 2022-12-24 ENCOUNTER — Other Ambulatory Visit: Payer: Self-pay | Admitting: Family Medicine

## 2022-12-24 DIAGNOSIS — F330 Major depressive disorder, recurrent, mild: Secondary | ICD-10-CM

## 2022-12-24 DIAGNOSIS — F411 Generalized anxiety disorder: Secondary | ICD-10-CM

## 2023-01-03 ENCOUNTER — Ambulatory Visit: Payer: BC Managed Care – PPO | Admitting: Psychology

## 2023-01-03 DIAGNOSIS — F330 Major depressive disorder, recurrent, mild: Secondary | ICD-10-CM

## 2023-01-03 DIAGNOSIS — F411 Generalized anxiety disorder: Secondary | ICD-10-CM | POA: Diagnosis not present

## 2023-01-03 NOTE — Progress Notes (Signed)
[facility] Counselor/Therapist Progress Note  Patient ID: Brandon Jackson, MRN: [id number]   Date: 01/03/23  Time Spent: 4:03  pm - 4:59 pm : 56 Minutes  Treatment Type: Individual Therapy.  Reported Symptoms: Depression and Anxiety  Mental Status Exam: Appearance:  Neat and Well Groomed     Behavior: Appropriate  Motor: Normal  Speech/Language:  Clear and Coherent  Affect: Flat  Mood: dysthymic  Thought process: normal  Thought content:   WNL  Sensory/Perceptual disturbances:   WNL  Orientation: oriented to person, place, time/date, and situation  Attention: Good  Concentration: Good  Memory: WNL  Fund of knowledge:  Good  Insight:   Good  Judgment:  Good  Impulse Control: Good   Risk Assessment: Danger to Self:  No Self-injurious Behavior: No Danger to Others: No Duty to Warn:no Physical Aggression / Violence:No  Access to Firearms a concern: No  Gang Involvement:No   Subjective:   Brandon Jackson participated from office, via video and consented to treatment. Therapist participated from home office. We met online due to Brandon Jackson pandemic. Brandon Jackson reviewed the events of the past week. Brandon Jackson needs his attempts to employ REST distress tolerance tool. He noted this being successful at times when he is mindfulness of mood. He noted having a recent employee evaluation and noted the feedback being "tone deaf". We processed this during the session. He noted working on setting boundaries for self and others, being more assertive, and giving feedback. He noted needing to meet with his supervisor regarding expectations, lack of understanding,and lack of changes to his responsibilities. We explored this and ways to communicate this positively and assertively. He noted the effect of this work stress on his overall mood, outlook, and functioning. He noted poorly coping with these stressors and noted his commitment to cope more positively. Therapist validated and  normalized Brandon Jackson's feelings . Brandon Jackson denied any SI and has a safety plan in place. Therapist encouraged Brandon Jackson to engage in more mindfulness and boundary setting for self regarding rumination and recommended the [facility]'s mindfulness app. Brandon Jackson was engaged and motivated during the session.     Brandon Jackson provided verbal approval of the treatment plan.   Interventions: CBT & Interpersonal  Diagnosis:  MDD (major depressive disorder), recurrent episode, mild (HCC)  GAD (generalized anxiety disorder)  Psychiatric Treatment: Yes , Dr. Shea Evans. See chart for details.    Treatment Plan:  Client Abilities/Strengths Brandon Jackson is intelligent, forthcoming, and motivated for change.   Support System: Family and friends.   Client Treatment Preferences Outpatient Therapy.   Client Statement of Needs Brandon Jackson would like to  manage overall symptoms, maintain work/home life balance, bolster coping skills, improve self-esteem, improve boundary setting, and increase mindfulness. He noted too much of his identify  wrapped up in work. Work in relation to core beliefs would also be beneficial.    Treatment Level Weekly  Symptoms  Depression: poor sleep, change in appetite, feeling bad about self, poor concentration, psycho-motor retardation.   (Status: maintained) Anxiety: Feeling anxious, difficulty managing worry, worrying about different things, restlessness, irritability, and feeling afraid something awful might happen.    (Status: maintained)  Goals:   Brandon Jackson experiences symptoms of depression and anxiety.   Target Date: 12/07/23 Frequency: Weekly  Progress: 0 Modality: individual    Therapist will provide referrals for additional resources as appropriate.  Therapist will provide psycho-education regarding Brandon Jackson's diagnosis and corresponding treatment approaches and interventions. Licensed Clinical Social Worker, [name], LCSW will support the  patient's ability to  achieve the goals identified. will employ CBT, BA, Problem-solving, Solution Focused, Mindfulness,  coping skills, & other evidenced-based practices will be used to promote progress towards healthy functioning to help manage decrease symptoms associated with his diagnosis.   Reduce overall level, frequency, and intensity of the feelings of depression, anxiety and panic evidenced by decreased overall symptoms from 6 to 7 days/week to 0 to 1 days/week per client report for at least 3 consecutive months. Verbally express understanding of the relationship between feelings of depression, anxiety and their impact on thinking patterns and behaviors. Verbalize an understanding of the role that distorted thinking plays in creating fears, excessive worry, and ruminations.    Brandon Jackson participated in the creation of the treatment plan)    [name], LCSW

## 2023-01-04 ENCOUNTER — Ambulatory Visit (INDEPENDENT_AMBULATORY_CARE_PROVIDER_SITE_OTHER): Payer: Self-pay | Admitting: Physician Assistant

## 2023-01-04 ENCOUNTER — Encounter: Payer: Self-pay | Admitting: Physician Assistant

## 2023-01-04 VITALS — BP 120/78 | HR 65 | Temp 98.9°F | Wt 198.4 lb

## 2023-01-04 DIAGNOSIS — U071 COVID-19: Secondary | ICD-10-CM

## 2023-01-04 DIAGNOSIS — J029 Acute pharyngitis, unspecified: Secondary | ICD-10-CM

## 2023-01-04 LAB — POC COVID19 BINAXNOW: SARS Coronavirus 2 Ag: POSITIVE — AB

## 2023-01-04 NOTE — Progress Notes (Signed)
[facility] [address], [place] [address]   Office Visit Note  Patient Name: Brandon Jackson Date of Birth [id number]  Medical Record number [id number]  Date of Service: 01/04/2023  Chief Complaint  Patient presents with   Covid Exposure    Was exposed over the weekend. Woke up this morning with scratchy throat. No fever or BA. Getting over a cold from last week and still have drainage.      42 y/o M presents to the clinic for c/o sore throat and exposure to Covid positive relative. Pt was at a family gathering over the weekend and was told earlier this week that one of the family members tested positive for covid. Pt notice sore throat earlier today. No other symptoms. Able to swallow without pain.       Current Medication:  Outpatient Encounter Medications as of 01/04/2023  Medication Sig   allopurinol (ZYLOPRIM) 100 MG tablet Take 1 tablet (100 mg total) by mouth 2 (two) times daily.   Ascorbic Acid (VITAMIN C PO) Take by mouth.   B Complex Vitamins (VITAMIN B COMPLEX PO) Take by mouth.   buPROPion (WELLBUTRIN SR) 150 MG 12 hr tablet Take 1 tablet by mouth twice daily.   busPIRone (BUSPAR) 10 MG tablet Take 2 tablets by mouth twice daily. Dose increase.   cetirizine (ZYRTEC) 10 MG tablet Take 1 tablet (10 mg total) by mouth daily.   clonazePAM (KLONOPIN) 0.5 MG tablet 1/2-1 TAB BY MOUTH 2X/DAY IF NEEDED FOR ANXIETY, 1/2-1 TAB AT NIGHT OK IF INSOMNIA, CAUTION SEDATION   emtricitabine-tenofovir (TRUVADA) 200-300 MG tablet TAKE 1 TABLET BY MOUTH EVERY DAY   eszopiclone (LUNESTA) 2 MG TABS tablet TAKE 1/2 TO 1 TABLET (1-2 MG TOTAL) IMMEDIATELY BEFORE BEDTIME AS NEEDED FOR SLEEP.   fluticasone (FLONASE) 50 MCG/ACT nasal spray Place into both nostrils daily.   Lysine 1000 MG TABS    Multiple Vitamin (MULTIVITAMIN) tablet Take 1 tablet by mouth daily.   valACYclovir (VALTREX) 500 MG tablet Take 1 tablet (500 mg total) by mouth daily.   colchicine 0.6 MG tablet  2 po at onset of pain, may repeat 1 tab in 1 hour if still hurting- no more then 3 tablets in 24 hours (Patient not taking: Reported on 01/04/2023)   hydrOXYzine (VISTARIL) 25 MG capsule TAKE 1-3 CAPSULES (25-75 MG TOTAL) AT BEDTIME AS NEEDED FOR ANXIETY AND SLEEP.   No facility-administered encounter medications on file as of 01/04/2023.      Medical History: Past Medical History:  Diagnosis Date   Anxiety    Depression    Frequent headaches    Gout    History of chicken pox    Migraines    Ulcer      Vital Signs: BP 120/78 (BP Location: Left Arm, Patient Position: Sitting, Cuff Size: Normal)   Pulse 65   Temp 98.9 F (37.2 C) (Tympanic)   Wt 198 lb 6.4 oz (90 kg)   SpO2 97%   BMI 31.07 kg/m    Review of Systems  Constitutional: Negative.   HENT:  Positive for congestion and sore throat. Negative for sinus pressure, sinus pain and trouble swallowing.   Respiratory: Negative.    Cardiovascular: Negative.   Neurological: Negative.     Physical Exam Constitutional:      Appearance: Normal appearance.  HENT:     Head: Atraumatic.     Comments: +air-fluid level present in both ears    Right Ear: Tympanic membrane,  ear canal and external ear normal.     Left Ear: Tympanic membrane, ear canal and external ear normal.     Nose: Nose normal.     Mouth/Throat:     Mouth: Mucous membranes are moist.     Pharynx: Oropharynx is clear.  Eyes:     Extraocular Movements: Extraocular movements intact.  Cardiovascular:     Rate and Rhythm: Normal rate and regular rhythm.  Pulmonary:     Effort: Pulmonary effort is normal.     Breath sounds: Normal breath sounds.  Musculoskeletal:     Cervical back: Neck supple.  Skin:    General: Skin is warm.  Neurological:     Mental Status: He is alert.  Psychiatric:        Mood and Affect: Mood normal.        Behavior: Behavior normal.        Thought Content: Thought content normal.        Judgment: Judgment normal.        Assessment/Plan:  1. COVID-19 virus infection  2. Sore throat - POC COVID-19 BinaxNow  Reviewed POSITIVE covid 19 test result with patient. Even though the test result is a faint visible line, will treat it as a + result. Reviewed CDC guidelines with patient. Pt will be isolated for 5 days from the start of his symptoms. Needs to be fever free for 24 hours without the aid of medicine and must wear a mask for an additional 5 days before .   Continue with nasal spray, oral anti-histamine. Consider Neti pot for congestion. Continue to watch for worsening symptoms. RTC prn Pt verbalized understanding and in agreement.   General Counseling: larnce swecker understanding of the findings of todays visit and agrees with plan of treatment. I have discussed any further diagnostic evaluation that may be needed or ordered today. We also reviewed his medications today. he has been encouraged to call the office with any questions or concerns that should arise related to todays visit.    Time spent:20 [place], [place] Physician Assistant

## 2023-01-06 DIAGNOSIS — J069 Acute upper respiratory infection, unspecified: Secondary | ICD-10-CM | POA: Diagnosis not present

## 2023-01-06 DIAGNOSIS — U071 COVID-19: Secondary | ICD-10-CM | POA: Diagnosis not present

## 2023-01-18 ENCOUNTER — Ambulatory Visit: Payer: BC Managed Care – PPO | Admitting: Psychiatry

## 2023-01-18 ENCOUNTER — Encounter: Payer: Self-pay | Admitting: Psychiatry

## 2023-01-18 VITALS — BP 124/78 | HR 84 | Temp 97.8°F | Ht 67.0 in | Wt 195.4 lb

## 2023-01-18 DIAGNOSIS — F99 Mental disorder, not otherwise specified: Secondary | ICD-10-CM

## 2023-01-18 DIAGNOSIS — F5105 Insomnia due to other mental disorder: Secondary | ICD-10-CM

## 2023-01-18 DIAGNOSIS — F411 Generalized anxiety disorder: Secondary | ICD-10-CM | POA: Diagnosis not present

## 2023-01-18 DIAGNOSIS — F330 Major depressive disorder, recurrent, mild: Secondary | ICD-10-CM | POA: Diagnosis not present

## 2023-01-18 MED ORDER — BUSPIRONE HCL 10 MG PO TABS: 20.0000 mg | ORAL_TABLET | Freq: Three times a day (TID) | ORAL | 1 refills | Status: DC

## 2023-01-18 MED ORDER — BUPROPION HCL ER (SR) 100 MG PO TB12: 100.0000 mg | ORAL_TABLET | Freq: Two times a day (BID) | ORAL | 1 refills | Status: DC

## 2023-01-18 NOTE — Progress Notes (Unsigned)
[facility] MD OP Progress Note  01/18/2023 2:54 PM Brandon Jackson  MRN:  [id number]  Chief Complaint:  Chief Complaint  Patient presents with   Follow-up   Anxiety   Depression   Insomnia   Medication Refill   HPI: Brandon Jackson is a 42 year old Caucasian male, currently employed, lives in [place], has a history of MDD, GAD, hyperlipidemia, insomnia was evaluated in office today.  Patient today reports he tested positive for COVID-19 few weeks ago, currently recovered.  Currently he is back at work.  Reports continues to struggle with a lot of anxiety especially today is not a good day.  Multiple situational stressors including work-related stress, relationship stress.  Patient reports situation at work can get hectic several times a week.  He has been dealing with this since the past several years.  Patient reports he is looking into other job options however he has not been able to find anything yet.  He is currently in a new relationship, reports this is a friend whom he had known for the past 4 years.  Patient reports he often feels they are very different in their perceptions of things.  They do communicate and that is a positive factor in the relationship.  Patient reports they had an argument last night which also makes him anxious.  Patient continues to struggle with sleep.  There are nights when he wakes up at 3 or 4 AM with racing thoughts about his work-related stresses.  Does take the [facility] frequently.  Does have hydroxyzine as needed available.  Patient is currently compliant on the BuSpar and Wellbutrin.  He wonders whether Wellbutrin is making his anxiety worse.  Agreeable to dosage reduction.  Reports there are times when he feels ' what is the point of being here', however denies any active suicidal thoughts or plans.  Denies any homicidality or perceptual disturbances.  Continues to follow-up with therapist Mr. [name].  Patient denies any other concerns  today.  Visit Diagnosis:    ICD-10-CM   1. MDD (major depressive disorder), recurrent episode, mild (HCC)  F33.0 busPIRone (BUSPAR) 10 MG tablet    buPROPion ER (WELLBUTRIN SR) 100 MG 12 hr tablet    2. GAD (generalized anxiety disorder)  F41.1 busPIRone (BUSPAR) 10 MG tablet    3. Insomnia due to other mental disorder  F51.05    F99    Anxiety      Past Psychiatric History: Reviewed past psychiatric history from progress note on 12/23/2021.  Past trials of Lexapro, Effexor, Wellbutrin, Lunesta, trazodone  Past Medical History:  Past Medical History:  Diagnosis Date   Anxiety    Depression    Frequent headaches    Gout    History of chicken pox    Migraines    Ulcer     Past Surgical History:  Procedure Laterality Date   CYSTECTOMY  2003   TONSILLECTOMY AND ADENOIDECTOMY  2007   Wisdom Teeth Removal  1998    Family Psychiatric History: Reviewed family psychiatric history from progress note on 12/23/2021.  Family History:  Family History  Problem Relation Age of Onset   Depression Mother    Diabetes Father    Hyperlipidemia Father    Hypertension Father    Colon cancer Neg Hx    Prostate cancer Neg Hx     Social History: Reviewed social history from progress note on 12/23/2021. Social History   Socioeconomic History   Marital status: Significant Other    Spouse  name: Not on file   Number of children: Not on file   Years of education: College   Highest education level: Not on file  Occupational History   Occupation: communications    Employer: [employer]  Tobacco Use   Smoking status: Former    Types: Cigarettes    Quit date: 2013    Years since quitting: 11.2    Passive exposure: Past   Smokeless tobacco: Never  Vaping Use   Vaping Use: Never used  Substance and Sexual Activity   Alcohol use: Yes    Comment: occasional   Drug use: No   Sexual activity: Not on file  Other Topics Concern   Not on file  Social History Narrative   Single    Was in committed relationship 2007-2023.   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     Social Determinants of Health   Financial Resource Strain: Not on file  Food Insecurity: Not on file  Transportation Needs: Not on file  Physical Activity: Not on file  Stress: Not on file  Social Connections: Not on file    Allergies:  Allergies  Allergen Reactions   Wellbutrin [Bupropion] Other (See Comments)    Anxiety increased on med when taken alone.  He can tolerate wellbutrin when taken with lexparo.    Erythromycin Diarrhea and Rash    Metabolic Disorder Labs: No results found for: "HGBA1C", "MPG" No results found for: "PROLACTIN" Lab Results  Component Value Date   CHOL 154 05/17/2022   TRIG 111.0 05/17/2022   HDL 49.60 05/17/2022   CHOLHDL 3 05/17/2022   VLDL 22.2 05/17/2022   LDLCALC 82 05/17/2022   LDLCALC 82 10/12/2021   Lab Results  Component Value Date   TSH 1.24 05/17/2022   TSH 1.310 01/05/2022    Therapeutic Level Labs: No results found for: "LITHIUM" No results found for: "VALPROATE" No results found for: "CBMZ"  Current Medications: Current Outpatient Medications  Medication Sig Dispense Refill   allopurinol (ZYLOPRIM) 100 MG tablet Take 1 tablet (100 mg total) by mouth 2 (two) times daily. 180 tablet 3   Ascorbic Acid (VITAMIN C PO) Take by mouth.     B Complex Vitamins (VITAMIN B COMPLEX PO) Take by mouth.     buPROPion ER (WELLBUTRIN SR) 100 MG 12 hr tablet Take 1 tablet (100 mg total) by mouth 2 (two) times daily. Take at 8 AM and 2 PM 60 tablet 1   cetirizine (ZYRTEC) 10 MG tablet Take 1 tablet (10 mg total) by mouth daily.     clonazePAM (KLONOPIN) 0.5 MG tablet 1/2-1 TAB BY MOUTH 2X/DAY IF NEEDED FOR ANXIETY, 1/2-1 TAB AT NIGHT OK IF INSOMNIA, CAUTION SEDATION 20 tablet 1   emtricitabine-tenofovir (TRUVADA) 200-300 MG tablet TAKE 1 TABLET BY MOUTH EVERY DAY 90 tablet 1   eszopiclone (LUNESTA) 2 MG TABS tablet  TAKE 1/2 TO 1 TABLET (1-2 MG TOTAL) IMMEDIATELY BEFORE BEDTIME AS NEEDED FOR SLEEP. 30 tablet 1   fluticasone (FLONASE) 50 MCG/ACT nasal spray Place into both nostrils daily.     hydrOXYzine (VISTARIL) 25 MG capsule TAKE 1-3 CAPSULES (25-75 MG TOTAL) AT BEDTIME AS NEEDED FOR ANXIETY AND SLEEP. 270 capsule 0   Lysine 1000 MG TABS      Multiple Vitamin (MULTIVITAMIN) tablet Take 1 tablet by mouth daily.     valACYclovir (VALTREX) 500 MG tablet Take 1 tablet (500 mg total) by mouth daily. 90 tablet  1   busPIRone (BUSPAR) 10 MG tablet Take 2 tablets (20 mg total) by mouth 3 (three) times daily. 180 tablet 1   No current facility-administered medications for this visit.     Musculoskeletal: Strength & Muscle Tone: within normal limits Gait & Station: normal Patient leans: N/A  Psychiatric Specialty Exam: Review of Systems  Psychiatric/Behavioral:  Positive for dysphoric mood and sleep disturbance. The patient is nervous/anxious.   All other systems reviewed and are negative.   Blood pressure 124/78, pulse 84, temperature 97.8 F (36.6 C), temperature source Skin, height '5\' 7"'$  (1.702 m), weight 195 lb 6.4 oz (88.6 kg).Body mass index is 30.6 kg/m.  General Appearance: Casual  Eye Contact:  Fair  Speech:  Normal Rate  Volume:  Normal  Mood:  Anxious and Depressed  Affect:  Congruent  Thought Process:  Goal Directed and Descriptions of Associations: Intact  Orientation:  Full (Time, Place, and Person)  Thought Content: Logical   Suicidal Thoughts:  No  Homicidal Thoughts:  No  Memory:  Immediate;   Fair Recent;   Fair Remote;   Fair  Judgement:  Fair  Insight:  Fair  Psychomotor Activity:  Normal  Concentration:  Concentration: Fair and Attention Span: Fair  Recall:  [employer] of Knowledge: Fair  Language: Fair  Akathisia:  No  Handed:  Right  AIMS (if indicated): not done  Assets:  Communication Skills Desire for Improvement Housing Intimacy Social Support  ADL's:   Intact  Cognition: WNL  Sleep:   Restless   Screenings: AIMS    Flowsheet Row Video Visit from 03/03/2022 in [place] Total Score 0      GAD-7    Flowsheet Row Counselor from 10/27/2022 in [place] [facility] Video Visit from 10/19/2022 in [place] Office Visit from 09/03/2022 in [facility] Video Visit from 08/09/2022 in [place] Video Visit from 05/31/2022 in [facility]  Total GAD-7 Score '6 6 15 19 5      '$ PHQ2-9    Flowsheet Row Counselor from 10/27/2022 in [place] [facility] Video Visit from 10/19/2022 in [place] Office Visit from 09/03/2022 in [place] Video Visit from 08/09/2022 in [place] Video Visit from 05/31/2022 in [facility]  PHQ-2 Total Score 0 0 1 1 0  PHQ-9 Total Score 8 -- 9 11 --      Flowsheet Row Video Visit from 10/19/2022 in [place] Office Visit from 09/03/2022 in [place] Video Visit from 08/09/2022 in [place] CATEGORY No Risk Low Risk Low Risk        Assessment and Plan: MEMPHYS HENSARLING is a 42 year old Caucasian male, employed, lives in [place], has a history of MDD, GAD was evaluated in office today.  Patient is currently struggling with anxiety, will benefit from the following plan.  Plan MDD in remission Wellbutrin SR, will reduce to 100 mg p.o. twice daily Continue CBT with Mr. [name]  GAD-unstable Increase BuSpar to 20 mg  p.o. 3 times daily Klonopin 0.5 mg as needed for severe anxiety attacks Hydroxyzine 25-75 mg at bedtime as needed. Continue CBT-I have coordinated  care with his therapist Mr. [name]  Insomnia-unstable Patient to work on sleep hygiene techniques Lunesta 2 mg p.o. nightly as needed Patient to control his anxiety better by using mindfulness techniques, relaxation techniques since anxiety is likely contributing to sleep issues. Patient advised to use hydroxyzine or clonazepam at night as needed when he has a lot of sleep issues. Reviewed [place] PMP AWARxE  Follow-up in clinic in 6 to 8 weeks or sooner if needed.  Collaboration of Care: Collaboration of Care: Other encouraged to continue to follow-up with therapist, I have coordinated care.  Patient/Guardian was advised Release of Information must be obtained prior to any record release in order to collaborate their care with an outside provider. Patient/Guardian was advised if they have not already done so to contact the registration department to sign all necessary forms in order for [place] to release information regarding their care.   Consent: Patient/Guardian gives verbal consent for treatment and assignment of benefits for services provided during this visit. Patient/Guardian expressed understanding and agreed to proceed.   This note was generated in part or whole with voice recognition software. Voice recognition is usually quite accurate but there are transcription errors that can and very often do occur. I apologize for any typographical errors that were not detected and corrected.    Ursula Alert, MD 01/18/2023, 2:54 PM

## 2023-01-25 DIAGNOSIS — M9903 Segmental and somatic dysfunction of lumbar region: Secondary | ICD-10-CM | POA: Diagnosis not present

## 2023-01-25 DIAGNOSIS — M9901 Segmental and somatic dysfunction of cervical region: Secondary | ICD-10-CM | POA: Diagnosis not present

## 2023-01-25 DIAGNOSIS — M9902 Segmental and somatic dysfunction of thoracic region: Secondary | ICD-10-CM | POA: Diagnosis not present

## 2023-01-25 DIAGNOSIS — M9906 Segmental and somatic dysfunction of lower extremity: Secondary | ICD-10-CM | POA: Diagnosis not present

## 2023-01-31 ENCOUNTER — Ambulatory Visit: Payer: BC Managed Care – PPO | Admitting: Psychology

## 2023-01-31 DIAGNOSIS — F411 Generalized anxiety disorder: Secondary | ICD-10-CM | POA: Diagnosis not present

## 2023-01-31 DIAGNOSIS — F330 Major depressive disorder, recurrent, mild: Secondary | ICD-10-CM | POA: Diagnosis not present

## 2023-01-31 NOTE — Progress Notes (Signed)
[facility] Counselor/Therapist Progress Note  Patient ID: HASKELL MARKUSON, MRN: [id number]   Date: 01/31/23  Time Spent: 4:03  pm - 4:55 pm : 52 Minutes  Treatment Type: Individual Therapy.  Reported Symptoms: Depression and Anxiety  Mental Status Exam: Appearance:  Neat and Well Groomed     Behavior: Appropriate  Motor: Normal  Speech/Language:  Clear and Coherent  Affect: Congruent  Mood: normal  Thought process: normal  Thought content:   WNL  Sensory/Perceptual disturbances:   WNL  Orientation: oriented to person, place, time/date, and situation  Attention: Good  Concentration: Good  Memory: WNL  Fund of knowledge:  Good  Insight:   Good  Judgment:  Good  Impulse Control: Good   Risk Assessment: Danger to Self:  No Self-injurious Behavior: No Danger to Others: No Duty to Warn:no Physical Aggression / Violence:No  Access to Firearms a concern: No  Gang Involvement:No   Subjective:   Brandon Jackson participated from office, via video and consented to treatment. Therapist participated from home office. We met online due to [place] pandemic. Brandon Jackson reviewed the events of the past week. He noted an improvement in mood due to a medication change and having the 1st good week in some time. He noted decreasing his Wellbutrin to 100 mg from 150 mg and increased Buspar 20 mg tid. He noted continued middle insomnia, specifically between 3 am and 5 am. He noted at times being able to get back to sleep and at times, unsuccessful. He noted this being partly due to a neighbor and noted numerous interventions to address this. He noted rehashing of past relationships but noted improvement since last year. Brandon Jackson noted working through interpersonal stressors. He noted being forthcoming with his own needs and boundaries. He noted work on engaging socially more consistently. He noted frustration regarding being more ill in the past 6 months. He noted being fearful of  getting sick but noted this improving. Brandon Jackson noted continued work related stressors and his attempts to communicate boundaries and needs but not receiving an opportunity to give feedback and to advocate for self. He noted feeling burned out. 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 Therapist validated and normalized Brandon Jackson's feelings and experience. Therapist encouraged Brandon Jackson to locate a mindfulness workbook to work through and add to his tools and skills. Brandon Jackson was engaged and motivated during the session. Therapist provided supportive therapy.   Interventions: CBT & Interpersonal  Diagnosis:  MDD (major depressive disorder), recurrent episode, mild (HCC)  GAD (generalized anxiety disorder)  Psychiatric Treatment: Yes , Dr. Shea Evans. See chart for details.    Treatment Plan:  Client Abilities/Strengths Colsyn is intelligent, forthcoming, and motivated for change.   Support System: Family and friends.   Client Treatment Preferences Outpatient Therapy.   Client Statement of Needs Zacheriah would like to  manage overall symptoms, maintain work/home life balance, bolster coping skills, improve self-esteem, improve boundary setting, and increase mindfulness. He noted too much of his identify  wrapped up in work. Work in relation to core beliefs would also be beneficial.    Treatment Level Weekly  Symptoms  Depression: poor sleep, change in appetite, feeling bad about self, poor concentration, psycho-motor retardation.   (Status: maintained) Anxiety: Feeling anxious, difficulty managing worry, worrying about different things, restlessness, irritability, and feeling afraid something awful might happen.    (Status: maintained)  Goals:   Hascal experiences symptoms of  depression and anxiety.   Target Date: 12/07/23 Frequency: Weekly  Progress: 0 Modality: individual    Therapist will provide referrals for  additional resources as appropriate.  Therapist will provide psycho-education regarding [place]'s diagnosis and corresponding treatment approaches and interventions. Licensed Clinical Social Worker, [place], LCSW will support the patient's ability to achieve the goals identified. will employ CBT, BA, Problem-solving, Solution Focused, Mindfulness,  coping skills, & other evidenced-based practices will be used to promote progress towards healthy functioning to help manage decrease symptoms associated with his diagnosis.   Reduce overall level, frequency, and intensity of the feelings of depression, anxiety and panic evidenced by decreased overall symptoms from 6 to 7 days/week to 0 to 1 days/week per client report for at least 3 consecutive months. Verbally express understanding of the relationship between feelings of depression, anxiety and their impact on thinking patterns and behaviors. Verbalize an understanding of the role that distorted thinking plays in creating fears, excessive worry, and ruminations.    Brandon Jackson participated in the creation of the treatment plan)    [name], LCSW

## 2023-02-14 ENCOUNTER — Other Ambulatory Visit: Payer: Self-pay | Admitting: Psychiatry

## 2023-02-14 DIAGNOSIS — F330 Major depressive disorder, recurrent, mild: Secondary | ICD-10-CM

## 2023-02-14 DIAGNOSIS — F411 Generalized anxiety disorder: Secondary | ICD-10-CM

## 2023-02-21 DIAGNOSIS — M9901 Segmental and somatic dysfunction of cervical region: Secondary | ICD-10-CM | POA: Diagnosis not present

## 2023-02-21 DIAGNOSIS — M9906 Segmental and somatic dysfunction of lower extremity: Secondary | ICD-10-CM | POA: Diagnosis not present

## 2023-02-21 DIAGNOSIS — M9902 Segmental and somatic dysfunction of thoracic region: Secondary | ICD-10-CM | POA: Diagnosis not present

## 2023-02-21 DIAGNOSIS — M9903 Segmental and somatic dysfunction of lumbar region: Secondary | ICD-10-CM | POA: Diagnosis not present

## 2023-02-28 ENCOUNTER — Ambulatory Visit: Payer: BC Managed Care – PPO | Admitting: Psychology

## 2023-03-03 ENCOUNTER — Encounter: Payer: Self-pay | Admitting: Psychiatry

## 2023-03-03 ENCOUNTER — Telehealth (INDEPENDENT_AMBULATORY_CARE_PROVIDER_SITE_OTHER): Payer: BC Managed Care – PPO | Admitting: Psychiatry

## 2023-03-03 DIAGNOSIS — F5105 Insomnia due to other mental disorder: Secondary | ICD-10-CM

## 2023-03-03 DIAGNOSIS — F411 Generalized anxiety disorder: Secondary | ICD-10-CM | POA: Diagnosis not present

## 2023-03-03 DIAGNOSIS — F99 Mental disorder, not otherwise specified: Secondary | ICD-10-CM

## 2023-03-03 DIAGNOSIS — F3342 Major depressive disorder, recurrent, in full remission: Secondary | ICD-10-CM | POA: Diagnosis not present

## 2023-03-03 NOTE — Progress Notes (Signed)
Virtual Visit via Video Note  I connected with Brandon Jackson on 03/03/23 at  4:30 PM EDT by a video enabled telemedicine application and verified that I am speaking with the correct person using two identifiers.  Location Provider Location : [facility] Patient Location : Work  Participants: Patient , Provider   I discussed the limitations of evaluation and management by telemedicine and the availability of in person appointments. The patient expressed understanding and agreed to proceed.   I discussed the assessment and treatment plan with the patient. The patient was provided an opportunity to ask questions and all were answered. The patient agreed with the plan and demonstrated an understanding of the instructions.   The patient was advised to call back or seek an in-person evaluation if the symptoms worsen or if the condition fails to improve as anticipated.  BH MD OP Progress Note  03/04/2023 12:52 PM Brandon Jackson  MRN:  [id number]  Chief Complaint:  Chief Complaint  Patient presents with   Follow-up   Anxiety   Depression   Medication Refill   HPI: Brandon Jackson is a 42 year old Caucasian male, currently employed, lives in [place], has a history of MDD, GAD, hyperlipidemia, insomnia was evaluated by telemedicine today.  Patient today reports as improving since the last visit.  Reports he was able to take a vacation and that definitely helped.  Work-related stressors are currently getting better.  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  That seemed to help.  Patient also reports currently they are going through several changes and he is going to have a new boss sometime in June.  Patient reports relationship with his partner as improving.  Patient reports sleep has improved.  He does not have anxiety attacks or racing thoughts as he had before.  He stopped taking the Lunesta since he did not feel it was beneficial.  He  however would like to hold onto it for now and does not want to change it just in case.  He has been using doxylamine over-the-counter and that seems to help when he needs it.  He also has hydroxyzine as needed available.  Patient reports he tolerated lowering the dosage of Wellbutrin and did not have any worsening mood symptoms.  Patient reports the higher dosage of BuSpar is beneficial for his anxiety.  Denies side effects.  Patient denies any suicidality, homicidality or perceptual disturbances.  Patient denies any other concerns today.  Visit Diagnosis:    ICD-10-CM   1. MDD (major depressive disorder), recurrent, in full remission (HCC)  F33.42     2. GAD (generalized anxiety disorder)  F41.1     3. Insomnia due to other mental disorder  F51.05    F99    Anxiety      Past Psychiatric History: I have reviewed past psychiatric history from progress note on 12/23/2021.  Past trials of Lexapro, Effexor, Wellbutrin, Lunesta, trazodone.  Past Medical History:  Past Medical History:  Diagnosis Date   Anxiety    Depression    Frequent headaches    Gout    History of chicken pox    Migraines    Ulcer     Past Surgical History:  Procedure Laterality Date   CYSTECTOMY  2003   TONSILLECTOMY AND ADENOIDECTOMY  2007   Wisdom Teeth Removal  1998    Family Psychiatric History: I have reviewed family psychiatric history from progress note on 12/23/2021.  Family History:  Family History  Problem Relation Age of Onset   Depression Mother    Diabetes Father    Hyperlipidemia Father    Hypertension Father    Colon cancer Neg Hx    Prostate cancer Neg Hx     Social History: I have reviewed social history from progress note on 12/23/2021. Social History   Socioeconomic History   Marital status: Significant Other    Spouse name: Not on file   Number of children: Not on file   Years of education: College   Highest education level: Not on file  Occupational History    Occupation: communications    Employer: [employer]  Tobacco Use   Smoking status: Former    Types: Cigarettes    Quit date: 2013    Years since quitting: 11.3    Passive exposure: Past   Smokeless tobacco: Never  Vaping Use   Vaping Use: Never used  Substance and Sexual Activity   Alcohol use: Yes    Comment: occasional   Drug use: No   Sexual activity: Not on file  Other Topics Concern   Not on file  Social History Narrative   Single   Was in committed relationship 2007-2023.   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     Social Determinants of Health   Financial Resource Strain: Not on file  Food Insecurity: Not on file  Transportation Needs: Not on file  Physical Activity: Not on file  Stress: Not on file  Social Connections: Not on file    Allergies:  Allergies  Allergen Reactions   Wellbutrin [Bupropion] Other (See Comments)    Anxiety increased on med when taken alone.  He can tolerate wellbutrin when taken with lexparo.    Erythromycin Diarrhea and Rash    Metabolic Disorder Labs: No results found for: "HGBA1C", "MPG" No results found for: "PROLACTIN" Lab Results  Component Value Date   CHOL 154 05/17/2022   Brandon 111.0 05/17/2022   HDL 49.60 05/17/2022   CHOLHDL 3 05/17/2022   VLDL 22.2 05/17/2022   LDLCALC 82 05/17/2022   LDLCALC 82 10/12/2021   Lab Results  Component Value Date   TSH 1.24 05/17/2022   TSH 1.310 01/05/2022    Therapeutic Level Labs: No results found for: "LITHIUM" No results found for: "VALPROATE" No results found for: "CBMZ"  Current Medications: Current Outpatient Medications  Medication Sig Dispense Refill   eszopiclone (LUNESTA) 2 MG TABS tablet Take 2 mg by mouth at bedtime as needed for sleep. Take immediately before bedtime     allopurinol (ZYLOPRIM) 100 MG tablet Take 1 tablet (100 mg total) by mouth 2 (two) times daily. 180 tablet 3   Ascorbic Acid (VITAMIN C PO) Take by  mouth.     B Complex Vitamins (VITAMIN B COMPLEX PO) Take by mouth.     buPROPion ER (WELLBUTRIN SR) 100 MG 12 hr tablet Take 1 tablet (100 mg total) by mouth 2 (two) times daily. Take at 8 AM and 2 PM 60 tablet 1   busPIRone (BUSPAR) 10 MG tablet Take 2 tablets (20 mg total) by mouth 3 (three) times daily. 180 tablet 1   cetirizine (ZYRTEC) 10 MG tablet Take 1 tablet (10 mg total) by mouth daily.     clonazePAM (KLONOPIN) 0.5 MG tablet 1/2-1 TAB BY MOUTH 2X/DAY IF NEEDED FOR ANXIETY, 1/2-1 TAB AT NIGHT OK IF INSOMNIA, CAUTION SEDATION 20 tablet 1   emtricitabine-tenofovir (  TRUVADA) 200-300 MG tablet TAKE 1 TABLET BY MOUTH EVERY DAY 90 tablet 1   fluticasone (FLONASE) 50 MCG/ACT nasal spray Place into both nostrils daily.     hydrOXYzine (VISTARIL) 25 MG capsule TAKE 1-3 CAPSULES (25-75 MG TOTAL) AT BEDTIME AS NEEDED FOR ANXIETY AND SLEEP. 270 capsule 0   Lysine 1000 MG TABS      Multiple Vitamin (MULTIVITAMIN) tablet Take 1 tablet by mouth daily.     valACYclovir (VALTREX) 500 MG tablet Take 1 tablet (500 mg total) by mouth daily. 90 tablet 1   No current facility-administered medications for this visit.     Musculoskeletal: Strength & Muscle Tone:  UTA Gait & Station:  Seated Patient leans: N/A  Psychiatric Specialty Exam: Review of Systems  Psychiatric/Behavioral:  Positive for sleep disturbance. The patient is nervous/anxious.   All other systems reviewed and are negative.   There were no vitals taken for this visit.There is no height or weight on file to calculate BMI.  General Appearance: Casual  Eye Contact:  Fair  Speech:  Clear and Coherent  Volume:  Normal  Mood:  Anxious  Affect:  Appropriate  Thought Process:  Goal Directed and Descriptions of Associations: Intact  Orientation:  Full (Time, Place, and Person)  Thought Content: Logical   Suicidal Thoughts:  No  Homicidal Thoughts:  No  Memory:  Immediate;   Fair Recent;   Fair Remote;   Fair  Judgement:  Fair   Insight:  Fair  Psychomotor Activity:  Normal  Concentration:  Concentration: Fair and Attention Span: Fair  Recall:  [employer] of Knowledge: Fair  Language: Fair  Akathisia:  No  Handed:  Right  AIMS (if indicated): not done  Assets:  Communication Skills Desire for Improvement Social Support  ADL's:  Intact  Cognition: WNL  Sleep:   Improving   Screenings: AIMS    Flowsheet Row Video Visit from 03/03/2022 in [facility]  AIMS Total Score 0      GAD-7    Flowsheet Row Video Visit from 03/03/2023 in [facility] Office Visit from 01/18/2023 in [facility] Counselor from 10/27/2022 in [facility] Video Visit from 40/98/1191 in [facility] Office Visit from 09/03/2022 in [facility]  Total GAD-7 Score 8 17 6 6 15       PHQ2-9    Flowsheet Row Video Visit from 03/03/2023 in [facility] Office Visit from 01/18/2023 in [facility] Counselor from 10/27/2022 in [facility] at [facility] Video Visit from [id number]/82/9562 in [facility] Office Visit from 09/03/2022 in [facility]  PHQ-2 Total Score 1 2 0 0 1  PHQ-9 Total Score 6 9 8  -- 9      Flowsheet Row Video Visit from 03/03/2023 in [facility] Office Visit from 01/18/2023 in [facility] Video Visit from 10/19/2022 in [facility]  C-SSRS RISK CATEGORY No Risk Low Risk No Risk        Assessment and Plan: Brandon Jackson is a 42 year old Caucasian male, employed, lives  in [place], has a history of MDD, GAD was evaluated by telemedicine today.  Patient is currently improving, will benefit from the following plan.  Plan MDD in remission Wellbutrin SR 100 mg p.o. twice daily Continue CBT with Mr. [name]  GAD-improving BuSpar 20 mg p.o. 3 times daily Klonopin 0.5 mg as needed for severe anxiety attacks Hydroxyzine 25-75 mg at bedtime as needed Continue CBT  Insomnia-improving Patient does have Lunesta 2 mg p.o. nightly as needed available. Patient however currently using doxylamine over-the-counter as needed. Patient to work on sleep hygiene techniques. Patient also needs control of anxiety, does have hydroxyzine as well as clonazepam available as needed.  Patient advised to limit use. Reviewed [place]MP AWARxE   Discussed reducing the Wellbutrin further however patient would like to leave his medications where it is for now.  Will consider reviewing this again with patient in future sessions.  Follow-up in clinic in 2 months or sooner if needed. Consent: Patient/Guardian gives verbal consent for treatment and assignment of benefits for services provided during this visit. Patient/Guardian expressed understanding and agreed to proceed.   This note was generated in part or whole with voice recognition software. Voice recognition is usually quite accurate but there are transcription errors that can and very often do occur. I apologize for any typographical errors that were not detected and corrected.      Jomarie Longs, MD 03/04/2023, 12:52 PM

## 2023-03-14 DIAGNOSIS — M9902 Segmental and somatic dysfunction of thoracic region: Secondary | ICD-10-CM | POA: Diagnosis not present

## 2023-03-14 DIAGNOSIS — M9901 Segmental and somatic dysfunction of cervical region: Secondary | ICD-10-CM | POA: Diagnosis not present

## 2023-03-14 DIAGNOSIS — M9903 Segmental and somatic dysfunction of lumbar region: Secondary | ICD-10-CM | POA: Diagnosis not present

## 2023-03-14 DIAGNOSIS — M9906 Segmental and somatic dysfunction of lower extremity: Secondary | ICD-10-CM | POA: Diagnosis not present

## 2023-03-15 ENCOUNTER — Ambulatory Visit (INDEPENDENT_AMBULATORY_CARE_PROVIDER_SITE_OTHER): Payer: Self-pay | Admitting: Physician Assistant

## 2023-03-15 ENCOUNTER — Encounter: Payer: Self-pay | Admitting: Physician Assistant

## 2023-03-15 VITALS — BP 144/95 | HR 89 | Temp 96.9°F | Resp 16 | Wt 202.6 lb

## 2023-03-15 DIAGNOSIS — H8392 Unspecified disease of left inner ear: Secondary | ICD-10-CM

## 2023-03-15 MED ORDER — METHYLPREDNISOLONE 4 MG PO TBPK: ORAL_TABLET | ORAL | 0 refills | Status: DC

## 2023-03-15 NOTE — Progress Notes (Signed)
[facility] [address] [place], [place] [address]   Office Visit Note  Patient Name: Brandon Jackson Date of Birth [id number]  Medical Record number [id number]  Date of Service: 03/15/2023  Chief Complaint  Patient presents with   Ear Pain    Left ear     42 y/o M presents to the clinic for c/o left ear pain x 1 day. Pt denies nausea, vomiting, dizziness. He denies ear drainage. No recent air travel or open water activities. +h/o allergies for which he switches between Claritin or Zyrtec. Denies fever or chills. NO recent dental procedures.       Current Medication:  Outpatient Encounter Medications as of 03/15/2023  Medication Sig   allopurinol (ZYLOPRIM) 100 MG tablet Take 1 tablet (100 mg total) by mouth 2 (two) times daily.   Ascorbic Acid (VITAMIN C PO) Take by mouth.   B Complex Vitamins (VITAMIN B COMPLEX PO) Take by mouth.   buPROPion ER (WELLBUTRIN SR) 100 MG 12 hr tablet Take 1 tablet (100 mg total) by mouth 2 (two) times daily. Take at 8 AM and 2 PM   busPIRone (BUSPAR) 10 MG tablet Take 2 tablets (20 mg total) by mouth 3 (three) times daily.   cetirizine (ZYRTEC) 10 MG tablet Take 1 tablet (10 mg total) by mouth daily.   clonazePAM (KLONOPIN) 0.5 MG tablet 1/2-1 TAB BY MOUTH 2X/DAY IF NEEDED FOR ANXIETY, 1/2-1 TAB AT NIGHT OK IF INSOMNIA, CAUTION SEDATION   emtricitabine-tenofovir (TRUVADA) 200-300 MG tablet TAKE 1 TABLET BY MOUTH EVERY DAY   fluticasone (FLONASE) 50 MCG/ACT nasal spray Place into both nostrils daily.   hydrOXYzine (VISTARIL) 25 MG capsule TAKE 1-3 CAPSULES (25-75 MG TOTAL) AT BEDTIME AS NEEDED FOR ANXIETY AND SLEEP.   Lysine 1000 MG TABS    methylPREDNISolone (MEDROL DOSEPAK) 4 MG TBPK tablet Take as directed   Multiple Vitamin (MULTIVITAMIN) tablet Take 1 tablet by mouth daily.   valACYclovir (VALTREX) 500 MG tablet Take 1 tablet (500 mg total) by mouth daily.   eszopiclone (LUNESTA) 2 MG TABS tablet Take 2 mg by mouth at bedtime as  needed for sleep. Take immediately before bedtime (Patient not taking: Reported on 03/15/2023)   No facility-administered encounter medications on file as of 03/15/2023.      Medical History: Past Medical History:  Diagnosis Date   Anxiety    Depression    Frequent headaches    Gout    History of chicken pox    Migraines    Ulcer      Vital Signs: BP (!) 144/95   Pulse 89   Temp (!) 96.9 F (36.1 C) (Tympanic)   Resp 16   Wt 202 lb 9.6 oz (91.9 kg)   SpO2 98%   BMI 31.73 kg/m    Review of Systems  Constitutional: Negative.   HENT:  Positive for congestion and ear pain. Negative for ear discharge, postnasal drip, sinus pressure, sinus pain, sore throat and trouble swallowing.   Respiratory: Negative.    Cardiovascular: Negative.   Neurological: Negative.     Physical Exam Constitutional:      Appearance: Normal appearance.  HENT:     Head: Atraumatic.     Right Ear: Tympanic membrane, ear canal and external ear normal.     Left Ear: Tympanic membrane, ear canal and external ear normal.     Ears:     Comments: +air-fluid levels in both ear, but L>R ear. NO perforation or drainage  present. No erythema or swelling.     Nose: Nose normal.     Mouth/Throat:     Mouth: Mucous membranes are moist.     Pharynx: Oropharynx is clear.  Eyes:     Extraocular Movements: Extraocular movements intact.  Cardiovascular:     Rate and Rhythm: Normal rate and regular rhythm.  Pulmonary:     Effort: Pulmonary effort is normal.     Breath sounds: Normal breath sounds.  Musculoskeletal:     Cervical back: Neck supple.  Skin:    General: Skin is warm.  Neurological:     Mental Status: He is alert.  Psychiatric:        Mood and Affect: Mood normal.        Behavior: Behavior normal.        Thought Content: Thought content normal.        Judgment: Judgment normal.       Assessment/Plan:  1. Dysfunction of left inner ear - methylPREDNISolone (MEDROL DOSEPAK) 4 MG TBPK  tablet; Take as directed  Dispense: 1 each; Refill: 0  Reviewed my clinical findings with patient. Recommended taking 2 anti-histamines for 1 week Take medicine as prescribed Consider nasal decongestant ie Afrin as directed on the bottle for max of 3 days Will defer oral decongestant because of elevated BP readings in the past with Sudafed.  Pt verbalized understanding and in agreement.    General Counseling: gilmer rabuck understanding of the findings of todays visit and agrees with plan of treatment. I have discussed any further diagnostic evaluation that may be needed or ordered today. We also reviewed his medications today. he has been encouraged to call the office with any questions or concerns that should arise related to todays visit.    Time spent:20 Minutes    Gilberto Better, [place] Physician Assistant

## 2023-03-28 ENCOUNTER — Ambulatory Visit: Payer: BC Managed Care – PPO | Admitting: Psychology

## 2023-03-28 DIAGNOSIS — F3342 Major depressive disorder, recurrent, in full remission: Secondary | ICD-10-CM

## 2023-03-28 DIAGNOSIS — F411 Generalized anxiety disorder: Secondary | ICD-10-CM | POA: Diagnosis not present

## 2023-03-28 NOTE — Progress Notes (Unsigned)
[facility] Counselor/Therapist Progress Note  Patient ID: CAMRY FALER, MRN: [id number]   Date: 03/28/23  Time Spent: 4:05  pm - 4:37 pm : 32 Minutes  Treatment Type: Individual Therapy.  Reported Symptoms: Depression and Anxiety  Mental Status Exam: Appearance:  Neat and Well Groomed     Behavior: Appropriate  Motor: Normal  Speech/Language:  Clear and Coherent  Affect: Congruent  Mood: normal  Thought process: normal  Thought content:   WNL  Sensory/Perceptual disturbances:   WNL  Orientation: oriented to person, place, time/date, and situation  Attention: Good  Concentration: Good  Memory: WNL  Fund of knowledge:  Good  Insight:   Good  Judgment:  Good  Impulse Control: Good   Risk Assessment: Danger to Self:  No Self-injurious Behavior: No Danger to Others: No Duty to Warn:no Physical Aggression / Violence:No  Access to Firearms a concern: No  Gang Involvement:No   Subjective:   [name] participated from office, via video and consented to treatment. Therapist participated from home office. We met online due to COVID pandemic. Mikkel reviewed the events of the past week. Lynnox noted things going well in the past two weeks. He attributed this to a medication change, better sleep, and physically feeling well. He noted shifting perspective at work and setting his own expectations for self. He noted some improvement in his work dynamics at work. Rhone noted feeling more confident at work, as well. He noted upcoming changes at work, including a Runner, broadcasting/film/video arriving soon, which "stirred up the anxiety". He noted worry about being put on the spot and needing time to consider requests.  We worked on identifying his concerns and ways to communicate boundaries assertively and diplomatically. He noted things going well in his relationship due to feeling more relaxed. Tresten noted an improvement in dynamics. He noted some mild in his "critical  voice" in relation to his upcoming work change, a new Public house manager. He noted being hopeful to have a relaxing summer and hopes to take trips.  Therapist praised Craige for his effort to manage his mood proactively and engage in self-care. Gwen was engaged and motivated during the session. Therapist provided supportive therapy.   Interventions: CBT & Interpersonal  Diagnosis:  MDD (major depressive disorder), recurrent, in full remission (HCC)  GAD (generalized anxiety disorder)  Psychiatric Treatment: Yes , Dr. Elna Breslow. See chart for details.    Treatment Plan:  Client Abilities/Strengths Jovonn is intelligent, forthcoming, and motivated for change.   Support System: Family and friends.   Client Treatment Preferences Outpatient Therapy.   Client Statement of Needs Servando would like to  manage overall symptoms, maintain work/home life balance, bolster coping skills, improve self-esteem, improve boundary setting, and increase mindfulness. He noted too much of his identify  wrapped up in work. Work in relation to core beliefs would also be beneficial.    Treatment Level Weekly  Symptoms  Depression: poor sleep, change in appetite, feeling bad about self, poor concentration, psycho-motor retardation.   (Status: maintained) Anxiety: Feeling anxious, difficulty managing worry, worrying about different things, restlessness, irritability, and feeling afraid something awful might happen.    (Status: maintained)  Goals:   Dayren experiences symptoms of depression and anxiety.   Target Date: 12/07/23 Frequency: Weekly  Progress: 0 Modality: individual    Therapist will provide referrals for additional resources as appropriate.  Therapist will provide psycho-education regarding Temitayo's diagnosis and corresponding treatment approaches and interventions. Licensed Clinical Social Worker, [place], LCSW will  support the patient's ability to achieve the goals identified. will employ  CBT, BA, Problem-solving, Solution Focused, Mindfulness,  coping skills, & other evidenced-based practices will be used to promote progress towards healthy functioning to help manage decrease symptoms associated with his diagnosis.   Reduce overall level, frequency, and intensity of the feelings of depression, anxiety and panic evidenced by decreased overall symptoms from 6 to 7 days/week to 0 to 1 days/week per client report for at least 3 consecutive months. Verbally express understanding of the relationship between feelings of depression, anxiety and their impact on thinking patterns and behaviors. Verbalize an understanding of the role that distorted thinking plays in creating fears, excessive worry, and ruminations.    [name] participated in the creation of the treatment plan)    [name], LCSW

## 2023-04-02 DIAGNOSIS — N451 Epididymitis: Secondary | ICD-10-CM | POA: Diagnosis not present

## 2023-04-06 ENCOUNTER — Other Ambulatory Visit: Payer: Self-pay | Admitting: Family Medicine

## 2023-04-07 ENCOUNTER — Ambulatory Visit (INDEPENDENT_AMBULATORY_CARE_PROVIDER_SITE_OTHER): Payer: Self-pay | Admitting: Adult Health

## 2023-04-07 ENCOUNTER — Encounter: Payer: Self-pay | Admitting: Adult Health

## 2023-04-07 VITALS — BP 143/93 | HR 74 | Temp 96.3°F | Resp 16 | Wt 202.2 lb

## 2023-04-07 DIAGNOSIS — N50811 Right testicular pain: Secondary | ICD-10-CM

## 2023-04-07 MED ORDER — MELOXICAM 15 MG PO TABS
15.0000 mg | ORAL_TABLET | Freq: Every day | ORAL | 0 refills | Status: DC
Start: 2023-04-07 — End: 2023-06-30

## 2023-04-07 NOTE — Progress Notes (Signed)
[facility] [address] [place], [place] [address]   Office Visit Note  Patient Name: Brandon Jackson Date of Birth [id number]  Medical Record number [id number]  Date of Service: 04/07/2023  Chief Complaint  Patient presents with   Testicle Pain     HPI Pt is here for a sick visit. He reports right testicular pain that has been going on for 6 days.  History of epididymitis about 3 times int he past 10 years.  He saw teledoc 5 days ago and he started taking bactrim. He usually sees improvement in a few days, feels its not improving, getting worse.  He also had to take photos at graduation and was squatting, laying on floor etc.    Current Medication:  Outpatient Encounter Medications as of 04/07/2023  Medication Sig   allopurinol (ZYLOPRIM) 100 MG tablet Take 1 tablet (100 mg total) by mouth 2 (two) times daily.   Ascorbic Acid (VITAMIN C PO) Take by mouth.   B Complex Vitamins (VITAMIN B COMPLEX PO) Take by mouth.   buPROPion ER (WELLBUTRIN SR) 100 MG 12 hr tablet Take 1 tablet (100 mg total) by mouth 2 (two) times daily. Take at 8 AM and 2 PM   cetirizine (ZYRTEC) 10 MG tablet Take 1 tablet (10 mg total) by mouth daily.   clonazePAM (KLONOPIN) 0.5 MG tablet 1/2-1 TAB BY MOUTH 2X/DAY IF NEEDED FOR ANXIETY, 1/2-1 TAB AT NIGHT OK IF INSOMNIA, CAUTION SEDATION   emtricitabine-tenofovir (TRUVADA) 200-300 MG tablet TAKE 1 TABLET BY MOUTH EVERY DAY   fluticasone (FLONASE) 50 MCG/ACT nasal spray Place into both nostrils daily.   hydrOXYzine (VISTARIL) 25 MG capsule TAKE 1-3 CAPSULES (25-75 MG TOTAL) AT BEDTIME AS NEEDED FOR ANXIETY AND SLEEP.   Lysine 1000 MG TABS    meloxicam (MOBIC) 15 MG tablet Take 1 tablet (15 mg total) by mouth daily.   methylPREDNISolone (MEDROL DOSEPAK) 4 MG TBPK tablet Take as directed   Multiple Vitamin (MULTIVITAMIN) tablet Take 1 tablet by mouth daily.   valACYclovir (VALTREX) 500 MG tablet TAKE 1 TABLET (500 MG TOTAL) BY MOUTH DAILY.    eszopiclone (LUNESTA) 2 MG TABS tablet Take 2 mg by mouth at bedtime as needed for sleep. Take immediately before bedtime (Patient not taking: Reported on 03/15/2023)   No facility-administered encounter medications on file as of 04/07/2023.      Medical History: Past Medical History:  Diagnosis Date   Anxiety    Depression    Frequent headaches    Gout    History of chicken pox    Migraines    Ulcer      Vital Signs: BP (!) 143/93   Pulse 74   Temp (!) 96.3 F (35.7 C) (Tympanic)   Resp 16   Wt 202 lb 3.2 oz (91.7 kg)   SpO2 99%   BMI 31.67 kg/m    Review of Systems  Constitutional:  Negative for chills and fever.  Gastrointestinal:  Negative for abdominal pain, diarrhea, nausea and vomiting.  Genitourinary:  Positive for testicular pain. Negative for difficulty urinating, dysuria, flank pain, frequency, hematuria, penile discharge and penile pain.    Physical Exam Vitals and nursing note reviewed.  Constitutional:      Appearance: Normal appearance.  Abdominal:     Hernia: There is no hernia in the left inguinal area or right inguinal area.  Genitourinary:    Penis: Normal and circumcised. No erythema, tenderness or discharge.      Testes:  Cremasteric reflex is present.        Right: Tenderness present. Mass or swelling not present.        Left: Mass, tenderness or swelling not present.     Epididymis:     Right: Not enlarged. Tenderness present. No mass.     Left: Not enlarged. No mass or tenderness.  Lymphadenopathy:     Lower Body: No right inguinal adenopathy. No left inguinal adenopathy.  Neurological:     Mental Status: He is alert.    Assessment/Plan: 1. Testicular pain, right Finish previous antibiotics as prescribed by telehealth. Take Meloxicam for anti inflammatory Do not take other NSAIDS while on Meloxicam. Rest, may use heat or ice as discussed.  Wear supportive underwear, but do not wear them overly tight.  Will get Urine screen, and reports  results in mychart. Follow up via MyChart messenger if symptoms fail to improve or may return to clinic as needed for worsening symptoms.  Discussed Emergency Department precautions.  - Ct, Ng, Mycoplasmas NAA, Urine     General Counseling: Brandon Jackson verbalizes understanding of the findings of todays visit and agrees with plan of treatment. I have discussed any further diagnostic evaluation that may be needed or ordered today. We also reviewed his medications today. he has been encouraged to call the office with any questions or concerns that should arise related to todays visit.   Orders Placed This Encounter  Procedures   Ct, Ng, Mycoplasmas NAA, Urine    Meds ordered this encounter  Medications   meloxicam (MOBIC) 15 MG tablet    Sig: Take 1 tablet (15 mg total) by mouth daily.    Dispense:  30 tablet    Refill:  0    Time spent:20 Minutes    Brandon Jackson AGNP-C Nurse Practitioner

## 2023-04-09 LAB — CT, NG, MYCOPLASMAS NAA, URINE: Mycoplasma hominis NAA: NEGATIVE

## 2023-04-10 LAB — CT, NG, MYCOPLASMAS NAA, URINE
Mycoplasma genitalium NAA: NEGATIVE
Ureaplasma spp NAA: NEGATIVE

## 2023-04-11 ENCOUNTER — Encounter: Payer: Self-pay | Admitting: Adult Health

## 2023-04-18 DIAGNOSIS — M9901 Segmental and somatic dysfunction of cervical region: Secondary | ICD-10-CM | POA: Diagnosis not present

## 2023-04-18 DIAGNOSIS — M9902 Segmental and somatic dysfunction of thoracic region: Secondary | ICD-10-CM | POA: Diagnosis not present

## 2023-04-18 DIAGNOSIS — M9903 Segmental and somatic dysfunction of lumbar region: Secondary | ICD-10-CM | POA: Diagnosis not present

## 2023-04-18 DIAGNOSIS — M9906 Segmental and somatic dysfunction of lower extremity: Secondary | ICD-10-CM | POA: Diagnosis not present

## 2023-05-02 ENCOUNTER — Other Ambulatory Visit: Payer: Self-pay | Admitting: Psychiatry

## 2023-05-02 ENCOUNTER — Ambulatory Visit: Payer: BC Managed Care – PPO | Admitting: Psychology

## 2023-05-02 DIAGNOSIS — F411 Generalized anxiety disorder: Secondary | ICD-10-CM

## 2023-05-02 DIAGNOSIS — F3342 Major depressive disorder, recurrent, in full remission: Secondary | ICD-10-CM | POA: Diagnosis not present

## 2023-05-02 DIAGNOSIS — F330 Major depressive disorder, recurrent, mild: Secondary | ICD-10-CM

## 2023-05-02 NOTE — Progress Notes (Signed)
[facility] Counselor/Therapist Progress Note  Patient ID: ESGAR BARNICK, MRN: [id number]   Date: 05/02/23  Time Spent: 4:32  pm - 5:21 pm :  Treatment Type: Individual Therapy.  Reported Symptoms: Depression and Anxiety  Mental Status Exam: Appearance:  Neat and Well Groomed     Behavior: Appropriate  Motor: Normal  Speech/Language:  Clear and Coherent  Affect: Congruent  Mood: normal  Thought process: normal  Thought content:   WNL  Sensory/Perceptual disturbances:   WNL  Orientation: oriented to person, place, time/date, and situation  Attention: Good  Concentration: Good  Memory: WNL  Fund of knowledge:  Good  Insight:   Good  Judgment:  Good  Impulse Control: Good   Risk Assessment: Danger to Self:  No Self-injurious Behavior: No Danger to Others: No Duty to Warn:no Physical Aggression / Violence:No  Access to Firearms a concern: No  Gang Involvement:No   Subjective:   Brandon Jackson participated from office, via video, is aware of the limitations of tele-sessions, and consented to treatment. Therapist participated from home office. Brandon Jackson reviewed the events of the past week. 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 He noted experiencing anxiety prior to a meeting but noted this going well, overall. He noted things going well, in his relationship, overall. He did endorse sleep disturbance and noted working on this. He endorsed physically feeling anxiety specifically in chest area and feeling "foggy". We reviewed his coping including breathing and grounding techniques. Additionally, he noted challenging negative self-talk and working towards resolution. He noted a possible move with in with his partner. Therapist encouraged Brandon Jackson to identify possible common questions to ask a partner prior to moving in and worked on identifying resources for this. We discussed working on additional research  regarding home purchasing while not married. He noted engaging in consistent exercise including yard-work and walking. Therapist praised Brandon Jackson for his effort towards his effort in the session and to manage his anxiety. Therapist encouraged proactive symptom management and communicating needs and concerns to partner. Therapist provided supportive therapy. A follow-up was scheduled for continued treatment.   Interventions: CBT & Interpersonal  Diagnosis:  MDD (major depressive disorder), recurrent, in full remission (HCC)  GAD (generalized anxiety disorder)  Psychiatric Treatment: Yes , Dr. Elna Breslow. See chart for details.    Treatment Plan:  Client Abilities/Strengths Brandon Jackson is intelligent, forthcoming, and motivated for change.   Support System: Family and friends.   Client Treatment Preferences Outpatient Therapy.   Client Statement of Needs Brandon Jackson would like to  manage overall symptoms, maintain work/home life balance, bolster coping skills, improve self-esteem, improve boundary setting, and increase mindfulness. He noted too much of his identify  wrapped up in work. Work in relation to core beliefs would also be beneficial.    Treatment Level Weekly  Symptoms  Depression: poor sleep, change in appetite, feeling bad about self, poor concentration, psycho-motor retardation.   (Status: maintained) Anxiety: Feeling anxious, difficulty managing worry, worrying about different things, restlessness, irritability, and feeling afraid something awful might happen.    (Status: maintained)  Goals:   Brandon Jackson experiences symptoms of depression and anxiety.   Target Date: 12/07/23 Frequency: Weekly  Progress: 0 Modality: individual    Therapist will provide referrals for additional resources as appropriate.  Therapist will provide psycho-education regarding Brandon Jackson's diagnosis and corresponding treatment approaches and interventions. Licensed Clinical Social Worker, [place],  LCSW will support the patient's ability to achieve the goals  identified. will employ CBT, BA, Problem-solving, Solution Focused, Mindfulness,  coping skills, & other evidenced-based practices will be used to promote progress towards healthy functioning to help manage decrease symptoms associated with his diagnosis.   Reduce overall level, frequency, and intensity of the feelings of depression, anxiety and panic evidenced by decreased overall symptoms from 6 to 7 days/week to 0 to 1 days/week per client report for at least 3 consecutive months. Verbally express understanding of the relationship between feelings of depression, anxiety and their impact on thinking patterns and behaviors. Verbalize an understanding of the role that distorted thinking plays in creating fears, excessive worry, and ruminations.    Brandon Jackson participated in the creation of the treatment plan)    [name], LCSW

## 2023-05-04 ENCOUNTER — Other Ambulatory Visit: Payer: Self-pay | Admitting: Adult Health

## 2023-05-17 ENCOUNTER — Other Ambulatory Visit: Payer: Self-pay | Admitting: Family Medicine

## 2023-05-17 ENCOUNTER — Telehealth: Payer: BC Managed Care – PPO | Admitting: Psychiatry

## 2023-05-19 NOTE — Telephone Encounter (Signed)
Sent. Thanks.   

## 2023-05-19 NOTE — Telephone Encounter (Signed)
Refill request for clonazePAM (KLONOPIN) 0.5 MG tablet   LOV - 10/11/22 Next OV - not scheduled Last refill - 11/24/22 #20/1

## 2023-05-22 ENCOUNTER — Ambulatory Visit
Admission: EM | Admit: 2023-05-22 | Discharge: 2023-05-22 | Disposition: A | Discharge status: Home / Self Care | Payer: BC Managed Care – PPO | Attending: Internal Medicine | Admitting: Internal Medicine

## 2023-05-22 ENCOUNTER — Encounter: Payer: Self-pay | Admitting: Adult Health

## 2023-05-22 DIAGNOSIS — N451 Epididymitis: Secondary | ICD-10-CM

## 2023-05-22 DIAGNOSIS — N5082 Scrotal pain: Secondary | ICD-10-CM | POA: Diagnosis not present

## 2023-05-22 MED ORDER — LEVOFLOXACIN 500 MG PO TABS
500.0000 mg | ORAL_TABLET | Freq: Every day | ORAL | 0 refills | Status: AC
Start: 2023-05-22 — End: 2023-06-01

## 2023-05-22 NOTE — Discharge Instructions (Signed)
Start Levaquin once daily for 10 days.  Please follow-up with your PCP if your symptoms do not improve.  Please go to the emergency room for any worsening symptoms.  Hope you feel better soon!

## 2023-05-22 NOTE — ED Triage Notes (Signed)
Pt presents to UC w/ c/o testicular pain on the right posterior testicle since last night. Hx epididimitis 6 weeks ago Taking meloxicam which helps at times

## 2023-05-22 NOTE — ED Provider Notes (Signed)
[facility]    CSN: [id number] Arrival date & time: 05/22/23  1048      History   Chief Complaint Chief Complaint  Patient presents with   Testicle Pain    HPI Brandon Jackson is a 42 y.o. male presents for evaluation of testicular pain.  Patient reports last night he developed a right posterior testicular pain that has been waxing and waning since that time.  Denies any injury.  Denies any swelling, erythema, warmth.  No dysuria.  No fevers or chills.  States he has had this several times in the past and was recently treated for epididymitis 6 weeks ago with Bactrim with resolution of symptoms.  He did state it seems to take longer to resolve on Bactrim than it usually does.  Did recently have STD screening with his PCP that was negative and denies any recent or new STD exposures or concern.  No penile discharge.  He is also been taking meloxicam which does help his pain.  No other concerns at this time.   Testicle Pain    Past Medical History:  Diagnosis Date   Anxiety    Depression    Frequent headaches    Gout    History of chicken pox    Migraines    Ulcer     Patient Active Problem List   Diagnosis Date Noted   Cold sore 10/13/2022   History of URI (upper respiratory infection) 10/13/2022   Gout 05/19/2022   Change in weight 05/19/2022   On pre-exposure prophylaxis for HIV 05/19/2022   MDD (major depressive disorder), recurrent episode, mild (HCC) 03/03/2022   Insomnia due to other mental disorder 03/03/2022   Insomnia due to medical condition 12/23/2021   GAD (generalized anxiety disorder) 10/16/2021   Major depressive disorder, recurrent episode, moderate (HCC) 10/16/2021   Counseling on health promotion and disease prevention 10/14/2021   Routine general medical examination at a health care facility 12/14/2015   Advance care planning 12/14/2015   HLD (hyperlipidemia) 01/28/2015   Hyperglycemia 01/28/2015   Major depression 05/22/2014     Past Surgical History:  Procedure Laterality Date   CYSTECTOMY  2003   TONSILLECTOMY AND ADENOIDECTOMY  2007   Wisdom Teeth Removal  1998       Home Medications    Prior to Admission medications   Medication Sig Start Date End Date Taking? Authorizing Provider  levofloxacin (LEVAQUIN) 500 MG tablet Take 1 tablet (500 mg total) by mouth daily for 10 days. 05/22/23 06/01/23 Yes Radford Pax, NP  allopurinol (ZYLOPRIM) 100 MG tablet Take 1 tablet (100 mg total) by mouth 2 (two) times daily. 11/11/22   Joaquim Nam, MD  Ascorbic Acid (VITAMIN C PO) Take by mouth.    [provider]  B Complex Vitamins (VITAMIN B COMPLEX PO) Take by mouth.    [provider]  buPROPion ER (WELLBUTRIN SR) 100 MG 12 hr tablet Take 1 tablet by mouth twice daily at 8 AM and 2 PM. Dose change. 05/03/23   Darcel Smalling, MD  busPIRone (BUSPAR) 10 MG tablet Take 2 tablets by mouth three times daily. 05/03/23   Darcel Smalling, MD  cetirizine (ZYRTEC) 10 MG tablet Take 1 tablet (10 mg total) by mouth daily. 10/17/22   Joaquim Nam, MD  clonazePAM (KLONOPIN) 0.5 MG tablet TAKE 1/2 TO 1 TABLET BY MOUTH TWICE A DAY IF NEEDED FOR ANXIETY AND 1/2 TO 1 TABLET AT NIGHT IF NEEDED FOR INSOMNIA,  CAUTION SEDATION. 05/19/23   Joaquim Nam, MD  emtricitabine-tenofovir (TRUVADA) 200-300 MG tablet TAKE 1 TABLET BY MOUTH EVERY DAY 10/11/22   Joaquim Nam, MD  eszopiclone (LUNESTA) 2 MG TABS tablet Take 2 mg by mouth at bedtime as needed for sleep. Take immediately before bedtime Patient not taking: Reported on 03/15/2023    [provider]  fluticasone (FLONASE) 50 MCG/ACT nasal spray Place into both nostrils daily.    [provider]  hydrOXYzine (VISTARIL) 25 MG capsule TAKE 1-3 CAPSULES (25-75 MG TOTAL) AT BEDTIME AS NEEDED FOR ANXIETY AND SLEEP. 06/30/22   Jomarie Longs, MD  Lysine 1000 MG TABS  08/23/22   [provider]  meloxicam (MOBIC) 15 MG tablet Take 1 tablet  (15 mg total) by mouth daily. 04/07/23   Johnna Acosta, NP  methylPREDNISolone (MEDROL DOSEPAK) 4 MG TBPK tablet Take as directed 03/15/23   Gilberto Better, PA-C  Multiple Vitamin (MULTIVITAMIN) tablet Take 1 tablet by mouth daily.    [provider]  valACYclovir (VALTREX) 500 MG tablet TAKE 1 TABLET (500 MG TOTAL) BY MOUTH DAILY. 04/06/23   Joaquim Nam, MD    Family History Family History  Problem Relation Age of Onset   Depression Mother    Diabetes Father    Hyperlipidemia Father    Hypertension Father    Colon cancer Neg Hx    Prostate cancer Neg Hx     Social History Social History   Tobacco Use   Smoking status: Former    Current packs/day: 0.00    Types: Cigarettes    Quit date: 2013    Years since quitting: 11.5    Passive exposure: Past   Smokeless tobacco: Never  Vaping Use   Vaping status: Never Used  Substance Use Topics   Alcohol use: Yes    Comment: occasional   Drug use: No     Allergies   Wellbutrin [bupropion] and Erythromycin   Review of Systems Review of Systems  Genitourinary:  Positive for testicular pain.     Physical Exam Triage Vital Signs ED Triage Vitals  Encounter Vitals Group     BP 05/22/23 1121 137/88     Systolic BP Percentile --      Diastolic BP Percentile --      Pulse Rate 05/22/23 1121 86     Resp 05/22/23 1121 16     Temp 05/22/23 1121 98.1 F (36.7 C)     Temp Source 05/22/23 1121 Oral     SpO2 05/22/23 1121 98 %     Weight --      Height --      Head Circumference --      Peak Flow --      Pain Score 05/22/23 1119 5     Pain Loc --      Pain Education --      Exclude from Growth Chart --    No data found.  Updated Vital Signs BP 137/88 (BP Location: Right Arm)   Pulse 86   Temp 98.1 F (36.7 C) (Oral)   Resp 16   SpO2 98%   Visual Acuity Right Eye Distance:   Left Eye Distance:   Bilateral Distance:    Right Eye Near:   Left Eye Near:    Bilateral Near:     Physical Exam Vitals  and nursing note reviewed. Chaperone present: Occupational psychologist.  Constitutional:      General: He is not in acute distress.  Appearance: Normal appearance. He is not ill-appearing.  HENT:     Head: Normocephalic and atraumatic.  Eyes:     Pupils: Pupils are equal, round, and reactive to light.  Cardiovascular:     Rate and Rhythm: Normal rate.  Pulmonary:     Effort: Pulmonary effort is normal.  Genitourinary:    Penis: Circumcised.      Testes: Cremasteric reflex is present.        Right: Tenderness present. Mass, swelling, testicular hydrocele or varicocele not present. Right testis is descended. Cremasteric reflex is present.      Epididymis:     Right: Not inflamed or enlarged. Tenderness present. No mass.     Left: Normal.  Skin:    General: Skin is warm and dry.  Neurological:     General: No focal deficit present.     Mental Status: He is alert and oriented to person, place, and time.  Psychiatric:        Mood and Affect: Mood normal.        Behavior: Behavior normal.      UC Treatments / Results  Labs (all labs ordered are listed, but only abnormal results are displayed) Labs Reviewed - No data to display  EKG   Radiology No results found.  Procedures Procedures (including critical care time)  Medications Ordered in UC Medications - No data to display  Initial Impression / Assessment and Plan / UC Course  I have reviewed the triage vital signs and the nursing notes.  Pertinent labs & imaging results that were available during my care of the patient were reviewed by me and considered in my medical decision making (see chart for details).     Reviewed exam and symptoms with patient.  No red flags.  He declines any STD testing.  As he was recently on Bactrim for epididymitis will start Levaquin.  Side effect profile reviewed.  Patient to follow-up with PCP if symptoms do not improve.  ER precautions reviewed and patient verbalized understanding. Final  Clinical Impressions(s) / UC Diagnoses   Final diagnoses:  Epididymitis     Discharge Instructions      Start Levaquin once daily for 10 days.  Please follow-up with your PCP if your symptoms do not improve.  Please go to the emergency room for any worsening symptoms.  Hope you feel better soon!    ED Prescriptions     Medication Sig Dispense Auth. Provider   levofloxacin (LEVAQUIN) 500 MG tablet Take 1 tablet (500 mg total) by mouth daily for 10 days. 10 tablet Radford Pax, NP      PDMP not reviewed this encounter.   Radford Pax, NP 05/22/23 1152

## 2023-05-23 ENCOUNTER — Other Ambulatory Visit: Payer: Self-pay | Admitting: Adult Health

## 2023-05-23 ENCOUNTER — Ambulatory Visit: Payer: Self-pay

## 2023-05-23 DIAGNOSIS — N451 Epididymitis: Secondary | ICD-10-CM

## 2023-05-31 ENCOUNTER — Ambulatory Visit: Payer: BC Managed Care – PPO | Admitting: Psychology

## 2023-05-31 DIAGNOSIS — F411 Generalized anxiety disorder: Secondary | ICD-10-CM

## 2023-05-31 DIAGNOSIS — F3342 Major depressive disorder, recurrent, in full remission: Secondary | ICD-10-CM | POA: Diagnosis not present

## 2023-05-31 NOTE — Progress Notes (Signed)
[facility] Counselor/Therapist Progress Note  Patient ID: Brandon Jackson, MRN: [id number]   Date: 05/31/23  Time Spent: 4:02  pm - 5:00 pm :  Treatment Type: Individual Therapy.  Reported Symptoms: Depression and Anxiety  Mental Status Exam: Appearance:  Neat and Well Groomed     Behavior: Appropriate  Motor: Normal  Speech/Language:  Clear and Coherent  Affect: Congruent  Mood: normal  Thought process: normal  Thought content:   WNL  Sensory/Perceptual disturbances:   WNL  Orientation: oriented to person, place, time/date, and situation  Attention: Good  Concentration: Good  Memory: WNL  Fund of knowledge:  Good  Insight:   Good  Judgment:  Good  Impulse Control: Good   Risk Assessment: Danger to Self:  No Self-injurious Behavior: No Danger to Others: No Duty to Warn:no Physical Aggression / Violence:No  Access to Firearms a concern: No  Gang Involvement:No   Subjective:   Brandon Jackson participated from home, via video, is aware of the limitations of tele-sessions, and consented to treatment. Therapist participated from home office. Brandon Jackson reviewed the events of the past week. He noted being in pain regarding a reoccurring issue and noting a need to meet with a specialist for further treatment. He noted work related stressors and noted the new dean [employer]) being "very critical and blunt". He noted work being stressful and noted working on a project to review his work and noted this being "torn apart". He noted a need to put limits on perfectionism and preparation vs worry. He noted planning for the worst and noted "it's kind of what I do". He noted this affecting his anxiety. He noted a need to not personalize feedback. He noted his self-talk including "you've failed", "you're not cut out for this", & "you're not capable". He noted that the dean is likely "hard to please". We worked on identifying the evidence for this and worked on  identifying ways to look at these interactions more positively and to collect data and evidence. We worked on highlighting areas of control and lack of control. He noted expecting perfection from self and noted "I kinda always have". Therapist encouraged [name] to identify how to set expectations for self and the ingredients of how he sets expectations.  He noted often feeling like an "imposter". Therapist encouraged to define what it means to be "good enough". He noted wanting to not feeling incompetent and Accepting the "good enough". He highlighted that he is a pessimist and catastrophized while Thayer Ohm assumes the best.  He noted a need to reduce his rumination and noted some improvement in this area.     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 He noted experiencing anxiety prior to a meeting but noted this going well, overall. He noted things going well, in his relationship, overall. He did endorse sleep disturbance and noted working on this. He endorsed physically feeling anxiety specifically in chest area and feeling "foggy". We reviewed his coping including breathing and grounding techniques. Additionally, he noted challenging negative self-talk and working towards resolution. He noted a possible move with in with his partner. Therapist encouraged Brandon Jackson to identify possible common questions to ask a partner prior to moving in and worked on identifying resources for this. We discussed working on additional research regarding home purchasing while not married. He noted engaging in consistent exercise including yard-work and walking. Therapist praised Brandon Jackson for his effort towards his effort in the session and  to manage his anxiety. Therapist encouraged proactive symptom management and communicating needs and concerns to partner. Therapist provided supportive therapy. A follow-up was scheduled for continued treatment.   Interventions: CBT &  Interpersonal  Diagnosis:  MDD (major depressive disorder), recurrent, in full remission (HCC)  GAD (generalized anxiety disorder)  Psychiatric Treatment: Yes , Dr. Elna Breslow. See chart for details.    Treatment Plan:  Client Abilities/Strengths Clevon is intelligent, forthcoming, and motivated for change.   Support System: Family and friends.   Client Treatment Preferences Outpatient Therapy.   Client Statement of Needs Tequan would like to  manage overall symptoms, maintain work/home life balance, bolster coping skills, improve self-esteem, improve boundary setting, and increase mindfulness. He noted too much of his identify  wrapped up in work. Work in relation to core beliefs would also be beneficial.    Treatment Level Weekly  Symptoms  Depression: poor sleep, change in appetite, feeling bad about self, poor concentration, psycho-motor retardation.   (Status: maintained) Anxiety: Feeling anxious, difficulty managing worry, worrying about different things, restlessness, irritability, and feeling afraid something awful might happen.    (Status: maintained)  Goals:   Brandon Jackson experiences symptoms of depression and anxiety.   Target Date: 12/07/23 Frequency: Weekly  Progress: 0 Modality: individual    Therapist will provide referrals for additional resources as appropriate.  Therapist will provide psycho-education regarding Brandon Jackson's diagnosis and corresponding treatment approaches and interventions. Licensed Clinical Social Worker, Ritchie, LCSW will support the patient's ability to achieve the goals identified. will employ CBT, BA, Problem-solving, Solution Focused, Mindfulness,  coping skills, & other evidenced-based practices will be used to promote progress towards healthy functioning to help manage decrease symptoms associated with his diagnosis.   Reduce overall level, frequency, and intensity of the feelings of depression, anxiety and panic evidenced by  decreased overall symptoms from 6 to 7 days/week to 0 to 1 days/week per client report for at least 3 consecutive months. Verbally express understanding of the relationship between feelings of depression, anxiety and their impact on thinking patterns and behaviors. Verbalize an understanding of the role that distorted thinking plays in creating fears, excessive worry, and ruminations.    [name] participated in the creation of the treatment plan)    [name], LCSW

## 2023-06-01 ENCOUNTER — Other Ambulatory Visit: Payer: Self-pay

## 2023-06-01 DIAGNOSIS — N451 Epididymitis: Secondary | ICD-10-CM

## 2023-06-02 ENCOUNTER — Ambulatory Visit: Payer: BC Managed Care – PPO | Admitting: Urology

## 2023-06-02 ENCOUNTER — Encounter: Payer: Self-pay | Admitting: Urology

## 2023-06-02 VITALS — BP 125/87 | HR 94 | Ht 67.5 in | Wt 200.6 lb

## 2023-06-02 DIAGNOSIS — N451 Epididymitis: Secondary | ICD-10-CM | POA: Diagnosis not present

## 2023-06-02 LAB — BLADDER SCAN AMB NON-IMAGING

## 2023-06-02 MED ORDER — LEVOFLOXACIN 500 MG PO TABS: 500.0000 mg | ORAL_TABLET | Freq: Every day | ORAL | 0 refills | Status: DC

## 2023-06-02 NOTE — Progress Notes (Signed)
   06/02/23 3:43 PM   Brandon Jackson 15-May-1981 [id number]  CC: Recurrent epididymitis, right testicular pain  HPI: 42 year old male who reports recurrent episodes of epididymitis since 2017.  Symptoms are typically right testicular pain, and resolved with antibiotics.  He developed symptoms approximately 8 weeks ago on the right side and was treated with Bactrim which originally resolved his symptoms.  However symptoms returned last week and he was seen at urgent care and treated with a 7-day course of Levaquin.  STD testing has been negative.  Urinalysis today is benign.  He does think his symptoms have improved on Levaquin, but continues to have intermittent tenderness on the right side.  No prior imaging to review.  He denies any urinary symptoms.  PVR today normal at .  PMH: Past Medical History:  Diagnosis Date   Anxiety    Depression    Frequent headaches    Gout    History of chicken pox    Migraines    Ulcer     Surgical History: Past Surgical History:  Procedure Laterality Date   CYSTECTOMY  2003   TONSILLECTOMY AND ADENOIDECTOMY  2007   Wisdom Teeth Removal  1998   Family History: Family History  Problem Relation Age of Onset   Depression Mother    Diabetes Father    Hyperlipidemia Father    Hypertension Father    Colon cancer Neg Hx    Prostate cancer Neg Hx     Social History:  reports that he quit smoking about 11 years ago. His smoking use included cigarettes. He has been exposed to tobacco smoke. He has never used smokeless tobacco. He reports current alcohol use. He reports that he does not use drugs.  Physical Exam: BP 125/87 (BP Location: Left Arm, Patient Position: Sitting, Cuff Size: Large)   Pulse 94   Ht 5' 7.5" (1.715 m)   Wt 200 lb 9.6 oz (91 kg)   BMI 30.95 kg/m    Constitutional:  Alert and oriented, No acute distress. Cardiovascular: No clubbing, cyanosis, or edema. Respiratory: Normal respiratory effort, no increased work of  breathing. GI: Abdomen is soft, nontender, nondistended, no abdominal masses GU: Phallus with patent meatus, no lesions, testicles 20 cc and descended bilaterally without masses, mild right epididymal tenderness, no evidence of abscess or significant edema/swelling  Assessment & Plan:   42 year old male with recurrent right-sided epididymitis of unclear etiology, persistent right-sided symptoms despite recent 7-day course of Levaquin.  Short-term, I recommended extending the course of Levaquin by an additional 10 days, continuing NSAIDs and snug fitting underwear.  In terms of prevention I think it would be reasonable to start cranberry tablets to try to prevent recurrent episodes, he is emptying well today and urinalysis benign with no urinary symptoms that would be indicative of urethral stricture or other pathology.  He was interested in a scrotal ultrasound to evaluate any other cause of his symptoms, and I ordered this.  Reassurance provided that these are not typically symptoms of any type of malignancy, but that scrotal ultrasound will definitively rule out any testicular lesion.   -Levaquin extended 10 days for right epididymitis -NSAIDs, snug fitting underwear, icing, behavioral strategies discussed -Start cranberry tablets for infection prevention -Call with scrotal ultrasound results -RTC 3 to 4 months symptom check  Legrand Rams, MD 06/02/2023  [facility] [address] [place], [place] [address] [phone]

## 2023-06-02 NOTE — Patient Instructions (Signed)
Take cranberry tablets twice daily, this can help prevent urinary infection/epididymitis  Epididymitis  Epididymitis is inflammation or swelling of the epididymis. This is caused by an infection. The epididymis is a cord-like structure that is located along the top and back part of the testicle. It collects and stores sperm from the testicle. This condition can also cause pain and swelling of the testicle and scrotum. Symptoms usually start suddenly (acute epididymitis). Sometimes epididymitis starts gradually and lasts for a while (chronic epididymitis). Chronic epididymitis may be harder to treat. What are the causes? In men ages 6-40, this condition is usually caused by a bacterial infection or a sexually transmitted infection (STI), such as gonorrhea or chlamydia. In men 14 and older, this condition is usually caused by bacteria from a urinary blockage or from abnormalities in the urinary system. These can result from: Having a tube placed into the bladder (urinary catheter). Having an enlarged or inflamed prostate gland. Having recently had urinary tract surgery. Having a problem with a backward flow of urine (retrograde). In men who have a condition that weakens the body's defense system (immune system), such as human immunodeficiency virus (HIV), this condition can be caused by: Other bacteria, including tuberculosis and syphilis. Viruses. Fungi. Sometimes this condition occurs without infection. This may happen because of trauma or repetitive activities such as sports. What increases the risk? You are more likely to develop this condition if you have: Unprotected sex with more than one partner. Anal sex. Had recent surgery. A urinary catheter. Urinary problems. A suppressed immune system. What are the signs or symptoms? This condition usually begins suddenly with chills, fever, and pain behind the scrotum and in the testicle. Other symptoms include: Swelling of the scrotum,  testicle, or both. Pain when ejaculating or urinating. Pain in the back or abdomen. Nausea. Itching and discharge from the penis. A frequent need to pass urine. Redness, increased warmth, and tenderness of the scrotum. How is this diagnosed? Your health care provider can diagnose this condition based on your symptoms and medical history. Your health care provider will also do a physical exam to check your scrotum and testicle for swelling, pain, and redness. You may also have other tests, including: Testing of discharge from the penis. Testing your urine for infections, such as STIs. Ultrasound to check for blood flow and inflammation. Your health care provider may test you for other STIs, including HIV. How is this treated? Treatment for this condition depends on the cause. If your condition is caused by a bacterial infection, oral antibiotic medicine may be prescribed. If the bacterial infection has spread to your blood, you may need to receive IV antibiotics. For both bacterial and nonbacterial epididymitis, you may be treated with: Rest. Elevation of the scrotum. Pain medicines. Anti-inflammatory medicines. Surgery may be needed if: You have pus buildup in the scrotum (abscess). You have epididymitis that has not responded to other treatments. Follow these instructions at home: Medicines Take over-the-counter and prescription medicines only as told by your health care provider. If you were prescribed an antibiotic medicine, take it as told by your health care provider. Do not stop taking the antibiotic even if your condition improves. Sexual activity If your epididymitis was caused by an STI, avoid sexual activity until your treatment is complete. Inform your sexual partner or partners if you test positive for an STI. They may need to be treated. Do not engage in sexual activity with your partner or partners until their treatment is completed. Managing pain  and swelling  If  directed, raise (elevate) your scrotum and apply ice. To do this: Put ice in a plastic bag. Place a small towel or pillow between your legs. Rest your scrotum on the pillow or towel. Place another towel between your skin and the plastic bag. Leave the ice on for 20 minutes, 2-3 times a day. Remove the ice if your skin turns bright red. This is very important. If you cannot feel pain, heat, or cold, you have a greater risk of damage to the area. Keep your scrotum elevated and supported while resting. Ask your health care provider if you should wear a scrotal support, such as a jockstrap. Wear it as told by your health care provider. Try taking a sitz bath to help with discomfort. This is a warm water bath that is taken while you are sitting down. The water should come up to your hips and should cover your buttocks. Do this 3-4 times per day or as told by your health care provider. General instructions Drink enough fluid to keep your urine pale yellow. Return to your normal activities as told by your health care provider. Ask your health care provider what activities are safe for you. Keep all follow-up visits. This is important. Contact a health care provider if: You have a fever. Your pain medicine is not helping. Your pain is getting worse. Your symptoms do not improve within 3 days. Summary Epididymitis is inflammation or swelling of the epididymis. This is caused by an infection. This condition can also cause pain and swelling of the testicle and scrotum. Treatment for this condition depends on the cause. If your condition is caused by a bacterial infection, oral antibiotic medicine may be prescribed. Inform your sexual partner or partners if you test positive for an STI. They may need to be treated. Do not engage in sexual activity with your partner or partners until their treatment is completed. Contact a health care provider if your symptoms do not improve within 3 days. This information  is not intended to replace advice given to you by your health care provider. Make sure you discuss any questions you have with your health care provider. Document Revised: 06/03/2021 Document Reviewed: 06/03/2021 Elsevier Patient Education  2024 [employer].

## 2023-06-03 ENCOUNTER — Other Ambulatory Visit: Payer: Self-pay

## 2023-06-03 DIAGNOSIS — N451 Epididymitis: Secondary | ICD-10-CM

## 2023-06-06 ENCOUNTER — Encounter: Payer: Self-pay | Admitting: Family Medicine

## 2023-06-07 ENCOUNTER — Ambulatory Visit (HOSPITAL_BASED_OUTPATIENT_CLINIC_OR_DEPARTMENT_OTHER): Payer: BC Managed Care – PPO

## 2023-06-07 ENCOUNTER — Other Ambulatory Visit: Payer: Self-pay | Admitting: Child and Adolescent Psychiatry

## 2023-06-07 DIAGNOSIS — F411 Generalized anxiety disorder: Secondary | ICD-10-CM

## 2023-06-07 DIAGNOSIS — F330 Major depressive disorder, recurrent, mild: Secondary | ICD-10-CM

## 2023-06-08 NOTE — Telephone Encounter (Signed)
Dr. Eappen's pt

## 2023-06-14 ENCOUNTER — Encounter: Payer: Self-pay | Admitting: Family Medicine

## 2023-06-14 ENCOUNTER — Ambulatory Visit: Payer: BC Managed Care – PPO | Admitting: Family Medicine

## 2023-06-14 VITALS — BP 120/72 | HR 83 | Temp 96.0°F | Ht 67.5 in | Wt 200.0 lb

## 2023-06-14 DIAGNOSIS — N451 Epididymitis: Secondary | ICD-10-CM | POA: Diagnosis not present

## 2023-06-14 MED ORDER — LEVOFLOXACIN 500 MG PO TABS: 500.0000 mg | ORAL_TABLET | Freq: Every day | ORAL | 0 refills | Status: DC

## 2023-06-14 NOTE — Progress Notes (Unsigned)
He is off levaquin as of a few days ago.  Recurrent epididymitis.  Improved now with 2/10 pain.  3rd episode this year, mult episodes over the last few years.  Always on the R side.  Scrotal u/s pending.    Levaquin cautions d/w pt.    Had neg STI eval.    Meds, vitals, and allergies reviewed.   ROS: Per HPI unless specifically indicated in ROS section

## 2023-06-14 NOTE — Patient Instructions (Addendum)
I would get the ultrasound done and you should get a call about seeing Dr. Sherron Monday. Take levaquin only if you have another flare.   Take care.  Glad to see you.

## 2023-06-14 NOTE — Telephone Encounter (Signed)
Form has been printed for Dr. Para March to fill out after patients ov today.

## 2023-06-15 ENCOUNTER — Encounter: Payer: Self-pay | Admitting: Family Medicine

## 2023-06-15 DIAGNOSIS — N451 Epididymitis: Secondary | ICD-10-CM | POA: Insufficient documentation

## 2023-06-15 NOTE — Assessment & Plan Note (Signed)
I would get the ultrasound done and he should get a call about seeing Dr. Sherron Monday. Take levaquin only if another flare.   Supportive underwear in the meantime.  Update me as needed.  Work forms done.

## 2023-06-16 ENCOUNTER — Encounter: Payer: Self-pay | Admitting: *Deleted

## 2023-06-16 NOTE — Telephone Encounter (Signed)
I am so sorry! I did not even realize that this order was placed by Urology and not Dr Para March.   The patient will need to contact Urology to have them place a new order or change the location.   Dr Para March does not have to do anything as this was ordered by Urology initially.   Sorry for any confusion

## 2023-06-16 NOTE — Telephone Encounter (Signed)
Dr Para March,   Please change the location on the order from [facility] to [facility]  The current order was put in for [facility] and was scheduled with [facility], appts have been canceled due to cost.   Insurance preferred location is [facility]   Once the location is changed to [facility], the patient can call to schedule his [place], no insurance authorization is required. [facility] [phone]

## 2023-06-16 NOTE — Telephone Encounter (Signed)
Looks like this order was done by urology so I can not change any orders done by them. Do you wanna reorder the scan or have him call urology?

## 2023-06-17 ENCOUNTER — Ambulatory Visit (HOSPITAL_BASED_OUTPATIENT_CLINIC_OR_DEPARTMENT_OTHER): Payer: BC Managed Care – PPO

## 2023-06-20 ENCOUNTER — Encounter: Payer: Self-pay | Admitting: Psychiatry

## 2023-06-20 ENCOUNTER — Other Ambulatory Visit: Payer: Self-pay | Admitting: Family Medicine

## 2023-06-20 ENCOUNTER — Telehealth (INDEPENDENT_AMBULATORY_CARE_PROVIDER_SITE_OTHER): Payer: BC Managed Care – PPO | Admitting: Psychiatry

## 2023-06-20 DIAGNOSIS — F411 Generalized anxiety disorder: Secondary | ICD-10-CM

## 2023-06-20 DIAGNOSIS — N451 Epididymitis: Secondary | ICD-10-CM

## 2023-06-20 DIAGNOSIS — F99 Mental disorder, not otherwise specified: Secondary | ICD-10-CM | POA: Diagnosis not present

## 2023-06-20 DIAGNOSIS — F3342 Major depressive disorder, recurrent, in full remission: Secondary | ICD-10-CM | POA: Diagnosis not present

## 2023-06-20 DIAGNOSIS — F5105 Insomnia due to other mental disorder: Secondary | ICD-10-CM

## 2023-06-20 NOTE — Progress Notes (Unsigned)
Virtual Visit via Video Note  I connected with [name] on 06/20/23 at  1:00 PM EDT by a video enabled telemedicine application and verified that I am speaking with the correct person using two identifiers.  Location Provider Location : [facility] Patient Location : Work  Participants: Patient , Provider    I discussed the limitations of evaluation and management by telemedicine and the availability of in person appointments. The patient expressed understanding and agreed to proceed.    I discussed the assessment and treatment plan with the patient. The patient was provided an opportunity to ask questions and all were answered. The patient agreed with the plan and demonstrated an understanding of the instructions.   The patient was advised to call back or seek an in-person evaluation if the symptoms worsen or if the condition fails to improve as anticipated.   BH MD OP Progress Note  06/21/2023 9:13 AM KAORI ROEHL  MRN:  [id number]  Chief Complaint:  Chief Complaint  Patient presents with   Follow-up   Anxiety   Depression   Medication Refill   HPI: IOKEPA LISCIO is a 42 year old Caucasian male, currently employed, lives in [place], has a history of MDD, GAD, hyperlipidemia, insomnia was evaluated by telemedicine today.  Patient today reports it has been a challenging summer since he has been busy at work.  This week especially has been extra busy.  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  He has a presentation coming up and that also makes him anxious.  He does struggle with pain, was diagnosed with epididymitis.  That has been stressful.  Patient however reports he is currently following up with his therapist.  That has helped.  Sleep has improved, gets around 6 hours of sleep.  However due to the  pain and since he has to stay in one position for majority of the night that does affect his sleep to some extent.  He does not take the Lunesta anymore.  Currently using over-the-counter doxylamine.  That does seem to help.  Patient is currently compliant on the Wellbutrin, Celexa.  Denies side effects.  Patient denies any suicidality, homicidality or perceptual disturbances.  Patient denies any other concerns today.  Visit Diagnosis:    ICD-10-CM   1. MDD (major depressive disorder), recurrent, in full remission (HCC)  F33.42     2. GAD (generalized anxiety disorder)  F41.1     3. Insomnia due to other mental disorder  F51.05    F99    Anxiety      Past Psychiatric History: I have reviewed past psychiatric history from progress note on 12/23/2021.  Past trials of Lexapro, Effexor, Wellbutrin, Lunesta, trazodone.  Past Medical History:  Past Medical History:  Diagnosis Date   Anxiety    Depression    Frequent headaches    Gout    History of chicken pox    Migraines    Ulcer     Past Surgical History:  Procedure Laterality Date   CYSTECTOMY  2003   TONSILLECTOMY AND ADENOIDECTOMY  2007   Wisdom Teeth Removal  1998    Family Psychiatric History: I have reviewed family psychiatric history from progress note on 12/23/2021.  Family History:  Family History  Problem Relation Age of Onset   Depression Mother  Diabetes Father    Hyperlipidemia Father    Hypertension Father    Colon cancer Neg Hx    Prostate cancer Neg Hx     Social History: I have reviewed social history from progress note on 12/23/2021. Social History   Socioeconomic History   Marital status: Significant Other    Spouse name: Not on file   Number of children: Not on file   Years of education: College   Highest education level: Not on file  Occupational History   Occupation: communications    Employer: [employer]  Tobacco Use   Smoking status: Former    Current packs/day: 0.00     Types: Cigarettes    Quit date: 2013    Years since quitting: 11.6    Passive exposure: Past   Smokeless tobacco: Never  Vaping Use   Vaping status: Never Used  Substance and Sexual Activity   Alcohol use: Yes    Comment: occasional   Drug use: No   Sexual activity: Not on file  Other Topics Concern   Not on file  Social History Narrative   Single   Was in committed relationship 2007-2023.   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     Social Determinants of Health   Financial Resource Strain: Not on file  Food Insecurity: Not on file  Transportation Needs: Not on file  Physical Activity: Not on file  Stress: Not on file  Social Connections: Not on file    Allergies:  Allergies  Allergen Reactions   Wellbutrin [Bupropion] Other (See Comments)    Anxiety increased on med when taken alone.  He can tolerate wellbutrin when taken with lexparo.    Erythromycin Diarrhea and Rash    Metabolic Disorder Labs: No results found for: "HGBA1C", "MPG" No results found for: "PROLACTIN" Lab Results  Component Value Date   CHOL 154 05/17/2022   TRIG 111.0 05/17/2022   HDL 49.60 05/17/2022   CHOLHDL 3 05/17/2022   VLDL 22.2 05/17/2022   LDLCALC 82 05/17/2022   LDLCALC 82 10/12/2021   Lab Results  Component Value Date   TSH 1.24 05/17/2022   TSH 1.310 01/05/2022    Therapeutic Level Labs: No results found for: "LITHIUM" No results found for: "VALPROATE" No results found for: "CBMZ"  Current Medications: Current Outpatient Medications  Medication Sig Dispense Refill   allopurinol (ZYLOPRIM) 100 MG tablet Take 1 tablet (100 mg total) by mouth 2 (two) times daily. 180 tablet 3   Ascorbic Acid (VITAMIN C PO) Take by mouth.     B Complex Vitamins (VITAMIN B COMPLEX PO) Take by mouth.     buPROPion ER (WELLBUTRIN SR) 100 MG 12 hr tablet Take 1 tablet by mouth twice daily at 8 AM and 2 PM. Dose change. 60 tablet 2   busPIRone (BUSPAR) 10  MG tablet Take 2 tablets by mouth three times daily. 180 tablet 2   cetirizine (ZYRTEC) 10 MG tablet Take 1 tablet (10 mg total) by mouth daily.     clonazePAM (KLONOPIN) 0.5 MG tablet TAKE 1/2 TO 1 TABLET BY MOUTH TWICE A DAY IF NEEDED FOR ANXIETY AND 1/2 TO 1 TABLET AT NIGHT IF NEEDED FOR INSOMNIA, CAUTION SEDATION. 20 tablet 1   Cranberry 500 MG CAPS Take by mouth.     emtricitabine-tenofovir (TRUVADA) 200-300 MG tablet TAKE 1 TABLET BY MOUTH EVERY DAY 90 tablet 1   fluticasone (FLONASE) 50 MCG/ACT nasal spray  Place into both nostrils daily.     hydrOXYzine (VISTARIL) 25 MG capsule TAKE 1-3 CAPSULES (25-75 MG TOTAL) AT BEDTIME AS NEEDED FOR ANXIETY AND SLEEP. 270 capsule 0   Lysine 1000 MG TABS      meloxicam (MOBIC) 15 MG tablet Take 1 tablet (15 mg total) by mouth daily. 30 tablet 0   Multiple Vitamin (MULTIVITAMIN) tablet Take 1 tablet by mouth daily.     valACYclovir (VALTREX) 500 MG tablet TAKE 1 TABLET (500 MG TOTAL) BY MOUTH DAILY. 90 tablet 1   No current facility-administered medications for this visit.     Musculoskeletal: Strength & Muscle Tone:  UTA Gait & Station:  seated Patient leans: N/A  Psychiatric Specialty Exam: Review of Systems  Psychiatric/Behavioral:  Positive for sleep disturbance. The patient is nervous/anxious.     There were no vitals taken for this visit.There is no height or weight on file to calculate BMI.  General Appearance: Casual  Eye Contact:  Fair  Speech:  Normal Rate  Volume:  Normal  Mood:  Anxious  Affect:  Congruent  Thought Process:  Goal Directed and Descriptions of Associations: Intact  Orientation:  Full (Time, Place, and Person)  Thought Content: Logical   Suicidal Thoughts:  No  Homicidal Thoughts:  No  Memory:  Immediate;   Fair Recent;   Fair Remote;   Fair  Judgement:  Intact  Insight:  Fair  Psychomotor Activity:  Normal  Concentration:  Concentration: Fair and Attention Span: Fair  Recall:  [employer] of Knowledge:  Fair  Language: Fair  Akathisia:  No  Handed:  Right  AIMS (if indicated): not done  Assets:  Communication Skills Desire for Improvement Housing Social Support  ADL's:  Intact  Cognition: WNL  Sleep:   improving   Screenings: AIMS    Flowsheet Row Video Visit from 03/03/2022 in [facility]  AIMS Total Score 0      GAD-7    Flowsheet Row Office Visit from 06/14/2023 in [facility] Video Visit from 03/03/2023 in [facility] Office Visit from 01/18/2023 in [facility] Counselor from 10/27/2022 in [facility] at [facility] Video Visit from 60/45/4098 in [facility]  Total GAD-7 Score 6 8 17 6 6       PHQ2-9    Flowsheet Row Office Visit from 06/14/2023 in [facility] at [facility] Video Visit from 03/03/2023 in [facility] Office Visit from 01/18/2023 in [facility] Counselor from 10/27/2022 in [facility] at [facility] Video Visit from 11/91/4782 in [facility]  PHQ-2 Total Score 2 1 2  0 0  PHQ-9 Total Score 8 6 9 8  --      Flowsheet Row Video Visit from 06/20/2023 in [facility] ED from 05/22/2023 in [facility] at [facility] Video Visit from 03/03/2023 in [facility]  C-SSRS RISK CATEGORY No Risk No Risk No Risk        Assessment and Plan: SABAS SABET is a 42 year old Caucasian male, employed, lives in [place], has a history of MDD, GAD was evaluated by telemedicine today.  Patient is currently improving although he continues to have situational anxiety, also  has pain which does have  an impact on his mood although currently being managed by primary care provider, will benefit from psychotherapy sessions, plan as noted below.  Plan MDD in remission Wellbutrin SR 100 mg p.o. twice daily Continue CBT with Mr. Kenton Kingfisher.  GAD-improving BuSpar 20 mg p.o. 3 times daily Klonopin 0.5 mg as needed for severe anxiety attacks Hydroxyzine 25-75 mg at bedtime as needed Continue CBT  Insomnia-improving Patient will need sufficient pain management Continue doxylamine over-the-counter. Discontinue Lunesta for noncompliance and lack of benefit.   Discussed with patient to let provider know if interested in intermittent FMLA given his situational anxiety as well as comorbid pain.   Collaboration of Care: Collaboration of Care: Referral or follow-up with counselor/therapist AEB encouraged to continue CBT  Patient/Guardian was advised Release of Information must be obtained prior to any record release in order to collaborate their care with an outside provider. Patient/Guardian was advised if they have not already done so to contact the registration department to sign all necessary forms in order for [place] to release information regarding their care.   Consent: Patient/Guardian gives verbal consent for treatment and assignment of benefits for services provided during this visit. Patient/Guardian expressed understanding and agreed to proceed.   Follow-up in clinic in 2 to 3 months or sooner in person.  This note was generated in part or whole with voice recognition software. Voice recognition is usually quite accurate but there are transcription errors that can and very often do occur. I apologize for any typographical errors that were not detected and corrected.    Jomarie Longs, MD 06/21/2023, 9:13 AM

## 2023-06-20 NOTE — Telephone Encounter (Signed)
I put in the new order.  Let me know if that isn't going to work.  Thanks.

## 2023-06-21 ENCOUNTER — Ambulatory Visit
Admission: RE | Admit: 2023-06-21 | Discharge: 2023-06-21 | Disposition: A | Discharge status: Home / Self Care | Payer: BC Managed Care – PPO | Source: Ambulatory Visit | Attending: Family Medicine | Admitting: Family Medicine

## 2023-06-21 DIAGNOSIS — N451 Epididymitis: Secondary | ICD-10-CM

## 2023-06-21 DIAGNOSIS — N50819 Testicular pain, unspecified: Secondary | ICD-10-CM | POA: Diagnosis not present

## 2023-06-22 ENCOUNTER — Other Ambulatory Visit: Payer: Self-pay | Admitting: Adult Health

## 2023-06-30 ENCOUNTER — Ambulatory Visit (INDEPENDENT_AMBULATORY_CARE_PROVIDER_SITE_OTHER): Payer: Self-pay | Admitting: Adult Health

## 2023-06-30 ENCOUNTER — Encounter: Payer: Self-pay | Admitting: Adult Health

## 2023-06-30 DIAGNOSIS — N50811 Right testicular pain: Secondary | ICD-10-CM

## 2023-06-30 MED ORDER — MELOXICAM 15 MG PO TABS
15.0000 mg | ORAL_TABLET | Freq: Every day | ORAL | 0 refills | Status: DC
Start: 2023-06-30 — End: 2023-08-18

## 2023-06-30 MED ORDER — AMITRIPTYLINE HCL 10 MG PO TABS
10.0000 mg | ORAL_TABLET | Freq: Every day | ORAL | 0 refills | Status: DC
Start: 2023-06-30 — End: 2023-07-07

## 2023-06-30 NOTE — Progress Notes (Signed)
[facility] [address] [place], [place] [address]   Office Visit Note  Patient Name: Brandon Jackson Date of Birth [id number]  Medical Record number [id number]  Date of Service: 06/30/2023  Chief Complaint  Patient presents with   Testicle Pain     HPI Pt is here for follow up on chronic epididymitis.  See previous notes.  Patient reports continued issues with chronic epididymitis.  He has seen urology and taken a second course of Levaquin with no real change.  He describes the symptoms as mostly 4 out of 10 pain in the right testicle that is intermittent.  It is aggravated by standing, sitting, and most movements.  He does have a 30-minute commute to work which can be awful at times.  Also with his current role here at [employer], his activity level is high for this pain.  He has had a negative ultrasound as well as negative STI testing.  He describes his pain currently as "like a bruise".  In the last week he has had 8 out of 10 pain that lasted for approximately 24 hours.  He is nearly tearful in the exam room, frustrated with his pain and inability to get up well.   Current Medication:  Outpatient Encounter Medications as of 06/30/2023  Medication Sig   amitriptyline (ELAVIL) 10 MG tablet Take 1 tablet (10 mg total) by mouth at bedtime.   allopurinol (ZYLOPRIM) 100 MG tablet Take 1 tablet (100 mg total) by mouth 2 (two) times daily.   Ascorbic Acid (VITAMIN C PO) Take by mouth.   B Complex Vitamins (VITAMIN B COMPLEX PO) Take by mouth.   buPROPion ER (WELLBUTRIN SR) 100 MG 12 hr tablet Take 1 tablet by mouth twice daily at 8 AM and 2 PM. Dose change.   busPIRone (BUSPAR) 10 MG tablet Take 2 tablets by mouth three times daily.   cetirizine (ZYRTEC) 10 MG tablet Take 1 tablet (10 mg total) by mouth daily.   clonazePAM (KLONOPIN) 0.5 MG tablet TAKE 1/2 TO 1 TABLET BY MOUTH TWICE A DAY IF NEEDED FOR ANXIETY AND 1/2 TO 1 TABLET AT NIGHT IF NEEDED FOR INSOMNIA, CAUTION SEDATION.    Cranberry 500 MG CAPS Take by mouth.   emtricitabine-tenofovir (TRUVADA) 200-300 MG tablet TAKE 1 TABLET BY MOUTH EVERY DAY   fluticasone (FLONASE) 50 MCG/ACT nasal spray Place into both nostrils daily.   hydrOXYzine (VISTARIL) 25 MG capsule TAKE 1-3 CAPSULES (25-75 MG TOTAL) AT BEDTIME AS NEEDED FOR ANXIETY AND SLEEP.   Lysine 1000 MG TABS    meloxicam (MOBIC) 15 MG tablet Take 1 tablet (15 mg total) by mouth daily.   Multiple Vitamin (MULTIVITAMIN) tablet Take 1 tablet by mouth daily.   valACYclovir (VALTREX) 500 MG tablet TAKE 1 TABLET (500 MG TOTAL) BY MOUTH DAILY.   [DISCONTINUED] meloxicam (MOBIC) 15 MG tablet Take 1 tablet (15 mg total) by mouth daily.   No facility-administered encounter medications on file as of 06/30/2023.      Medical History: Past Medical History:  Diagnosis Date   Anxiety    Depression    Frequent headaches    Gout    History of chicken pox    Migraines    Ulcer      Vital Signs: There were no vitals taken for this visit.   Review of Systems  Constitutional:  Negative for chills, fatigue and fever.  Genitourinary:  Positive for testicular pain. Negative for penile discharge, penile pain, penile swelling, scrotal  swelling and urgency.    Physical Exam Vitals reviewed.  Constitutional:      Appearance: Normal appearance.  Genitourinary:    Comments: Deferred exam, as it would not change plan, pt agrees.  Neurological:     Mental Status: He is alert.    Assessment/Plan: 1. Testicular pain, right C7 neurologist as planned.  In the meanwhile continue meloxicam as needed.  Will add amitriptyline 10 mg at bedtime for the possibility of continued titration.  Follow-up in 5 to 6 days via MyChart or in person in clinic for follow-up and medication. - meloxicam (MOBIC) 15 MG tablet; Take 1 tablet (15 mg total) by mouth daily.  Dispense: 90 tablet; Refill: 0 - amitriptyline (ELAVIL) 10 MG tablet; Take 1 tablet (10 mg total) by mouth at bedtime.   Dispense: 30 tablet; Refill: 0     General Counseling: salil bucher understanding of the findings of todays visit and agrees with plan of treatment. I have discussed any further diagnostic evaluation that may be needed or ordered today. We also reviewed his medications today. he has been encouraged to call the office with any questions or concerns that should arise related to todays visit.   No orders of the defined types were placed in this encounter.   Meds ordered this encounter  Medications   meloxicam (MOBIC) 15 MG tablet    Sig: Take 1 tablet (15 mg total) by mouth daily.    Dispense:  90 tablet    Refill:  0   amitriptyline (ELAVIL) 10 MG tablet    Sig: Take 1 tablet (10 mg total) by mouth at bedtime.    Dispense:  30 tablet    Refill:  0    Time spent:25 Minutes    Johnna Acosta AGNP-C Nurse Practitioner

## 2023-07-02 ENCOUNTER — Other Ambulatory Visit: Payer: Self-pay | Admitting: Family Medicine

## 2023-07-04 ENCOUNTER — Ambulatory Visit: Payer: BC Managed Care – PPO | Admitting: Psychology

## 2023-07-04 DIAGNOSIS — F411 Generalized anxiety disorder: Secondary | ICD-10-CM | POA: Diagnosis not present

## 2023-07-04 DIAGNOSIS — F3342 Major depressive disorder, recurrent, in full remission: Secondary | ICD-10-CM | POA: Diagnosis not present

## 2023-07-04 NOTE — Progress Notes (Signed)
[facility] Counselor/Therapist Progress Note  Patient ID: Brandon Jackson, MRN: [id number]   Date: 07/04/23  Time Spent: 4:03  pm - 4:54 pm : 51 Minutes  Treatment Type: Individual Therapy.  Reported Symptoms: Depression and Anxiety  Mental Status Exam: Appearance:  Neat and Well Groomed     Behavior: Appropriate  Motor: Normal  Speech/Language:  Clear and Coherent  Affect: Congruent  Mood: anxious  Thought process: normal  Thought content:   WNL  Sensory/Perceptual disturbances:   WNL  Orientation: oriented to person, place, time/date, and situation  Attention: Good  Concentration: Good  Memory: WNL  Fund of knowledge:  Good  Insight:   Good  Judgment:  Good  Impulse Control: Good   Risk Assessment: Danger to Self:  No Self-injurious Behavior: No Danger to Others: No Duty to Warn:no Physical Aggression / Violence:No  Access to Firearms a concern: No  Gang Involvement:No   Subjective:   [name] participated from home, via video, is aware of the limitations of tele-sessions, and consented to treatment. Therapist participated from home office. [name] reviewed the events of the past week. He noted being in continued pain and noted going to his medical provider to follow-up regarding this issue. He noted this being quite painful and noted difficulty completing tasks at work as a result. He noted work related stressors and we explored these during the session. He noted working on seeing support from his International aid/development worker. He noted his effort to shift perspective regarding work and noted his work on being more assertive. Despite this, he noted recently waking during the night experiencing anxiety about the following day. He noted working on ways to get back to sleep including playing background noise, reminders that he will get to the task during business hours, and minimizing email checking throughout the day. Therapist encouraged [name] to keep a  pen and notepad next to his bed and to write down what he needs to do, should he wake up, and table it for the next day. 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 We discussed our work on challenging negative thoughts and working on managing distress. [name] noted identifying his increasing anxiety regarding fiances in relation to him and his friend, who is much more financially secure. HE discussed this resulting in stressors in the relationship. We explored this during the session and ways to communicate his feeling and to manage his financial stressors. He noted his work on building a larger support network. Therapist praised [name] for his effort in this area. He noted continued guilt regarding his breakup with [name]. He noted his effort to be more grateful, working on acceptance, and working on identifying his expectations day to day. Therapist encouraged [name] to investigate his expectations and ways to identify additional positives throughout the day. [name] discussed his interest in an in-person appointment and a follow-up was scheduled. [name] was engaged and motivated and expressed commitment towards goals. Therapist provided supportive therapy.   Diagnosis:  MDD (major depressive disorder), recurrent, in full remission (HCC)  GAD (generalized anxiety disorder)  Psychiatric Treatment: Yes , Dr. Elna Breslow. See chart for details.    Treatment Plan:  Client Abilities/Strengths Jaquain is intelligent, forthcoming, and motivated for change.   Support System: Family and friends.   Client Treatment Preferences Outpatient Therapy.  Client Statement of Needs [name] would like to  manage overall symptoms, maintain work/home life balance, bolster coping skills, improve self-esteem, improve boundary setting, and increase  mindfulness. He noted too much of his identify  wrapped up in work. Work in relation to core beliefs would also be beneficial.    Treatment Level Weekly  Symptoms  Depression: poor sleep, change in appetite, feeling bad about self, poor concentration, psycho-motor retardation.   (Status: maintained) Anxiety: Feeling anxious, difficulty managing worry, worrying about different things, restlessness, irritability, and feeling afraid something awful might happen.    (Status: declined)  Goals:   [name] experiences symptoms of depression and anxiety.   Target Date: 12/07/23 Frequency: Weekly  Progress: 0 Modality: individual    Therapist will provide referrals for additional resources as appropriate.  Therapist will provide psycho-education regarding [name]'s diagnosis and corresponding treatment approaches and interventions. Licensed Clinical Social Worker, [place], LCSW will support the patient's ability to achieve the goals identified. will employ CBT, BA, Problem-solving, Solution Focused, Mindfulness,  coping skills, & other evidenced-based practices will be used to promote progress towards healthy functioning to help manage decrease symptoms associated with his diagnosis.   Reduce overall level, frequency, and intensity of the feelings of depression, anxiety and panic evidenced by decreased overall symptoms from 6 to 7 days/week to 0 to 1 days/week per client report for at least 3 consecutive months. Verbally express understanding of the relationship between feelings of depression, anxiety and their impact on thinking patterns and behaviors. Verbalize an understanding of the role that distorted thinking plays in creating fears, excessive worry, and ruminations.    [name] participated in the creation of the treatment plan)    [name], LCSW

## 2023-07-07 ENCOUNTER — Encounter: Payer: Self-pay | Admitting: Family Medicine

## 2023-07-07 ENCOUNTER — Ambulatory Visit: Payer: BC Managed Care – PPO | Admitting: Family Medicine

## 2023-07-07 VITALS — BP 118/80 | HR 71 | Temp 97.8°F | Ht 67.5 in | Wt 206.0 lb

## 2023-07-07 DIAGNOSIS — F411 Generalized anxiety disorder: Secondary | ICD-10-CM

## 2023-07-07 DIAGNOSIS — N451 Epididymitis: Secondary | ICD-10-CM | POA: Diagnosis not present

## 2023-07-07 DIAGNOSIS — N50811 Right testicular pain: Secondary | ICD-10-CM | POA: Diagnosis not present

## 2023-07-07 MED ORDER — AMITRIPTYLINE HCL 10 MG PO TABS
10.0000 mg | ORAL_TABLET | Freq: Every day | ORAL | Status: DC
Start: 2023-07-07 — End: 2023-08-18

## 2023-07-07 MED ORDER — CLONAZEPAM 0.5 MG PO TABS: ORAL_TABLET | ORAL | 1 refills | Status: DC

## 2023-07-07 NOTE — Progress Notes (Signed)
D/w pt about testicular pain, current meds and rationale for use.  No recent days of profound severe pain but variable pain from day to day.  He had to call out of work yesterday due to pain, after being more active the prior two days.    Work accommodations discussed with patient.  He has significant pain with a flare.  It affects bending, interacting with others, lifting, performing manual tasks, sleeping, sitting, walking, working, driving.   He needs work accommodations to allow him to function as his pain allows when he has a flare. 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  See scanned forms. I filled out a parking form for patient.  He would be able to work more easily and it is a reasonable accomodation to use a smart phone instead of a typical camera at work when he is already in pain.    He has urology f/u pending.  Hasn't started amitriptyline yet, d/w pt about use.     He needed refill on klonopin.  Used prn, not sedated with use.    Meds, vitals, and allergies reviewed.  ROS: Per HPI unless specifically indicated in ROS section   GEN: nad, alert and oriented HEENT: mucous membranes moist NECK: supple w/o LA CV: rrr PULM: ctab, no inc wob

## 2023-07-07 NOTE — Patient Instructions (Signed)
I think it makes sense to try amitriptyline with sedation caution.   Take care.  Glad to see you. Check on seeing urology.

## 2023-07-07 NOTE — Telephone Encounter (Signed)
Form printed

## 2023-07-11 NOTE — Assessment & Plan Note (Signed)
With chronic recurrent testicular pain that episodically flares, more with activity.  See above.  He has urology follow-up pending.  Discussed neuropathic pain and starting amitriptyline.  See scanned forms regarding work accommodations.

## 2023-07-11 NOTE — Assessment & Plan Note (Signed)
He needed refill on klonopin.  Used prn, not sedated with use.  Continue as is.

## 2023-07-30 ENCOUNTER — Other Ambulatory Visit: Payer: Self-pay | Admitting: Family Medicine

## 2023-07-30 DIAGNOSIS — F330 Major depressive disorder, recurrent, mild: Secondary | ICD-10-CM

## 2023-07-30 DIAGNOSIS — F411 Generalized anxiety disorder: Secondary | ICD-10-CM

## 2023-08-02 ENCOUNTER — Ambulatory Visit: Payer: BC Managed Care – PPO | Admitting: Psychology

## 2023-08-02 DIAGNOSIS — F3342 Major depressive disorder, recurrent, in full remission: Secondary | ICD-10-CM | POA: Diagnosis not present

## 2023-08-02 DIAGNOSIS — F411 Generalized anxiety disorder: Secondary | ICD-10-CM

## 2023-08-02 NOTE — Progress Notes (Signed)
Brandon Jackson  Patient ID: Brandon Jackson, MRN: [id number]   Date: 08/02/23  Time Spent: 8:03 pm - 8:57 pm : 54 Minutes  Treatment Type: Individual Therapy.  Reported Symptoms: Depression and Anxiety  Mental Status Exam: Appearance:  Neat and Well Groomed     Behavior: Appropriate  Motor: Normal  Speech/Language:  Clear and Coherent  Affect: Congruent  Mood: anxious  Thought process: normal  Thought content:   WNL  Sensory/Perceptual disturbances:   WNL  Orientation: oriented to person, place, time/date, and situation  Attention: Good  Concentration: Good  Memory: WNL  Fund of knowledge:  Good  Insight:   Good  Judgment:  Good  Impulse Control: Good   Risk Assessment: Danger to Self:  No Self-injurious Behavior: No Danger to Others: No Duty to Warn:no Physical Aggression / Violence:No  Access to Firearms a concern: No  Gang Involvement:No   Subjective:   Brandon Jackson participated from home, via video, is aware of the limitations of tele-sessions, and consented to treatment. Therapist participated from home office. Brandon Jackson reviewed the events of the past week. 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 He noted the stress in dealing with this new boss and noted this being a source of stress. He noted being ostracized implicitly and discussed the effect of this on his mood at work home. He noted receiving some support from his direct supervisor and received acknowledgement of the need for two people in that position. Brandon Jackson noted his decision to identify any possible employment opportunities. He noted his efforts to not take this feedback personally, stay objective, while advocating for self and set boundaries. He noted difficulty managing the rumination during this past week. He noted his efforts to reframe stressors, think more positively, ask self  "what would a happy person think" about a situation, highlighting positives (enjoying company of coworkers), taking breaks (taking his lunch), looking at "big picture", following line of communication, and acceptance. He noted often waking feeling anxious due to the "amount of tasks that need to get done".  He noted a need to find a system that allows him to set the stage for the daily itinerary. We discussed how this might set the stage for realistic expectations for self and self-talk that would align. Therapist encouraged Brandon Jackson to be purposeful with check-ins to identify daily expectations and ensure that they are reasonable and attainable. We discussed time-limits for reflection of stressors. Therapist modeled this during the session. He noted improvement in his health, which he expressed gratitude. Therapist praised Brandon Jackson for his effort and energy during the session and encouraged continued efforts to manage mood proactively. A follow-up was scheduled. Brandon Jackson was engaged and motivated and expressed commitment towards goals. Therapist provided supportive therapy.   Diagnosis:  MDD (major depressive disorder), recurrent, in full remission (HCC)  GAD (generalized anxiety disorder)  Psychiatric Treatment: Yes , Brandon Jackson. See chart for details.    Treatment Plan:  Client Abilities/Strengths Brandon Jackson is intelligent, forthcoming, and motivated for change.   Support System: Family and friends.   Client Treatment Preferences Outpatient Therapy.   Client Statement of Needs Brandon Jackson would like to  manage overall symptoms, maintain work/home life balance, bolster coping skills, improve self-esteem, improve boundary setting, and increase mindfulness. He noted too much of his identify  wrapped up in work. Work in relation to core beliefs would also be beneficial.    Treatment  Level Weekly  Symptoms  Depression: poor sleep, change in appetite, feeling bad about self, poor concentration,  psycho-motor retardation.   (Status: maintained) Anxiety: Feeling anxious, difficulty managing worry, worrying about different things, restlessness, irritability, and feeling afraid something awful might happen.    (Status: declined)  Goals:   [name] experiences symptoms of depression and anxiety.   Target Date: 12/07/23 Frequency: Weekly  Progress: 0 Modality: individual    Therapist will provide referrals for additional resources as appropriate.  Therapist will provide psycho-education regarding Maximilian's diagnosis and corresponding treatment approaches and interventions. Licensed Clinical Social Worker, [place], Brandon Jackson will support the patient's ability to achieve the goals identified. will employ CBT, BA, Problem-solving, Solution Focused, Mindfulness,  coping skills, & other evidenced-based practices will be used to promote progress towards healthy functioning to help manage decrease symptoms associated with his diagnosis.   Reduce overall level, frequency, and intensity of the feelings of depression, anxiety and panic evidenced by decreased overall symptoms from 6 to 7 days/week to 0 to 1 days/week per client report for at least 3 consecutive months. Verbally express understanding of the relationship between feelings of depression, anxiety and their impact on thinking patterns and behaviors. Verbalize an understanding of the role that distorted thinking plays in creating fears, excessive worry, and ruminations.    Brandon Jackson participated in the creation of the treatment plan)    [name], Brandon Jackson

## 2023-08-18 ENCOUNTER — Other Ambulatory Visit: Payer: Self-pay | Admitting: Adult Health

## 2023-08-18 ENCOUNTER — Encounter: Payer: Self-pay | Admitting: Adult Health

## 2023-08-18 DIAGNOSIS — N50811 Right testicular pain: Secondary | ICD-10-CM

## 2023-08-18 MED ORDER — MELOXICAM 15 MG PO TABS
15.0000 mg | ORAL_TABLET | Freq: Every day | ORAL | 1 refills | Status: DC
Start: 2023-08-18 — End: 2024-03-29

## 2023-08-18 MED ORDER — AMITRIPTYLINE HCL 10 MG PO TABS
10.0000 mg | ORAL_TABLET | Freq: Every day | ORAL | 1 refills | Status: DC
Start: 2023-08-18 — End: 2023-08-22

## 2023-08-18 NOTE — Progress Notes (Signed)
Refilled Mobic and elavil for patient.

## 2023-08-21 ENCOUNTER — Other Ambulatory Visit: Payer: Self-pay | Admitting: Adult Health

## 2023-08-21 DIAGNOSIS — N50811 Right testicular pain: Secondary | ICD-10-CM

## 2023-08-22 ENCOUNTER — Other Ambulatory Visit: Payer: Self-pay | Admitting: Adult Health

## 2023-08-22 ENCOUNTER — Ambulatory Visit: Payer: BC Managed Care – PPO | Admitting: Psychology

## 2023-08-22 DIAGNOSIS — F411 Generalized anxiety disorder: Secondary | ICD-10-CM

## 2023-08-22 DIAGNOSIS — N50811 Right testicular pain: Secondary | ICD-10-CM

## 2023-08-22 DIAGNOSIS — F3342 Major depressive disorder, recurrent, in full remission: Secondary | ICD-10-CM | POA: Diagnosis not present

## 2023-08-22 MED ORDER — AMITRIPTYLINE HCL 10 MG PO TABS
10.0000 mg | ORAL_TABLET | Freq: Every day | ORAL | Status: DC
Start: 2023-08-22 — End: 2023-09-26

## 2023-08-22 NOTE — Progress Notes (Signed)
[facility] Counselor/Therapist Progress Note  Patient ID: Brandon Jackson, MRN: [id number]   Date: 08/22/23  Time Spent: 1:31 pm - 2:28  pm : 57 Minutes  Treatment Type: Individual Therapy.  Reported Symptoms: Depression and Anxiety  Mental Status Exam: Appearance:  Neat and Well Groomed     Behavior: Appropriate  Motor: Normal  Speech/Language:  Clear and Coherent  Affect: Congruent  Mood: anxious  Thought process: normal  Thought content:   WNL  Sensory/Perceptual disturbances:   WNL  Orientation: oriented to person, place, time/date, and situation  Attention: Good  Concentration: Good  Memory: WNL  Fund of knowledge:  Good  Insight:   Good  Judgment:  Good  Impulse Control: Good   Risk Assessment: Danger to Self:  No Self-injurious Behavior: No Danger to Others: No Duty to Warn:no Physical Aggression / Violence:No  Access to Firearms a concern: No  Gang Involvement:No   Subjective:   [name] participated from home, via video, is aware of the limitations of tele-sessions, and consented to treatment. Therapist participated from home office. Santez reviewed the events of the past week. Hakop noted his efforts to think more positively and noted this being difficult due to work related stressors. He noted receiving mixed signals at work from his supervisors. He noted difficulty keeping up and noted the toll of this on his mood and work/home-life balance. He noted his continued struggles to manage physical pain regarding a reoccurring health issue. He noted a need to set boundaries at work and noted a need to be more vocal with this. He noted his lack of vocalization is possibly tied to his childhood with teachings from both parents. He noted his self-esteem and self-worth playing a part, as well. He noted difficulty with a relationship stressor, which we explored during the session. He noted a need to modulate social media usage due to  stressors regarding the upcoming elections. Therapist highlighted Zylen's progress in identifying areas of control and lack of control and somatic complaints related to anxiety. He needed to focus on resetting expectations for self and others. We explored this during the session and ways to communicate directly, assertively, and diplomatically. He noted often assuming blame or guilt when not meeting expectations. We worked on processing this during the session. Therapist praised for his effort and progress in sessions and encouraged continued effort in this area. A follow-up was scheduled for continued treatment. Therapist provided supportive Therapy.   Diagnosis:  MDD (major depressive disorder), recurrent, in full remission (HCC)  GAD (generalized anxiety disorder)  Psychiatric Treatment: Yes , Dr. Elna Breslow. See chart for details.    Treatment Plan:  Client Abilities/Strengths Kamau is intelligent, forthcoming, and motivated for change.   Support System: Family and friends.   Client Treatment Preferences Outpatient Therapy.   Client Statement of Needs Rodriguez would like to  manage overall symptoms, maintain work/home life balance, bolster coping skills, improve self-esteem, improve boundary setting, and increase mindfulness. He noted too much of his identify  wrapped up in work. Work in relation to core beliefs would also be beneficial.    Treatment Level Weekly  Symptoms  Depression: poor sleep, change in appetite, feeling bad about self, poor concentration, psycho-motor retardation.   (Status: maintained) Anxiety: Feeling anxious, difficulty managing worry, worrying about different things, restlessness, irritability, and feeling afraid something awful might happen.    (Status: declined)  Goals:   Drey experiences symptoms of depression and anxiety.   Target Date: 12/07/23 Frequency: Weekly  Progress: 0 Modality: individual    Therapist will provide referrals for  additional resources as appropriate.  Therapist will provide psycho-education regarding Jakyri's diagnosis and corresponding treatment approaches and interventions. Licensed Clinical Social Worker, [place], LCSW will support the patient's ability to achieve the goals identified. will employ CBT, BA, Problem-solving, Solution Focused, Mindfulness,  coping skills, & other evidenced-based practices will be used to promote progress towards healthy functioning to help manage decrease symptoms associated with his diagnosis.   Reduce overall level, frequency, and intensity of the feelings of depression, anxiety and panic evidenced by decreased overall symptoms from 6 to 7 days/week to 0 to 1 days/week per client report for at least 3 consecutive months. Verbally express understanding of the relationship between feelings of depression, anxiety and their impact on thinking patterns and behaviors. Verbalize an understanding of the role that distorted thinking plays in creating fears, excessive worry, and ruminations.    [name] participated in the creation of the treatment plan)    [name], LCSW

## 2023-08-24 DIAGNOSIS — R102 Pelvic and perineal pain: Secondary | ICD-10-CM | POA: Diagnosis not present

## 2023-08-24 DIAGNOSIS — N451 Epididymitis: Secondary | ICD-10-CM | POA: Diagnosis not present

## 2023-08-24 DIAGNOSIS — N5201 Erectile dysfunction due to arterial insufficiency: Secondary | ICD-10-CM | POA: Diagnosis not present

## 2023-09-13 DIAGNOSIS — Z23 Encounter for immunization: Secondary | ICD-10-CM | POA: Diagnosis not present

## 2023-09-20 ENCOUNTER — Ambulatory Visit: Payer: BC Managed Care – PPO | Admitting: Psychology

## 2023-09-26 ENCOUNTER — Ambulatory Visit: Payer: BC Managed Care – PPO | Admitting: Psychiatry

## 2023-09-26 ENCOUNTER — Encounter: Payer: Self-pay | Admitting: Psychiatry

## 2023-09-26 VITALS — BP 136/89 | HR 76 | Temp 97.5°F | Ht 67.5 in | Wt 208.6 lb

## 2023-09-26 DIAGNOSIS — F3342 Major depressive disorder, recurrent, in full remission: Secondary | ICD-10-CM

## 2023-09-26 DIAGNOSIS — F99 Mental disorder, not otherwise specified: Secondary | ICD-10-CM | POA: Diagnosis not present

## 2023-09-26 DIAGNOSIS — F411 Generalized anxiety disorder: Secondary | ICD-10-CM

## 2023-09-26 DIAGNOSIS — F5105 Insomnia due to other mental disorder: Secondary | ICD-10-CM

## 2023-09-26 MED ORDER — BUSPIRONE HCL 10 MG PO TABS
20.0000 mg | ORAL_TABLET | Freq: Three times a day (TID) | ORAL | 3 refills | Status: DC
Start: 2023-09-26 — End: 2024-09-10

## 2023-09-26 MED ORDER — BUPROPION HCL ER (SR) 100 MG PO TB12: 100.0000 mg | ORAL_TABLET | Freq: Two times a day (BID) | ORAL | 3 refills | Status: DC

## 2023-09-26 NOTE — Progress Notes (Unsigned)
BH MD OP Progress Note  09/26/2023 10:40 AM Brandon BORNT  MRN:  [id number]  Chief Complaint:  Chief Complaint  Patient presents with   Follow-up   Depression   Anxiety   Medication Refill   HPI: Brandon Jackson is a 42 year old Caucasian male, currently employed, lives in [place], has a history of MDD, GAD, hyperlipidemia, insomnia was evaluated in office today.  Patient today reports he continues to have job related stressors.  Patient reports he is not happy with his work environment.  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  Patient reports he is currently looking to move to a different location within the same system.  He is hopeful he will be able to get this new job.  Patient reports overall mood symptoms as stable since the past couple of weeks.  Denies any significant anxiety attacks.  Currently compliant on the BuSpar, Wellbutrin.  Uses the clonazepam and hydroxyzine as needed only, limiting use.  Denies side effects to medications.  Patient reports sleep as restless mostly because of pain.  However was able to sleep this past weekend.  Used to be on amitriptyline however no longer being prescribed that medication.  Agrees to talk to his providers about his pain.  Patient currently denies any suicidality, homicidality or perceptual disturbances.  Patient does reports increased appetite especially at the end of the day when he has a lot of snacking, currently working on it.  Continues to follow-up with therapist Mr. [employer].  Patient denies any other concerns today.  Visit Diagnosis:    ICD-10-CM   1. MDD (major depressive disorder), recurrent, in full remission (HCC)  F33.42 buPROPion ER (WELLBUTRIN SR) 100 MG 12 hr tablet    busPIRone (BUSPAR) 10 MG tablet    2. GAD (generalized anxiety disorder)  F41.1 busPIRone (BUSPAR) 10 MG tablet    3. Insomnia due to other mental disorder  F51.05    F99    Anxiety      Past Psychiatric History: I  have reviewed past psychiatric history from progress note on 12/23/2021.  Past Medical History:  Past Medical History:  Diagnosis Date   Anxiety    Depression    Frequent headaches    Gout    History of chicken pox    Migraines    Ulcer     Past Surgical History:  Procedure Laterality Date   CYSTECTOMY  2003   TONSILLECTOMY AND ADENOIDECTOMY  2007   Wisdom Teeth Removal  1998    Family Psychiatric History: I have reviewed family psychiatric history from progress note on 12/23/2021.  Family History:  Family History  Problem Relation Age of Onset   Depression Mother    Diabetes Father    Hyperlipidemia Father    Hypertension Father    Colon cancer Neg Hx    Prostate cancer Neg Hx     Social History: I have reviewed social history from progress note on 12/23/2021. Social History   Socioeconomic History   Marital status: Significant Other    Spouse name: Not on file   Number of children: Not on file   Years of education: College   Highest education level: Not on file  Occupational History   Occupation: communications    Employer: [employer]  Tobacco Use   Smoking status: Former    Current packs/day: 0.00    Types: Cigarettes    Quit date: 2013    Years since quitting: 11.8  Passive exposure: Past   Smokeless tobacco: Never  Vaping Use   Vaping status: Never Used  Substance and Sexual Activity   Alcohol use: Yes    Comment: occasional   Drug use: No   Sexual activity: Not on file  Other Topics Concern   Not on file  Social History Narrative   Single   Was in committed relationship 2007-2023.   [employer] grad '03   Working at [employer] as of 2019, prev newspaper work in [place].     Social Determinants of Health   Financial Resource Strain: Not on file  Food Insecurity: Not on file  Transportation Needs: Not on file  Physical Activity: Not on file  Stress: Not on file  Social Connections: Not on file    Allergies:  Allergies  Allergen Reactions    Wellbutrin [Bupropion] Other (See Comments)    Anxiety increased on med when taken alone.  He can tolerate wellbutrin when taken with lexparo.    Erythromycin Diarrhea and Rash    Metabolic Disorder Labs: No results found for: "HGBA1C", "MPG" No results found for: "PROLACTIN" Lab Results  Component Value Date   CHOL 154 05/17/2022   TRIG 111.0 05/17/2022   HDL 49.60 05/17/2022   CHOLHDL 3 05/17/2022   VLDL 22.2 05/17/2022   LDLCALC 82 05/17/2022   LDLCALC 82 10/12/2021   Lab Results  Component Value Date   TSH 1.24 05/17/2022   TSH 1.310 01/05/2022    Therapeutic Level Labs: No results found for: "LITHIUM" No results found for: "VALPROATE" No results found for: "CBMZ"  Current Medications: Current Outpatient Medications  Medication Sig Dispense Refill   allopurinol (ZYLOPRIM) 100 MG tablet Take 1 tablet (100 mg total) by mouth 2 (two) times daily. 180 tablet 3   Ascorbic Acid (VITAMIN C PO) Take by mouth.     B Complex Vitamins (VITAMIN B COMPLEX PO) Take by mouth.     cetirizine (ZYRTEC) 10 MG tablet Take 1 tablet (10 mg total) by mouth daily.     clonazePAM (KLONOPIN) 0.5 MG tablet TAKE 1/2 TO 1 TABLET BY MOUTH TWICE A DAY IF NEEDED FOR ANXIETY AND 1/2 TO 1 TABLET AT NIGHT IF NEEDED FOR INSOMNIA, CAUTION SEDATION. 20 tablet 1   Cranberry 500 MG CAPS Take by mouth.     emtricitabine-tenofovir (TRUVADA) 200-300 MG tablet TAKE 1 TABLET BY MOUTH EVERY DAY 90 tablet 1   fluticasone (FLONASE) 50 MCG/ACT nasal spray Place into both nostrils daily.     hydrOXYzine (VISTARIL) 25 MG capsule TAKE 1-3 CAPSULES (25-75 MG TOTAL) AT BEDTIME AS NEEDED FOR ANXIETY AND SLEEP. 270 capsule 0   Lysine 1000 MG TABS      meloxicam (MOBIC) 15 MG tablet Take 1 tablet (15 mg total) by mouth daily. 90 tablet 1   Multiple Vitamin (MULTIVITAMIN) tablet Take 1 tablet by mouth daily.     tadalafil (CIALIS) 5 MG tablet Take 5 mg by mouth daily.     valACYclovir (VALTREX) 500 MG tablet TAKE 1 TABLET  (500 MG TOTAL) BY MOUTH DAILY. 90 tablet 1   buPROPion ER (WELLBUTRIN SR) 100 MG 12 hr tablet Take 1 tablet (100 mg total) by mouth 2 (two) times daily. 180 tablet 3   busPIRone (BUSPAR) 10 MG tablet Take 2 tablets (20 mg total) by mouth 3 (three) times daily. 540 tablet 3   No current facility-administered medications for this visit.     Musculoskeletal: Strength & Muscle Tone: within normal limits Gait & Station:  normal Patient leans: N/A  Psychiatric Specialty Exam: Review of Systems  Psychiatric/Behavioral:  Positive for sleep disturbance. The patient is nervous/anxious.     Blood pressure 136/89, pulse 76, temperature (!) 97.5 F (36.4 C), temperature source Skin, height 5' 7.5" (1.715 m), weight 208 lb 9.6 oz (94.6 kg).Body mass index is 32.19 kg/m.  General Appearance: Casual  Eye Contact:  Fair  Speech:  Clear and Coherent  Volume:  Normal  Mood:  Anxious coping okay  Affect:  Appropriate  Thought Process:  Goal Directed and Descriptions of Associations: Intact  Orientation:  Full (Time, Place, and Person)  Thought Content: Logical   Suicidal Thoughts:  No  Homicidal Thoughts:  No  Memory:  Immediate;   Fair Recent;   Fair Remote;   Fair  Judgement:  Fair  Insight:  Fair  Psychomotor Activity:  Normal  Concentration:  Concentration: Fair and Attention Span: Fair  Recall:  [employer] of Knowledge: Fair  Language: Fair  Akathisia:  No  Handed:  Right  AIMS (if indicated): not done  Assets:  Communication Skills Desire for Improvement Housing Social Support Talents/Skills Transportation  ADL's:  Intact  Cognition: WNL  Sleep:   Restless mostly due to pain   Screenings: AIMS    Flowsheet Row Video Visit from 03/03/2022 in [facility]  AIMS Total Score 0      GAD-7    Flowsheet Row Office Visit from 09/26/2023 in [facility] Office Visit from 07/07/2023 in [facility] Office Visit from 06/14/2023 in [facility] Video Visit from 03/03/2023 in [facility] Office Visit from 01/18/2023 in [facility]  Total GAD-7 Score 7 14 6 8 17       PHQ2-9    Flowsheet Row Office Visit from 09/26/2023 in [facility] Office Visit from 07[facility]07/2023 in [facility] Office Visit from 06/14/2023 in [facility] Video Visit from 03/03/2023 in [facility] Office Visit from 01/18/2023 in [facility]  PHQ-2 Total Score 0 2 2 1 2   PHQ-9 Total Score -- 9 8 6 9       Flowsheet Row Office Visit from 09/26/2023 in [facility] Video Visit from 06/20/2023 in [facility] ED from 05/22/2023 in [facility])  C-SSRS RISK CATEGORY No Risk No Risk No Risk        Assessment and Plan: HALEY KUCHERA is a 42 year old Caucasian male, employed, lives in [place], has a history of MDD, GAD was evaluated in person today.  Patient is currently improving on the current medication regimen although continues to have situational stressors including job-related stresses.  Patient continues to be motivated to stay in therapy, plan as noted below.  Plan MDD in remission Wellbutrin SR 100 mg p.o. twice daily Continue CBT with Mr. [name]  GAD-stable BuSpar 20 mg p.o. 3 times daily Klonopin 0.5 mg as needed for severe anxiety attacks Hydroxyzine 25-75 mg at bedtime as needed Continue CBT  Insomnia-improving Continue sufficient pain management Patient could also use doxylamine over-the-counter    Collaboration of Care: Collaboration of  Care: Referral or follow-up with counselor/therapist AEB patient encouraged to continue CBT.  Patient/Guardian was advised  Release of Information must be obtained prior to any record release in order to collaborate their care with an outside provider. Patient/Guardian was advised if they have not already done so to contact the registration department to sign all necessary forms in order for [place] to release information regarding their care.   Consent: Patient/Guardian gives verbal consent for treatment and assignment of benefits for services provided during this visit. Patient/Guardian expressed understanding and agreed to proceed.   Follow-up in clinic in 3 months or sooner if needed.  This note was generated in part or whole with voice recognition software. Voice recognition is usually quite accurate but there are transcription errors that can and very often do occur. I apologize for any typographical errors that were not detected and corrected.    Jomarie Longs, MD 09/28/2023, 5:40 PM

## 2023-10-13 ENCOUNTER — Ambulatory Visit: Payer: BC Managed Care – PPO | Admitting: Urology

## 2023-10-17 ENCOUNTER — Ambulatory Visit: Payer: BC Managed Care – PPO | Admitting: Psychology

## 2023-10-22 ENCOUNTER — Other Ambulatory Visit: Payer: Self-pay | Admitting: Family Medicine

## 2023-10-24 DIAGNOSIS — N5201 Erectile dysfunction due to arterial insufficiency: Secondary | ICD-10-CM | POA: Diagnosis not present

## 2023-10-24 DIAGNOSIS — N50811 Right testicular pain: Secondary | ICD-10-CM | POA: Diagnosis not present

## 2023-10-24 DIAGNOSIS — N451 Epididymitis: Secondary | ICD-10-CM | POA: Diagnosis not present

## 2023-11-15 ENCOUNTER — Other Ambulatory Visit: Payer: Self-pay | Admitting: Family Medicine

## 2023-11-15 NOTE — Telephone Encounter (Signed)
 Last office visit: 07/07/23  Next office visit: nothing scheduled Last refill: VALACYCLOVIR HCL 500 MG TABLET 04/06/23 90 tablets 1 refill

## 2023-11-22 ENCOUNTER — Ambulatory Visit: Payer: BC Managed Care – PPO | Admitting: Psychology

## 2023-11-22 DIAGNOSIS — F411 Generalized anxiety disorder: Secondary | ICD-10-CM

## 2023-11-22 DIAGNOSIS — F3342 Major depressive disorder, recurrent, in full remission: Secondary | ICD-10-CM | POA: Diagnosis not present

## 2023-11-22 NOTE — Progress Notes (Signed)
  Behavioral Health Counselor/Therapist Progress Note  Patient ID: Brandon Jackson, MRN: [id number]   Date: 11/22/23  Time Spent: 5:05 pm - 5:58 pm :  Treatment Type: Individual Therapy.  Reported Symptoms: Depression and Anxiety  Mental Status Exam: Appearance:  Neat and Well Groomed     Behavior: Appropriate  Motor: Normal  Speech/Language:  Clear and Coherent  Affect: Congruent  Mood: anxious  Thought process: normal  Thought content:   WNL  Sensory/Perceptual disturbances:   WNL  Orientation: oriented to person, place, time/date, and situation  Attention: Good  Concentration: Good  Memory: WNL  Fund of knowledge:  Good  Insight:   Good  Judgment:  Good  Impulse Control: Good   Risk Assessment: Danger to Self:  No Self-injurious Behavior: No Danger to Others: No Duty to Warn:no Physical Aggression / Violence:No  Access to Firearms a concern: No  Gang Involvement:No   Subjective:   Brandon Jackson participated from home, via video, is aware of the limitations of tele-sessions, and consented to treatment. Therapist participated from home office. Brandon Jackson reviewed the events of the past week. Brandon Jackson noted recent transitions including buying a new car and work related stressors including receiving mixed messages and antagonism. We explored these recent transitions during the session. 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 He noted being hopeful that he will get this position. Brandon Jackson, Brandon Jackson noted a need for a change professionally. He noted the possibility of moving to a home or getting a new apartment. He noted his brother putting us  (family) at arms length as a result of influence from his wife. We explored this during the session. He noted his own parents' worry that any additional feedback would sever the relationship. He noted working proactively to manage his mood and maintain awareness. He noted being able to sleep  better and a reduction of night-time sleep. He noted his goal to be Happy. We worked on defining this. Brandon Jackson noted enjoying what he has without worrying about everything else, being active, enjoying the company of family and friends. He noted continued pain and is slated for nerve block which affects his ability to be active. He noted this issue being recurrent. He noted this affecting his activity level and noted gaining weight as a result. He noted being slated for physical therapy prior to considering the never block. We worked on processing this during the session. He noted working on being more on my feet at work and being more flexible and less anxious. He noted a need to be more assertive and to stand his ground. We worked on identifying his current tools to manage distress at work. He noted reduced anxiety over getting ill. He noted a need for work related boundaries and discussed not knowing how to do it. We explored this during the session and worked on identifying what works for others and what areas he should concentrate and to work on orthoptist regarding this between sessions. Therapist praised Brandon Jackson for his effort and energy during the session. Therapist validated Brandon Jackson's feelings and experience and provided supportive therapy. A follow-up was scheduled for continued treatment.    Diagnosis:  MDD (major depressive disorder), recurrent, in full remission (HCC)  GAD (generalized anxiety disorder)  Psychiatric Treatment: Yes , Dr. Eappen. See chart for details.    Treatment Plan:  Client Abilities/Strengths Brandon Jackson is intelligent, forthcoming, and motivated for change.   Support System: Family and friends.   Client Treatment Preferences Outpatient Therapy.  Client Statement of Needs Brandon Jackson would like to  manage overall symptoms, maintain work/home life balance, bolster coping skills, improve self-esteem, improve boundary setting, and increase mindfulness. He noted  too much of his identify  wrapped up in work. Work in relation to core beliefs would also be beneficial.    Treatment Level Weekly  Symptoms  Depression: poor sleep, change in appetite, feeling bad about self, poor concentration, psycho-motor retardation.   (Status: maintained) Anxiety: Feeling anxious, difficulty managing worry, worrying about different things, restlessness, irritability, and feeling afraid something awful might happen.    (Status: maintained)  Goals:   Cha experiences symptoms of depression and anxiety.   Target Date: 12/07/23 Frequency: Weekly  Progress: 0 Modality: individual    Therapist will provide referrals for additional resources as appropriate.  Therapist will provide psycho-education regarding Shon's diagnosis and corresponding treatment approaches and interventions. Licensed Clinical Social Worker, [place], LCSW will support the patient's ability to achieve the goals identified. will employ CBT, BA, Problem-solving, Solution Focused, Mindfulness,  coping skills, & other evidenced-based practices will be used to promote progress towards healthy functioning to help manage decrease symptoms associated with his diagnosis.   Reduce overall level, frequency, and intensity of the feelings of depression, anxiety and panic evidenced by decreased overall symptoms from 6 to 7 days/week to 0 to 1 days/week per client report for at least 3 consecutive months. Verbally express understanding of the relationship between feelings of depression, anxiety and their impact on thinking patterns and behaviors. Verbalize an understanding of the role that distorted thinking plays in creating fears, excessive worry, and ruminations.    Brandon Jackson participated in the creation of the treatment plan)    [name], LCSW

## 2023-11-24 ENCOUNTER — Encounter: Payer: Self-pay | Admitting: Family Medicine

## 2023-11-24 ENCOUNTER — Other Ambulatory Visit: Payer: Self-pay | Admitting: Family Medicine

## 2023-11-24 MED ORDER — CLONAZEPAM 0.5 MG PO TABS: ORAL_TABLET | ORAL | 1 refills | Status: DC

## 2023-11-30 DIAGNOSIS — R102 Pelvic and perineal pain: Secondary | ICD-10-CM | POA: Diagnosis not present

## 2023-11-30 DIAGNOSIS — M62838 Other muscle spasm: Secondary | ICD-10-CM | POA: Diagnosis not present

## 2023-11-30 DIAGNOSIS — M6289 Other specified disorders of muscle: Secondary | ICD-10-CM | POA: Diagnosis not present

## 2023-11-30 DIAGNOSIS — N50811 Right testicular pain: Secondary | ICD-10-CM | POA: Diagnosis not present

## 2023-12-01 ENCOUNTER — Other Ambulatory Visit: Payer: Self-pay | Admitting: Family Medicine

## 2023-12-02 NOTE — Telephone Encounter (Signed)
Last office visit: 07/07/23 Next office visit: nothing scheduled Last refill:  Emtricitabine/Tenofovir Disoproxil Fumarate 200mg -300mg  Tablet 07/04/23 90 tablets 1 refill

## 2023-12-20 ENCOUNTER — Ambulatory Visit: Payer: BC Managed Care – PPO | Admitting: Psychology

## 2023-12-20 DIAGNOSIS — F411 Generalized anxiety disorder: Secondary | ICD-10-CM

## 2023-12-20 DIAGNOSIS — F3342 Major depressive disorder, recurrent, in full remission: Secondary | ICD-10-CM

## 2023-12-20 NOTE — Progress Notes (Signed)
 [facility] Counselor/Therapist Progress Note  Patient ID: ELWYN KLOSINSKI, MRN: [id number]   Date: 12/20/23  Time Spent: 4:03 pm - 4:53 pm :50 Minutes  Treatment Type: Individual Therapy.  Reported Symptoms: Depression and Anxiety  Mental Status Exam: Appearance:  Neat and Well Groomed     Behavior: Appropriate  Motor: Normal  Speech/Language:  Clear and Coherent  Affect: Congruent  Mood: anxious  Thought process: normal  Thought content:   WNL  Sensory/Perceptual disturbances:   WNL  Orientation: oriented to person, place, time/date, and situation  Attention: Good  Concentration: Good  Memory: WNL  Fund of knowledge:  Good  Insight:   Good  Judgment:  Good  Impulse Control: Good   Risk Assessment: Danger to Self:  No Self-injurious Behavior: No Danger to Others: No Duty to Warn:no Physical Aggression / Violence:No  Access to Firearms a concern: No  Gang Involvement:No   Subjective:   [name] participated from home, via video, is aware of the limitations of tele-sessions, and consented to treatment. Therapist participated from home office. Chase reviewed the events of the past week. 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 He noted being supported by his current supervisor. He noted being "pretty nervous" about the job interview. We worked on exploring this during the session and ways to manage this stress He noted, in his current position, having unreasonable expectations and noted having upper administration, the dean, being verbally aggressive and cursing. He noted the dean's behavior being unpredictable. He noted experiencing middle insomnia and noted generally being able to back to sleep. He noted upcoming transition of staying in his apartment, moving to a new apartment, and moving in with partner. He noted losing some support from work due to a coworker leaving for a different employment  opportunity. He noted difficulty sustaining his concentration and noted this affecting him across all settings. He noted difficulty engaging in activities that are not enjoyable. He noted a history of ADHD, in his family, for his younger brother Viviann Spare. Therapist provided the ASRS V1.1 via email to complete and return prior to our follow-up appointment. He noted struggles with the current political climate and noted that he and his partner do not align politically. We worked one exploring this during the session. Therapist validated Hugh's feelings and experience and provided supportive therapy. A Follow-up was scheduled for continued treatment which Garland benefits from.     Diagnosis:  MDD (major depressive disorder), recurrent, in full remission (HCC)  GAD (generalized anxiety disorder)  Psychiatric Treatment: Yes , Dr. Elna Breslow. See chart for details.    Treatment Plan:  Client Abilities/Strengths Trexton is intelligent, forthcoming, and motivated for change.   Support System: Family and friends.   Client Treatment Preferences Outpatient Therapy.   Client Statement of Needs Hodari would like to  manage overall symptoms, maintain work/home life balance, bolster coping skills, improve self-esteem, improve boundary setting, and increase mindfulness. He noted too much of his identify  wrapped up in work. Work in relation to core beliefs would also be beneficial.    Treatment Level Weekly  Symptoms  Depression: poor sleep, change in appetite, feeling bad about self, poor concentration, psycho-motor retardation.   (Status: maintained) Anxiety: Feeling anxious, difficulty managing worry, worrying about different things, restlessness, irritability, and feeling afraid something awful might happen.    (Status: maintained)  Goals:   Gurinder experiences symptoms of depression and anxiety.   Target Date: 2/29/25 Frequency: Weekly  Progress: 0 Modality: individual    Therapist  will provide referrals for additional resources as appropriate.  Therapist will provide psycho-education regarding Amaury's diagnosis and corresponding treatment approaches and interventions. Licensed Clinical Social Worker, [place], LCSW will support the patient's ability to achieve the goals identified. will employ CBT, BA, Problem-solving, Solution Focused, Mindfulness,  coping skills, & other evidenced-based practices will be used to promote progress towards healthy functioning to help manage decrease symptoms associated with his diagnosis.   Reduce overall level, frequency, and intensity of the feelings of depression, anxiety and panic evidenced by decreased overall symptoms from 6 to 7 days/week to 0 to 1 days/week per client report for at least 3 consecutive months. Verbally express understanding of the relationship between feelings of depression, anxiety and their impact on thinking patterns and behaviors. Verbalize an understanding of the role that distorted thinking plays in creating fears, excessive worry, and ruminations.    [name] participated in the creation of the treatment plan)    [name], LCSW

## 2023-12-26 ENCOUNTER — Encounter: Payer: Self-pay | Admitting: Psychiatry

## 2023-12-26 ENCOUNTER — Telehealth (INDEPENDENT_AMBULATORY_CARE_PROVIDER_SITE_OTHER): Payer: BC Managed Care – PPO | Admitting: Psychiatry

## 2023-12-26 DIAGNOSIS — F3342 Major depressive disorder, recurrent, in full remission: Secondary | ICD-10-CM | POA: Diagnosis not present

## 2023-12-26 DIAGNOSIS — F411 Generalized anxiety disorder: Secondary | ICD-10-CM | POA: Diagnosis not present

## 2023-12-26 DIAGNOSIS — F5105 Insomnia due to other mental disorder: Secondary | ICD-10-CM | POA: Diagnosis not present

## 2023-12-26 MED ORDER — HYDROXYZINE PAMOATE 25 MG PO CAPS: 25.0000 mg | ORAL_CAPSULE | Freq: Every evening | ORAL | 0 refills | Status: DC | PRN

## 2023-12-26 NOTE — Progress Notes (Unsigned)
 Virtual Visit via Video Note  I connected with Brandon Jackson on 12/26/23 at  4:00 PM EST by a video enabled telemedicine application and verified that I am speaking with the correct person using two identifiers.  Location Provider Location : [facility] Patient Location : Work  Participants: Patient , Provider    I discussed the limitations of evaluation and management by telemedicine and the availability of in person appointments. The patient expressed understanding and agreed to proceed.   I discussed the assessment and treatment plan with the patient. The patient was provided an opportunity to ask questions and all were answered. The patient agreed with the plan and demonstrated an understanding of the instructions.   The patient was advised to call back or seek an in-person evaluation if the symptoms worsen or if the condition fails to improve as anticipated.   BH MD OP Progress Note  12/27/2023 9:15 AM NEKODA CHOCK  MRN:  [id number]  Chief Complaint:  Chief Complaint  Patient presents with   Follow-up   Anxiety   Depression   Medication Refill   HPI: Brandon Jackson is a 43 year old Caucasian male, currently employed, lives in [place], has a history of MDD, GAD, hyperlipidemia, insomnia, was evaluated by telemedicine today.  He has been experiencing sleep disturbances, specifically waking up in the middle of the night between 3:30 and 4:30 AM over the past three weeks. Sometimes he can return to sleep, but other times he remains awake with a racing mind. He uses a podcast to help him fall back asleep, which is effective about 70% of the time. In the past two weeks, he has experienced this waking five times, with two instances where he could not return to sleep. He has not been using hydroxyzine for sleep but occasionally takes half a Klonopin if he anticipates difficulty sleeping due to a racing mind. He also uses over-the-counter Unisom when necessary, which he  finds somewhat effective.  His anxiety levels have been much lower despite a stressful period at work over the last six weeks. He mentions handling stress better. He is currently taking Wellbutrin 100 mg twice daily and Buspar 20 mg three times daily, which he reports are managing his anxiety symptoms well. He is also seeing therapist , Mr.Mostafa regularly.  He describes this winter as mild in terms of depression symptoms, which are currently well-managed with his medication regimen. He expresses concern about potential changes in SSRI and anti-anxiety medication regulations but is otherwise stable.  He mentions an increased appetite but is managing it by watching what and when he eats, particularly avoiding 'mindless munching' at night. He had gained some weight, which he attributes partly to taking meloxicam for pain and reduced physical activity due to another condition. However, he notes that the rapid weight gain has stopped, and he is maintaining or slightly losing weight recently.  Patient denies any suicidality, homicidality or perceptual disturbances.  Visit Diagnosis:    ICD-10-CM   1. MDD (major depressive disorder), recurrent, in full remission (HCC)  F33.42     2. GAD (generalized anxiety disorder)  F41.1 hydrOXYzine (VISTARIL) 25 MG capsule    3. Insomnia due to other mental disorder  F51.05 hydrOXYzine (VISTARIL) 25 MG capsule   F99    Anxiety, depression      Past Psychiatric History: I have reviewed past psychiatric history from progress note on 12/23/2021.  Past Medical History:  Past Medical History:  Diagnosis Date   Anxiety    Depression  Frequent headaches    Gout    History of chicken pox    Migraines    Ulcer     Past Surgical History:  Procedure Laterality Date   CYSTECTOMY  2003   TONSILLECTOMY AND ADENOIDECTOMY  2007   Wisdom Teeth Removal  1998    Family Psychiatric History: I have reviewed family psychiatric history from progress note on  12/23/2021.  Family History:  Family History  Problem Relation Age of Onset   Depression Mother    Diabetes Father    Hyperlipidemia Father    Hypertension Father    Colon cancer Neg Hx    Prostate cancer Neg Hx     Social History: I have reviewed social history from progress note on 12/23/2021. Social History   Socioeconomic History   Marital status: Significant Other    Spouse name: Not on file   Number of children: Not on file   Years of education: College   Highest education level: Not on file  Occupational History   Occupation: communications    Employer: [employer]  Tobacco Use   Smoking status: Former    Current packs/day: 0.00    Types: Cigarettes    Quit date: 2013    Years since quitting: 12.1    Passive exposure: Past   Smokeless tobacco: Never  Vaping Use   Vaping status: Never Used  Substance and Sexual Activity   Alcohol use: Yes    Comment: occasional   Drug use: No   Sexual activity: Not on file  Other Topics Concern   Not on file  Social History Narrative   Single   Was in committed relationship 2007-2023.   [employer] grad '03   Working at [employer] as of 2019, prev newspaper work in [place].     Social Drivers of Corporate investment banker Strain: Not on file  Food Insecurity: Not on file  Transportation Needs: Not on file  Physical Activity: Not on file  Stress: Not on file  Social Connections: Not on file    Allergies:  Allergies  Allergen Reactions   Wellbutrin [Bupropion] Other (See Comments)    Anxiety increased on med when taken alone.  He can tolerate wellbutrin when taken with lexparo.    Erythromycin Diarrhea and Rash    Metabolic Disorder Labs: No results found for: "HGBA1C", "MPG" No results found for: "PROLACTIN" Lab Results  Component Value Date   CHOL 154 05/17/2022   TRIG 111.0 05/17/2022   HDL 49.60 05/17/2022   CHOLHDL 3 05/17/2022   VLDL 22.2 05/17/2022   LDLCALC 82 05/17/2022   LDLCALC 82 10/12/2021   Lab  Results  Component Value Date   TSH 1.24 05/17/2022   TSH 1.310 01/05/2022    Therapeutic Level Labs: No results found for: "LITHIUM" No results found for: "VALPROATE" No results found for: "CBMZ"  Current Medications: Current Outpatient Medications  Medication Sig Dispense Refill   valACYclovir (VALTREX) 500 MG tablet TAKE 1 TABLET (500 MG TOTAL) BY MOUTH DAILY. 90 tablet 1   allopurinol (ZYLOPRIM) 100 MG tablet Take 1 tablet by mouth twice daily. 180 tablet 0   Ascorbic Acid (VITAMIN C PO) Take by mouth.     B Complex Vitamins (VITAMIN B COMPLEX PO) Take by mouth.     buPROPion ER (WELLBUTRIN SR) 100 MG 12 hr tablet Take 1 tablet (100 mg total) by mouth 2 (two) times daily. 180 tablet 3   busPIRone (BUSPAR) 10 MG tablet Take 2 tablets (20  mg total) by mouth 3 (three) times daily. 540 tablet 3   cetirizine (ZYRTEC) 10 MG tablet Take 1 tablet (10 mg total) by mouth daily.     clonazePAM (KLONOPIN) 0.5 MG tablet TAKE 1/2 TO 1 TABLET BY MOUTH TWICE A DAY IF NEEDED FOR ANXIETY AND 1/2 TO 1 TABLET AT NIGHT IF NEEDED FOR INSOMNIA, CAUTION SEDATION. 20 tablet 1   Cranberry 500 MG CAPS Take by mouth.     emtricitabine-tenofovir (TRUVADA) 200-300 MG tablet Take 1 tablet by mouth daily. 90 tablet 1   fluticasone (FLONASE) 50 MCG/ACT nasal spray Place into both nostrils daily.     hydrOXYzine (VISTARIL) 25 MG capsule Take 1-3 capsules (25-75 mg total) by mouth at bedtime as needed for anxiety. 270 capsule 0   Lysine 1000 MG TABS      meloxicam (MOBIC) 15 MG tablet Take 1 tablet (15 mg total) by mouth daily. 90 tablet 1   Multiple Vitamin (MULTIVITAMIN) tablet Take 1 tablet by mouth daily.     tadalafil (CIALIS) 5 MG tablet Take 5 mg by mouth daily.     No current facility-administered medications for this visit.     Musculoskeletal: Strength & Muscle Tone:  UTA Gait & Station:  Seated Patient leans: N/A  Psychiatric Specialty Exam: Review of Systems  Psychiatric/Behavioral:  The  patient is nervous/anxious.     There were no vitals taken for this visit.There is no height or weight on file to calculate BMI.  General Appearance: Casual  Eye Contact:  Fair  Speech:  Clear and Coherent  Volume:  Normal  Mood:  Anxious  Affect:  Appropriate  Thought Process:  Goal Directed and Descriptions of Associations: Intact  Orientation:  Full (Time, Place, and Person)  Thought Content: Logical   Suicidal Thoughts:  No  Homicidal Thoughts:  No  Memory:  Immediate;   Fair Recent;   Fair Remote;   Fair  Judgement:  Fair  Insight:  Fair  Psychomotor Activity:  Normal  Concentration:  Concentration: Fair and Attention Span: Fair  Recall:  [employer] of Knowledge: Fair  Language: Fair  Akathisia:  No  Handed:  Right  AIMS (if indicated): not done  Assets:  Desire for Improvement Housing Social Support Transportation  ADL's:  Intact  Cognition: WNL  Sleep:   overall ok   Screenings: AIMS    Flowsheet Row Video Visit from [facility]/03/2022 in [facility]  AIMS Total Score 0      GAD-7    Flowsheet Row Office Visit from 09/26/2023 in [facility] Office Visit from 07/07/2023 in [facility] Office Visit from 06/14/2023 in [facility] Video Visit from 03/03/2023 in [facility] Office Visit from 01/18/2023 in [facility]  Total GAD-7 Score 7 14 6 8 17       PHQ2-9    Flowsheet Row Office Visit from 09/26/2023 in [facility] Office Visit from 07/07/2023 in [facility] Visit from 06/14/2023 in [facility] Video Visit from 03/03/2023 in [facility] Office Visit from 01/18/2023 in [facility]  PHQ-2 Total Score 0 2 2 1 2   PHQ-9 Total Score -- 9 8 6 9       Flowsheet Row Video Visit from  12/26/2023 in [facility] Office Visit from 09/26/2023 in [facility] Video Visit from 06/20/2023 in [facility]  C-SSRS RISK CATEGORY No Risk No Risk No Risk        Assessment and Plan: TORYN MCCLINTON is a 43 year old Caucasian male, employed, lives in [place], has a history of MDD, GAD was evaluated by telemedicine today.  Patient is currently overall doing well with regards to his mood and sleep, discussed assessment and plan as noted below.  Generalized Anxiety Disorder-stable Herbie reports reduced anxiety despite work stress.  Anxiety currently well-managed on current medication regimen. - Continue Buspar 20 mg TID   - Continue Wellbutrin 100 mg BID   - Use Klonopin 0.5 mg PRN for racing thoughts before bed   - Continue Hydroxyzine 25-75 mg at bedtime as needed. - Continue CBT with Mr.Osman Mostafa  Depression in remission Lyndon's depression is mild this winter, attributed to the current medication regimen. He is continuing therapy and has no thoughts of self-harm.   - Continue current medication regimen   - Continue therapy sessions    Insomnia-stable Andrell reports waking up around 3:30-4:30 AM five times in the past two weeks, with difficulty returning to sleep on two occasions. Podcasts help 70% of the time. Unisom is used occasionally. Advised to keep a sleep log and consider hydroxyzine nightly if issues persist.   - Keep a sleep log   - Consider Hydroxyzine 25 mg - 75 mg nightly if sleep issues persist   - Use Klonopin 0.5 mg PRN for severe insomnia   - Limit Doxylamine (Unisom) use over-the-counter.  Could use as needed.   Follow-up   - Schedule follow-up appointment for May 12th at 4 PM,  virtual.   Collaboration of Care: Collaboration of Care: Referral or follow-up with counselor/therapist AEB encouraged to continue CBT  Patient/Guardian was advised Release of Information must be obtained prior to any record release in order to collaborate their care with an outside provider. Patient/Guardian was advised if they have not already done so to contact the registration department to sign all necessary forms in order for [place] to release information regarding their care.   Consent: Patient/Guardian gives verbal consent for treatment and assignment of benefits for services provided during this visit. Patient/Guardian expressed understanding and agreed to proceed.   This note was generated in part or whole with voice recognition software. Voice recognition is usually quite accurate but there are transcription errors that can and very often do occur. I apologize for any typographical errors that were not detected and corrected.    Jomarie Longs, MD 12/27/2023, 9:15 AM

## 2024-01-17 ENCOUNTER — Other Ambulatory Visit: Payer: Self-pay | Admitting: Family Medicine

## 2024-01-17 ENCOUNTER — Ambulatory Visit: Payer: BC Managed Care – PPO | Admitting: Psychology

## 2024-01-17 DIAGNOSIS — F411 Generalized anxiety disorder: Secondary | ICD-10-CM | POA: Diagnosis not present

## 2024-01-17 DIAGNOSIS — F3342 Major depressive disorder, recurrent, in full remission: Secondary | ICD-10-CM | POA: Diagnosis not present

## 2024-01-17 NOTE — Progress Notes (Signed)
 [facility] Counselor/Therapist Progress Note  Patient ID: Brandon Jackson, MRN: [id number]   Date: 01/17/24  Time Spent: 5:03 pm - 5:56 pm: 53 Minutes  Treatment Type: Individual Therapy.  Reported Symptoms: Depression and Anxiety  Mental Status Exam: Appearance:  Neat and Well Groomed     Behavior: Appropriate  Motor: Normal  Speech/Language:  Clear and Coherent  Affect: Congruent  Mood: anxious  Thought process: normal  Thought content:   WNL  Sensory/Perceptual disturbances:   WNL  Orientation: oriented to person, place, time/date, and situation  Attention: Good  Concentration: Good  Memory: WNL  Fund of knowledge:  Good  Insight:   Good  Judgment:  Good  Impulse Control: Good   Risk Assessment: Danger to Self:  No Self-injurious Behavior: No Danger to Others: No Duty to Warn:no Physical Aggression / Violence:No  Access to Firearms a concern: No  Gang Involvement:No   Subjective:   [name] participated from home, via video, is aware of the limitations of tele-sessions, and consented to treatment. Therapist participated from home office. Jhovanny reviewed the events of the past week. Taryll noted recently starting a new job. He noted starting this week and doing the on-boarding process. He noted the transition being stressful due to having to wrap up work at his old position prior to starting the new job. He noted this being taxing and did not allow for as much preparation to transitioning to the new job. Jamesrobert noted that the responsibilities, for the new position, being more than he anticipated and was lead to believe. He noted feeling "a little nervous" about specific duties. He did highlight the positives, thus far, of the experience. Therapist encouraged Korver to continued adopting this perspective. He noted a need to identify boundaries for self in relation to work and home. He noted a need to not be a "perfectionist" as he has  before. He noted not being "good at it" and noted being too available to work. He noted feeling guilt and "intrusive thoughts about work waiting on me". Therapist encouraged Malikiah to identify concrete boundaries, for self and others. We worked on exploring this during the session. Therapist encouraged Shawne to write down his boundaries and to check in on this on a weekly basis. We worked on reframing his perspective about work and work Counselling psychologist. He noted attempting to practice gratitude more consistently. We discussed the benefits of this, during the session, and the importance of doing this consistently. He noted his relationship going well but noted that "politics will be a struggle". He noted this possibly being a "values issues". We explored this during the session.  He noted the difficulty discussing these concerns with his partner and noted that he, Franki, can be a "jerk" about this. We worked on exploring this during the session. Therapist validated Gregrey's feelings and experience. We discussed ways to communicate feedback more adaptively and find commonalities. Therapist modeled this during the session.  Emrick was engaged and motivated during these session. He expressed commitment towards goals. Therapist praised Kamran for his effort during the session and provided supportive therapy. A follow-up was scheduled for continued treatment.    Diagnosis:  MDD (major depressive disorder), recurrent, in full remission (HCC)  GAD (generalized anxiety disorder)  Psychiatric Treatment: Yes , Dr. Elna Breslow. See chart for details.    Treatment Plan:  Client Abilities/Strengths Danny is intelligent, forthcoming, and motivated for change.   Support System: Family and friends.   Client Treatment Preferences Outpatient Therapy.  Client Statement of Needs Dathan would like to  manage overall symptoms, maintain work/home life balance, bolster coping skills, improve self-esteem,  improve boundary setting, and increase mindfulness. He noted too much of his identify  wrapped up in work. Work in relation to core beliefs would also be beneficial.    Treatment Level Weekly  Symptoms  Depression: poor sleep, change in appetite, feeling bad about self, poor concentration, psycho-motor retardation.   (Status: maintained) Anxiety: Feeling anxious, difficulty managing worry, worrying about different things, restlessness, irritability, and feeling afraid something awful might happen.    (Status: maintained)  Goals:   Kervens experiences symptoms of depression and anxiety.   Target Date: 01/21/24 Frequency: Weekly  Progress: 0 Modality: individual    Therapist will provide referrals for additional resources as appropriate.  Therapist will provide psycho-education regarding Jalani's diagnosis and corresponding treatment approaches and interventions. Licensed Clinical Social Worker, [place], LCSW will support the patient's ability to achieve the goals identified. will employ CBT, BA, Problem-solving, Solution Focused, Mindfulness,  coping skills, & other evidenced-based practices will be used to promote progress towards healthy functioning to help manage decrease symptoms associated with his diagnosis.   Reduce overall level, frequency, and intensity of the feelings of depression, anxiety and panic evidenced by decreased overall symptoms from 6 to 7 days/week to 0 to 1 days/week per client report for at least 3 consecutive months. Verbally express understanding of the relationship between feelings of depression, anxiety and their impact on thinking patterns and behaviors. Verbalize an understanding of the role that distorted thinking plays in creating fears, excessive worry, and ruminations.    [name] participated in the creation of the treatment plan)    [name], LCSW

## 2024-01-17 NOTE — Telephone Encounter (Signed)
 Last office visit: 07/07/23 Next office visit: nothing scheduled Last refill: allopurinol (ZYLOPRIM) 100 MG tablet 180 TABLETS 0 REFILLS

## 2024-01-20 ENCOUNTER — Encounter: Payer: Self-pay | Admitting: Family Medicine

## 2024-01-24 DIAGNOSIS — M62838 Other muscle spasm: Secondary | ICD-10-CM | POA: Diagnosis not present

## 2024-01-24 DIAGNOSIS — M6289 Other specified disorders of muscle: Secondary | ICD-10-CM | POA: Diagnosis not present

## 2024-01-24 DIAGNOSIS — R102 Pelvic and perineal pain: Secondary | ICD-10-CM | POA: Diagnosis not present

## 2024-01-24 DIAGNOSIS — N50811 Right testicular pain: Secondary | ICD-10-CM | POA: Diagnosis not present

## 2024-02-06 DIAGNOSIS — M6289 Other specified disorders of muscle: Secondary | ICD-10-CM | POA: Diagnosis not present

## 2024-02-06 DIAGNOSIS — N50811 Right testicular pain: Secondary | ICD-10-CM | POA: Diagnosis not present

## 2024-02-06 DIAGNOSIS — M62838 Other muscle spasm: Secondary | ICD-10-CM | POA: Diagnosis not present

## 2024-02-06 DIAGNOSIS — R102 Pelvic and perineal pain: Secondary | ICD-10-CM | POA: Diagnosis not present

## 2024-02-13 ENCOUNTER — Ambulatory Visit: Payer: BC Managed Care – PPO | Admitting: Psychology

## 2024-02-13 DIAGNOSIS — F3342 Major depressive disorder, recurrent, in full remission: Secondary | ICD-10-CM

## 2024-02-13 DIAGNOSIS — F411 Generalized anxiety disorder: Secondary | ICD-10-CM | POA: Diagnosis not present

## 2024-02-13 NOTE — Progress Notes (Signed)
 [facility] Counselor/Therapist Progress Note  Patient ID: Brandon Jackson, MRN: [id number]   Date: 02/13/24  Time Spent: 4:34 pm - 5:27 pm: 53 Minutes  Treatment Type: Individual Therapy.  Reported Symptoms: Depression and Anxiety  Mental Status Exam: Appearance:  Neat and Well Groomed     Behavior: Appropriate  Motor: Normal  Speech/Language:  Clear and Coherent  Affect: Congruent  Mood: anxious  Thought process: normal  Thought content:   WNL  Sensory/Perceptual disturbances:   WNL  Orientation: oriented to person, place, time/date, and situation  Attention: Good  Concentration: Good  Memory: WNL  Fund of knowledge:  Good  Insight:   Good  Judgment:  Good  Impulse Control: Good   Risk Assessment: Danger to Self:  No Self-injurious Behavior: No Danger to Others: No Duty to Warn:no Physical Aggression / Violence:No  Access to Firearms a concern: No  Gang Involvement:No   Subjective:   Brandon Jackson participated from home, via video, is aware of the limitations of tele-sessions, and consented to treatment. Therapist participated from office. Robb reviewed the events of the past week. Brandon Jackson noted things are "mostly good" and that he is "not trying to stress out about it". He noted that he is "working too much". He noted work related anxiety and noted waking up thinking about work tasks. He noted feeling pressure, which we identified as being internal pressure. He noted, in some ways, "flying blind" in regards to numerous tasks. He noted attempting to get support and advice from the person who previously had position but noted receiving mixed messages. He noted his attempts to create boundaries for self. We worked one exploring this during the session. We worked on identifying ways to be more mindful of needs and boundaries for self and others. Brandon Jackson noted his intent to pursue ADHD testing with his psychiatric provider during his follow-up in May.  He noted his relationship going will, overall, with the avoidance of discussing politics. He noted feeling "dependency" in his relationship. We explored this and other relationship stressors. He noted some weight-gain due to a chronic pain issue and noted his effort to change his diet. Therapist validated Brandon Jackson's feelings and experience, A follow-up was scheduled for continued treatment.    Diagnosis:  MDD (major depressive disorder), recurrent, in full remission (HCC)  GAD (generalized anxiety disorder)  Psychiatric Treatment: Yes , Dr. Elna Breslow. See chart for details.    Treatment Plan:  Client Abilities/Strengths Brandon Jackson is intelligent, forthcoming, and motivated for change.   Support System: Family and friends.   Client Treatment Preferences Outpatient Therapy.   Client Statement of Needs Brandon Jackson would like to  manage overall symptoms, maintain work/home life balance, bolster coping skills, improve self-esteem, improve boundary setting, and increase mindfulness. He noted too much of his identify  wrapped up in work. Work in relation to core beliefs would also be beneficial.    Treatment Level Weekly  Symptoms  Depression: poor sleep, change in appetite, feeling bad about self, poor concentration, psycho-motor retardation.   (Status: maintained) Anxiety: Feeling anxious, difficulty managing worry, worrying about different things, restlessness, irritability, and feeling afraid something awful might happen.    (Status: maintained)  Goals:   Brandon Jackson experiences symptoms of depression and anxiety.   Target Date: 02/21/24 Frequency: Weekly  Progress: 0 Modality: individual    Therapist will provide referrals for additional resources as appropriate.  Therapist will provide psycho-education regarding Brandon Jackson's diagnosis and corresponding treatment approaches and interventions. Licensed Clinical Social Worker, [place],  LCSW will support the patient's ability to achieve  the goals identified. will employ CBT, BA, Problem-solving, Solution Focused, Mindfulness,  coping skills, & other evidenced-based practices will be used to promote progress towards healthy functioning to help manage decrease symptoms associated with his diagnosis.   Reduce overall level, frequency, and intensity of the feelings of depression, anxiety and panic evidenced by decreased overall symptoms from 6 to 7 days/week to 0 to 1 days/week per client report for at least 3 consecutive months. Verbally express understanding of the relationship between feelings of depression, anxiety and their impact on thinking patterns and behaviors. Verbalize an understanding of the role that distorted thinking plays in creating fears, excessive worry, and ruminations.    Brandon Jackson participated in the creation of the treatment plan)    [name], LCSW

## 2024-03-12 ENCOUNTER — Ambulatory Visit: Admitting: Psychology

## 2024-03-12 DIAGNOSIS — F3342 Major depressive disorder, recurrent, in full remission: Secondary | ICD-10-CM | POA: Diagnosis not present

## 2024-03-12 DIAGNOSIS — F411 Generalized anxiety disorder: Secondary | ICD-10-CM
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
## 2024-03-13 ENCOUNTER — Telehealth

## 2024-03-13 DIAGNOSIS — J019 Acute sinusitis, unspecified: Secondary | ICD-10-CM | POA: Diagnosis not present

## 2024-03-13 DIAGNOSIS — B9689 Other specified bacterial agents as the cause of diseases classified elsewhere: Secondary | ICD-10-CM

## 2024-03-13 MED ORDER — AMOXICILLIN-POT CLAVULANATE 875-125 MG PO TABS: 1.0000 | ORAL_TABLET | Freq: Two times a day (BID) | ORAL | 0 refills | Status: DC

## 2024-03-13 NOTE — Progress Notes (Signed)
 Virtual Visit Consent   Brandon Jackson, you are scheduled for a virtual visit with a [facility] provider today. Just as with appointments in the office, your consent must be obtained to participate. Your consent will be active for this visit and any virtual visit you may have with one of our providers in the next 365 days. If you have a MyChart account, a copy of this consent can be sent to you electronically.  As this is a virtual visit, video technology does not allow for your provider to perform a traditional examination. This may limit your provider's ability to fully assess your condition. If your provider identifies any concerns that need to be evaluated in person or the need to arrange testing (such as labs, EKG, etc.), we will make arrangements to do so. Although advances in technology are sophisticated, we cannot ensure that it will always work on either your end or our end. If the connection with a video visit is poor, the visit may have to be switched to a telephone visit. With either a video or telephone visit, we are not always able to ensure that we have a secure connection.  By engaging in this virtual visit, you consent to the provision of healthcare and authorize for your insurance to be billed (if applicable) for the services provided during this visit. Depending on your insurance coverage, you may receive a charge related to this service.  I need to obtain your verbal consent now. Are you willing to proceed with your visit today? Brandon Jackson has provided verbal consent on 03/13/2024 for a virtual visit (video or telephone). Angelia Kelp, PA-C  Date: 03/13/2024 2:07 PM   Virtual Visit via Video Note   I, Angelia Kelp, connected with  Brandon Jackson  ([id number], 11/01/1981) on 03/13/24 at  2:00 PM EDT by a video-enabled telemedicine application and verified that I am speaking with the correct person using two identifiers.  Location: Patient: Virtual  Visit Location Patient: Home Provider: Virtual Visit Location Provider: Home Office   I discussed the limitations of evaluation and management by telemedicine and the availability of in person appointments. The patient expressed understanding and agreed to proceed.    History of Present Illness: Brandon Jackson is a 43 y.o. who identifies as a male who was assigned male at birth, and is being seen today for sinus congestion.  HPI: Sinusitis This is a new problem. The current episode started 1 to 4 weeks ago (2 weeks). The problem has been waxing and waning since onset. There has been no fever. Associated symptoms include congestion, ear pain (left), headaches, sinus pressure (left) and a sore throat (intermittent from post nasal drainage). Pertinent negatives include no chills, coughing, diaphoresis, hoarse voice, shortness of breath or sneezing. (Had thought is was a dental issue, went to dentist and had xray showing inflammation in the left maxillary sinus, not a dental abscess) Past treatments include acetaminophen (tylenol and ibuprofen). The treatment provided no relief.     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  He can tolerate wellbutrin  when taken with lexparo.    Erythromycin Diarrhea and Rash   Medications:  Current Outpatient Medications:    amoxicillin-clavulanate (AUGMENTIN) 875-125 MG tablet, Take 1 tablet by mouth 2 (two) times daily., Disp: 14 tablet, Rfl: 0   valACYclovir  (VALTREX ) 500 MG tablet, TAKE 1 TABLET (500 MG TOTAL) BY MOUTH DAILY., Disp: 90 tablet, Rfl: 1   allopurinol  (ZYLOPRIM ) 100 MG tablet, Take 1 tablet by mouth twice daily., Disp: 180 tablet, Rfl: 2   Ascorbic Acid (VITAMIN C PO), Take by mouth., Disp: , Rfl:    B Complex Vitamins (VITAMIN B COMPLEX PO), Take by mouth., Disp: , Rfl:    buPROPion  ER (WELLBUTRIN  SR) 100 MG 12 hr tablet, Take 1 tablet (100 mg total) by mouth 2 (two) times daily., Disp: 180 tablet, Rfl: 3   busPIRone  (BUSPAR ) 10 MG tablet, Take 2 tablets (20 mg total) by mouth 3 (three) times daily., Disp: 540 tablet, Rfl: 3   cetirizine  (ZYRTEC ) 10 MG tablet, Take 1 tablet (10 mg total) by mouth daily., Disp: , Rfl:    clonazePAM  (KLONOPIN ) 0.5 MG tablet, TAKE 1/2 TO 1 TABLET BY MOUTH TWICE A DAY IF NEEDED FOR ANXIETY AND 1/2 TO 1 TABLET AT NIGHT IF NEEDED FOR INSOMNIA, CAUTION SEDATION., Disp: 20 tablet, Rfl: 1   Cranberry 500 MG CAPS, Take by mouth., Disp: , Rfl:    emtricitabine -tenofovir  (TRUVADA) 200-300 MG tablet, Take 1 tablet by mouth daily., Disp: 90 tablet, Rfl: 1   fluticasone  (FLONASE ) 50 MCG/ACT nasal spray, Place into both nostrils daily., Disp: , Rfl:    hydrOXYzine  (VISTARIL ) 25 MG capsule, Take 1-3 capsules (25-75 mg total) by mouth at bedtime as needed for anxiety., Disp: 270 capsule, Rfl: 0   Lysine 1000 MG TABS, , Disp: , Rfl:    meloxicam  (MOBIC ) 15 MG tablet, Take 1 tablet (15 mg total) by mouth daily., Disp: 90 tablet, Rfl: 1   Multiple Vitamin (MULTIVITAMIN) tablet, Take 1  tablet by mouth daily., Disp: , Rfl:    tadalafil (CIALIS) 5 MG tablet, Take 5 mg by mouth daily., Disp: , Rfl:   Observations/Objective: Patient is well-developed, well-nourished in no acute distress.  Resting comfortably at home.  Head is normocephalic, atraumatic.  No labored breathing.  Speech is clear and coherent with logical content.  Patient is alert and oriented at baseline.    Assessment and Plan: 1. Acute bacterial sinusitis (Primary) - amoxicillin-clavulanate (AUGMENTIN) 875-125 MG tablet; Take 1 tablet by mouth 2 (two) times daily.  Dispense: 14 tablet; Refill: 0  - Worsening symptoms that have not responded to OTC medications.  - Will give Augmentin - Continue allergy medications.  - Steam and humidifier can help - Stay well hydrated and get plenty of rest.  - Seek in person evaluation if no symptom improvement or if symptoms worsen   Follow Up Instructions: I discussed the assessment and treatment plan with the patient. The patient was provided an opportunity to ask questions and all were answered. The patient agreed with the plan  and demonstrated an understanding of the instructions.  A copy of instructions were sent to the patient via MyChart unless otherwise noted below.    The patient was advised to call back or seek an in-person evaluation if the symptoms worsen or if the condition fails to improve as anticipated.    Angelia Kelp, PA-C

## 2024-03-13 NOTE — Patient Instructions (Signed)
 Brandon Jackson, thank you for joining Angelia Kelp, PA-C for today's virtual visit.  While this provider is not your primary care provider (PCP), if your PCP is located in our provider database this encounter information will be shared with them immediately following your visit.   A [facility] MyChart account gives you access to today's visit and all your visits, tests, and labs performed at [facility] " click here if you don't have a [facility] MyChart account or go to [email or web address]  Consent: (Patient) Brandon Jackson provided verbal consent for this virtual visit at the beginning of the encounter.  Current Medications:  Current Outpatient Medications:    amoxicillin-clavulanate (AUGMENTIN) 875-125 MG tablet, Take 1 tablet by mouth 2 (two) times daily., Disp: 14 tablet, Rfl: 0   valACYclovir  (VALTREX ) 500 MG tablet, TAKE 1 TABLET (500 MG TOTAL) BY MOUTH DAILY., Disp: 90 tablet, Rfl: 1   allopurinol  (ZYLOPRIM ) 100 MG tablet, Take 1 tablet by mouth twice daily., Disp: 180 tablet, Rfl: 2   Ascorbic Acid (VITAMIN C PO), Take by mouth., Disp: , Rfl:    B Complex Vitamins (VITAMIN B COMPLEX PO), Take by mouth., Disp: , Rfl:    buPROPion  ER (WELLBUTRIN  SR) 100 MG 12 hr tablet, Take 1 tablet (100 mg total) by mouth 2 (two) times daily., Disp: 180 tablet, Rfl: 3   busPIRone  (BUSPAR ) 10 MG tablet, Take 2 tablets (20 mg total) by mouth 3 (three) times daily., Disp: 540 tablet, Rfl: 3   cetirizine  (ZYRTEC ) 10 MG tablet, Take 1 tablet (10 mg total) by mouth daily., Disp: , Rfl:    clonazePAM  (KLONOPIN ) 0.5 MG tablet, TAKE 1/2 TO 1 TABLET BY MOUTH TWICE A DAY IF NEEDED FOR ANXIETY AND 1/2 TO 1 TABLET AT NIGHT IF NEEDED FOR INSOMNIA, CAUTION SEDATION., Disp: 20 tablet, Rfl: 1   Cranberry 500 MG CAPS, Take by mouth., Disp: , Rfl:    emtricitabine -tenofovir  (TRUVADA) 200-300 MG tablet, Take 1 tablet by mouth daily., Disp: 90 tablet, Rfl: 1   fluticasone  (FLONASE ) 50  MCG/ACT nasal spray, Place into both nostrils daily., Disp: , Rfl:    hydrOXYzine  (VISTARIL ) 25 MG capsule, Take 1-3 capsules (25-75 mg total) by mouth at bedtime as needed for anxiety., Disp: 270 capsule, Rfl: 0   Lysine 1000 MG TABS, , Disp: , Rfl:    meloxicam  (MOBIC ) 15 MG tablet, Take 1 tablet (15 mg total) by mouth daily., Disp: 90 tablet, Rfl: 1   Multiple Vitamin (MULTIVITAMIN) tablet, Take 1 tablet by mouth daily., Disp: , Rfl:    tadalafil (CIALIS) 5 MG tablet, Take 5 mg by mouth daily., Disp: , Rfl:    Medications ordered in this encounter:  Meds ordered this encounter  Medications   amoxicillin-clavulanate (AUGMENTIN) 875-125 MG tablet    Sig: Take 1 tablet by mouth 2 (two) times daily.    Dispense:  14 tablet    Refill:  0    Supervising Provider:   Corine Dice [[id number]]     *If you need refills on other medications prior to your next appointment, please contact your pharmacy*  Follow-Up: Call back or seek an in-person evaluation if the symptoms worsen or if the condition fails to improve as anticipated.  [facility] [phone]  Other Instructions Sinus Infection, Adult A sinus infection, also called sinusitis, is inflammation of your sinuses. Sinuses are hollow spaces in the bones around your face. Your sinuses are located: Around your eyes. In the middle  of your forehead. Behind your nose. In your cheekbones. Mucus normally drains out of your sinuses. When your nasal tissues become inflamed or swollen, mucus can become trapped or blocked. This allows bacteria, viruses, and fungi to grow, which leads to infection. Most infections of the sinuses are caused by a virus. A sinus infection can develop quickly. It can last for up to 4 weeks (acute) or for more than 12 weeks (chronic). A sinus infection often develops after a cold. What are the causes? This condition is caused by anything that creates swelling in the sinuses or stops mucus from  draining. This includes: Allergies. Asthma. Infection from bacteria or viruses. Deformities or blockages in your nose or sinuses. Abnormal growths in the nose (nasal polyps). Pollutants, such as chemicals or irritants in the air. Infection from fungi. This is rare. What increases the risk? You are more likely to develop this condition if you: Have a weak body defense system (immune system). Do a lot of swimming or diving. Overuse nasal sprays. Smoke. What are the signs or symptoms? The main symptoms of this condition are pain and a feeling of pressure around the affected sinuses. Other symptoms include: Stuffy nose or congestion that makes it difficult to breathe through your nose. Thick yellow or greenish drainage from your nose. Tenderness, swelling, and warmth over the affected sinuses. A cough that may get worse at night. Decreased sense of smell and taste. Extra mucus that collects in the throat or the back of the nose (postnasal drip) causing a sore throat or bad breath. Tiredness (fatigue). Fever. How is this diagnosed? This condition is diagnosed based on: Your symptoms. Your medical history. A physical exam. Tests to find out if your condition is acute or chronic. This may include: Checking your nose for nasal polyps. Viewing your sinuses using a device that has a light (endoscope). Testing for allergies or bacteria. Imaging tests, such as an MRI or CT scan. In rare cases, a bone biopsy may be done to rule out more serious types of fungal sinus disease. How is this treated? Treatment for a sinus infection depends on the cause and whether your condition is chronic or acute. If caused by a virus, your symptoms should go away on their own within 10 days. You may be given medicines to relieve symptoms. They include: Medicines that shrink swollen nasal passages (decongestants). A spray that eases inflammation of the nostrils (topical intranasal corticosteroids). Rinses  that help get rid of thick mucus in your nose (nasal saline washes). Medicines that treat allergies (antihistamines). Over-the-counter pain relievers. If caused by bacteria, your health care provider may recommend waiting to see if your symptoms improve. Most bacterial infections will get better without antibiotic medicine. You may be given antibiotics if you have: A severe infection. A weak immune system. If caused by narrow nasal passages or nasal polyps, surgery may be needed. Follow these instructions at home: Medicines Take, use, or apply over-the-counter and prescription medicines only as told by your health care provider. These may include nasal sprays. If you were prescribed an antibiotic medicine, take it as told by your health care provider. Do not stop taking the antibiotic even if you start to feel better. Hydrate and humidify  Drink enough fluid to keep your urine pale yellow. Staying hydrated will help to thin your mucus. Use a cool mist humidifier to keep the humidity level in your home above 50%. Inhale steam for 10-15 minutes, 3-4 times a day, or as told by your  health care provider. You can do this in the bathroom while a hot shower is running. Limit your exposure to cool or dry air. Rest Rest as much as possible. Sleep with your head raised (elevated). Make sure you get enough sleep each night. General instructions  Apply a warm, moist washcloth to your face 3-4 times a day or as told by your health care provider. This will help with discomfort. Use nasal saline washes as often as told by your health care provider. Wash your hands often with soap and water to reduce your exposure to germs. If soap and water are not available, use hand sanitizer. Do not smoke. Avoid being around people who are smoking (secondhand smoke). Keep all follow-up visits. This is important. Contact a health care provider if: You have a fever. Your symptoms get worse. Your symptoms do not  improve within 10 days. Get help right away if: You have a severe headache. You have persistent vomiting. You have severe pain or swelling around your face or eyes. You have vision problems. You develop confusion. Your neck is stiff. You have trouble breathing. These symptoms may be an emergency. Get help right away. Call 911. Do not wait to see if the symptoms will go away. Do not drive yourself to the hospital. Summary A sinus infection is soreness and inflammation of your sinuses. Sinuses are hollow spaces in the bones around your face. This condition is caused by nasal tissues that become inflamed or swollen. The swelling traps or blocks the flow of mucus. This allows bacteria, viruses, and fungi to grow, which leads to infection. If you were prescribed an antibiotic medicine, take it as told by your health care provider. Do not stop taking the antibiotic even if you start to feel better. Keep all follow-up visits. This is important. This information is not intended to replace advice given to you by your health care provider. Make sure you discuss any questions you have with your health care provider. Document Revised: 09/29/2021 Document Reviewed: 09/29/2021 Elsevier Patient Education  2024 [employer]   If you have been instructed to have an in-person evaluation today at a local Urgent Care facility, please use the link below. It will take you to a list of all of our available [facility], including address, phone number and hours of operation. Please do not delay care.  [facility]  If you or a family member do not have a primary care provider, use the link below to schedule a visit and establish care. When you choose a [facility] primary care physician or advanced practice provider, you gain a long-term partner in health. Find a Primary Care Provider  Learn more about [facility]'s in-office and virtual care options: [facility] - Get Care Now

## 2024-03-19 ENCOUNTER — Encounter: Payer: Self-pay | Admitting: Psychiatry

## 2024-03-19 ENCOUNTER — Telehealth: Payer: BC Managed Care – PPO | Admitting: Psychiatry

## 2024-03-19 DIAGNOSIS — F5105 Insomnia due to other mental disorder: Secondary | ICD-10-CM

## 2024-03-19 DIAGNOSIS — R41840 Attention and concentration deficit: Secondary | ICD-10-CM | POA: Diagnosis not present

## 2024-03-19 DIAGNOSIS — F3342 Major depressive disorder, recurrent, in full remission: Secondary | ICD-10-CM | POA: Diagnosis not present

## 2024-03-19 DIAGNOSIS — F411 Generalized anxiety disorder: Secondary | ICD-10-CM | POA: Diagnosis not present

## 2024-03-19 NOTE — Progress Notes (Addendum)
 Virtual Visit via Video Note  I connected with Brandon Jackson on 03/19/24 at  4:00 PM EDT by a video enabled telemedicine application and verified that I am speaking with the correct person using two identifiers.  Location Provider Location : [facility] Patient Location : Home  Participants: Patient , Provider    I discussed the limitations of evaluation and management by telemedicine and the availability of in person appointments. The patient expressed understanding and agreed to proceed.   I discussed the assessment and treatment plan with the patient. The patient was provided an opportunity to ask questions and all were answered. The patient agreed with the plan and demonstrated an understanding of the instructions.   The patient was advised to call back or seek an in-person evaluation if the symptoms worsen or if the condition fails to improve as anticipated.   BH MD OP Progress Note  03/19/2024 4:32 PM TAIDEN MARCHANT  MRN:  [id number]  Chief Complaint:  Chief Complaint  Patient presents with   Follow-up   Anxiety   Depression   Medication Refill   Discussed the use of AI scribe software for clinical note transcription with the patient, who gave verbal consent to proceed.  History of Present Illness Brandon Jackson is a 43 year old Caucasian male currently employed, lives in [place], has a history of MDD, GAD, hyperlipidemia, insomnia was evaluated by telemedicine today.  His depression symptoms have improved significantly, with no current symptoms of depression. Anxiety levels have decreased, particularly outside of work, leading to reduced stress. However, he continues to experience sleep disturbances, often waking up in the middle of the night. He occasionally uses hydroxyzine  to aid sleep but finds it 'not luxuriously restful'. He has tried various techniques to improve sleep, such as listening to podcasts.  He recently experienced a stomach bug that further  disrupted his sleep. Additionally, he is dealing with a sinus infection for which he is taking Augmentin , with the course nearly completed.  He is concerned about potential ADHD symptoms, noting difficulty focusing, completing tasks, and maintaining attention on activities such as reading, watching movies, or listening to music. These issues have been noticeable over the past few years, although he did not experience them as a child. He describes days where it is challenging to stay focused and complete tasks, particularly at work.  He is interested in a referral for testing.  In terms of social history, he has recently changed job locations, which he finds to be a much better environment compared to his previous toxic work setting. However, he is adjusting to a new work-life balance with more night and weekend commitments than anticipated. 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  Denies thoughts of self-harm or suicide attempts. Appetite is generally okay, though he is mindful of avoiding mindless snacking at night.  Denies any other concerns today.    Visit Diagnosis:    ICD-10-CM   1. MDD (major depressive disorder), recurrent, in full remission (HCC)  F33.42     2. GAD (generalized anxiety disorder)  F41.1     3. Insomnia due to other mental disorder  F51.05    F99    Anxiety, depression    4. Attention and concentration deficit  R41.840 Ambulatory referral to Neuropsychology      Past Psychiatric History: I have reviewed past psychiatric history from progress note on 12/23/2021.Pristiq , Duloxetine,Lexapro ,Lunesta ,Effexor  multiple other medications.  Past Medical History:  Past Medical History:  Diagnosis Date  Anxiety    Depression    Frequent headaches    Gout    History of chicken pox    Migraines    Ulcer     Past Surgical History:  Procedure Laterality Date   CYSTECTOMY  2003   TONSILLECTOMY AND ADENOIDECTOMY  2007   Wisdom Teeth Removal   1998    Family Psychiatric History: I have reviewed family psychiatric history from progress note on 12/23/2021.  Family History:  Family History  Problem Relation Age of Onset   Depression Mother    Diabetes Father    Hyperlipidemia Father    Hypertension Father    Colon cancer Neg Hx    Prostate cancer Neg Hx     Social History: I have reviewed social history from progress note on 12/23/2021. Social History   Socioeconomic History   Marital status: Significant Other    Spouse name: Not on file   Number of children: Not on file   Years of education: College   Highest education level: Not on file  Occupational History   Occupation: communications    Employer: [employer]  Tobacco Use   Smoking status: Former    Current packs/day: 0.00    Types: Cigarettes    Quit date: 2013    Years since quitting: 12.3    Passive exposure: Past   Smokeless tobacco: Never  Vaping Use   Vaping status: Never Used  Substance and Sexual Activity   Alcohol use: Yes    Comment: occasional   Drug use: No   Sexual activity: Not on file  Other Topics Concern   Not on file  Social History Narrative   Single   Was in committed relationship 2007-2023.   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     Social Drivers of Corporate investment banker Strain: Not on file  Food Insecurity: Not on file  Transportation Needs: Not on file  Physical Activity: Not on file  Stress: Not on file  Social Connections: Not on file    Allergies:  Allergies  Allergen Reactions   Wellbutrin  [Bupropion ] Other (See Comments)    Anxiety increased on med when taken alone.  He can tolerate wellbutrin  when taken with lexparo.    Erythromycin Diarrhea and Rash    Metabolic Disorder Labs: No results found for: "HGBA1C", "MPG" No results found for: "PROLACTIN" Lab Results  Component Value Date   CHOL 154 05/17/2022   TRIG 111.0 05/17/2022   HDL 49.60 05/17/2022    CHOLHDL 3 05/17/2022   VLDL 22.2 05/17/2022   LDLCALC 82 05/17/2022   LDLCALC 82 10/12/2021   Lab Results  Component Value Date   TSH 1.24 05/17/2022   TSH 1.310 01/05/2022    Therapeutic Level Labs: No results found for: "LITHIUM" No results found for: "VALPROATE" No results found for: "CBMZ"  Current Medications: Current Outpatient Medications  Medication Sig Dispense Refill   valACYclovir  (VALTREX ) 500 MG tablet TAKE 1 TABLET (500 MG TOTAL) BY MOUTH DAILY. 90 tablet 1   allopurinol  (ZYLOPRIM ) 100 MG tablet Take 1 tablet by mouth twice daily. 180 tablet 2   amoxicillin -clavulanate (AUGMENTIN ) 875-125 MG tablet Take 1 tablet by mouth 2 (two) times daily. 14 tablet 0   Ascorbic Acid (VITAMIN C PO) Take by mouth.     B Complex Vitamins (VITAMIN B COMPLEX PO) Take by mouth.     buPROPion  ER (WELLBUTRIN  SR) 100 MG 12 hr  tablet Take 1 tablet (100 mg total) by mouth 2 (two) times daily. 180 tablet 3   busPIRone  (BUSPAR ) 10 MG tablet Take 2 tablets (20 mg total) by mouth 3 (three) times daily. 540 tablet 3   cetirizine  (ZYRTEC ) 10 MG tablet Take 1 tablet (10 mg total) by mouth daily.     clonazePAM  (KLONOPIN ) 0.5 MG tablet TAKE 1/2 TO 1 TABLET BY MOUTH TWICE A DAY IF NEEDED FOR ANXIETY AND 1/2 TO 1 TABLET AT NIGHT IF NEEDED FOR INSOMNIA, CAUTION SEDATION. 20 tablet 1   Cranberry 500 MG CAPS Take by mouth.     emtricitabine -tenofovir  (TRUVADA) 200-300 MG tablet Take 1 tablet by mouth daily. 90 tablet 1   fluticasone  (FLONASE ) 50 MCG/ACT nasal spray Place into both nostrils daily.     hydrOXYzine  (VISTARIL ) 25 MG capsule Take 1-3 capsules (25-75 mg total) by mouth at bedtime as needed for anxiety. 270 capsule 0   Lysine 1000 MG TABS      meloxicam  (MOBIC ) 15 MG tablet Take 1 tablet (15 mg total) by mouth daily. 90 tablet 1   Multiple Vitamin (MULTIVITAMIN) tablet Take 1 tablet by mouth daily.     tadalafil (CIALIS) 5 MG tablet Take 5 mg by mouth daily.     No current  facility-administered medications for this visit.     Musculoskeletal: Strength & Muscle Tone: UTA Gait & Station: Seated Patient leans: N/A  Psychiatric Specialty Exam: Review of Systems  Psychiatric/Behavioral:  Positive for decreased concentration and sleep disturbance. The patient is nervous/anxious.     There were no vitals taken for this visit.There is no height or weight on file to calculate BMI.  General Appearance: Casual  Eye Contact:  Fair  Speech:  Clear and Coherent  Volume:  Normal  Mood:  Anxious improving  Affect:  Congruent  Thought Process:  Goal Directed and Descriptions of Associations: Intact  Orientation:  Full (Time, Place, and Person)  Thought Content: Logical   Suicidal Thoughts:  No  Homicidal Thoughts:  No  Memory:  Immediate;   Fair Recent;   Fair Remote;   Fair  Judgement:  Fair  Insight:  Fair  Psychomotor Activity:  Normal  Concentration:  Concentration: Fair and Attention Span: Fair  Recall:  [employer] of Knowledge: Fair  Language: Fair  Akathisia:  No  Handed:  Right  AIMS (if indicated): not done  Assets:  Communication Skills Desire for Improvement Housing Social Support Talents/Skills  ADL's:  Intact  Cognition: WNL  Sleep:  Varies   Screenings: AIMS    Flowsheet Row Video Visit from [facility]/03/2022 in [facility]  AIMS Total Score 0      GAD-7    Flowsheet Row Office Visit from 09/26/2023 in [facility] Office Visit from 07/07/2023 in [facility] Office Visit from 06/14/2023 in [facility] Video Visit from 03/03/2023 in [facility] Office Visit from 01/18/2023 in [facility]  Total GAD-7 Score 7 14 6 8 17       PHQ2-9    Flowsheet Row Office Visit from 09/26/2023 in [facility] Office Visit from 07/07/2023 in [facility] Office Visit from 06/14/2023 in [facility] Video Visit from 03/03/2023 in [facility] Office Visit from 01/18/2023 in [facility]  PHQ-2 Total Score 0 2 2 1 2   PHQ-9 Total Score -- 9 8 6 9       Flowsheet Row Video Visit from 03/19/2024 in [facility] Video Visit from 12/26/2023 in [facility] Office Visit from 09/26/2023 in [facility]  C-SSRS RISK CATEGORY No Risk No Risk No Risk        Assessment and Plan: DMIR RIEF is a 43 year old Caucasian male, employed, lives in [place], has a history of MDD, GAD was evaluated by telemedicine today.  Discussed assessment and plan as noted below.  Generalized anxiety disorder-stable Higinio reports anxiety symptoms as currently improved on the current medication regimen. - Continue BuSpar  20 mg 3 times daily - Continue Wellbutrin  100 mg twice daily - Continue Clonazepam  0.5 mg as needed for racing thoughts before bed. - Continue Hydroxyzine  25-75 mg at bedtime as needed - Continue CBT with Mr. Belva Boyden.  Depression in remission Currently reports depression symptoms as stable.  Denies any new concerns. - Continue BuSpar  20 mg 3 times a day - Continue Wellbutrin  100 mg twice daily. - Continue psychotherapy sessions.  Insomnia-unstable Currently working on sleep hygiene and has episodes of sleep issues .  Currently not interested in addition of his new sleep medication since when he takes hydroxyzine  if sleep is interrupted that does seem to help.  Would like to continue doing the same. - Continue sleep hygiene techniques. - Continue Hydroxyzine  25 mg - 75 mg at bedtime if sleep issues persist. -  Continue Klonopin  0.5 mg as needed for severe insomnia. - Reviewed [place] PMP AWARxE   Attention and concentration deficit - Ambulatory referral to neuropsychologist per patient request.  Follow-up Follow-up in clinic in 3 months or sooner if needed.      Collaboration of Care: Collaboration of Care: Referral or follow-up with counselor/therapist AEB encouraged to continue psychotherapy sessions.  Patient/Guardian was advised Release of Information must be obtained prior to any record release in order to collaborate their care with an outside provider. Patient/Guardian was advised if they have not already done so to contact the registration department to sign all necessary forms in order for us  to release information regarding their care.   Consent: Patient/Guardian gives verbal consent for treatment and assignment of benefits for services provided during this visit. Patient/Guardian expressed understanding and agreed to proceed.   This note was generated in part or whole with voice recognition software. Voice recognition is usually quite accurate but there are transcription errors that can and very often do occur. I apologize for any typographical errors that were not detected and corrected.    Christie Copley, MD 03/19/2024, 4:32 PM

## 2024-03-19 NOTE — Addendum Note (Signed)
 Addended byPearl Bottcher on: 03/19/2024 04:32 PM   Modules accepted: Orders

## 2024-03-27 ENCOUNTER — Ambulatory Visit (INDEPENDENT_AMBULATORY_CARE_PROVIDER_SITE_OTHER): Payer: Self-pay | Admitting: Oncology

## 2024-03-27 DIAGNOSIS — N50811 Right testicular pain: Secondary | ICD-10-CM

## 2024-03-27 MED ORDER — LEVOFLOXACIN 500 MG PO TABS: 500.0000 mg | ORAL_TABLET | Freq: Every day | ORAL | 0 refills | Status: AC

## 2024-03-27 NOTE — Progress Notes (Signed)
 I connected with Brandon Jackson on 03/27/24 at 11:00 AM EDT by telephone visit and verified that I am speaking with the correct person using two identifiers.   I discussed the limitations, risks, security and privacy concerns of performing an evaluation and management service by telemedicine and the availability of in-person appointments. I also discussed with the patient that there may be a patient responsible charge related to this service. The patient expressed understanding and agreed to proceed.   Other persons participating in the visit and their role in the encounter: NP, Patient    Patient's location: Home  Provider's location: Clinic    HPI Pt is here for follow up on chronic epididymitis.  Reports initial diagnosis back in July 2024 and treated with Bactrim  initially without improvement and eventually placed on Levaquin  500 mg daily x 10 days.  He has had intermittent flares throughout the year last flare was in October.  He has been seen by urology.  Reports he developed symptoms to his right testicle this morning with gradual intensity of the pain.  Rates it a 5 out of 10.  It is aggravated by standing, sitting, and most movements.  He does have a 30-minute commute to work which can be awful at times and is driving to [place] later this evening.  Also with his current role here at [employer], his activity level is high for this pain.  He has had a negative ultrasound as well as negative STI testing.  He describes his pain currently as "like a bruise".    He was recently treated for sinusitis by his PCP on 03/13/2024 with Augmentin .  Believes sinus infection has fully resolved.  Current Medication:  Outpatient Encounter Medications as of 03/27/2024  Medication Sig   levofloxacin  (LEVAQUIN ) 500 MG tablet Take 1 tablet (500 mg total) by mouth daily for 10 days.   valACYclovir  (VALTREX ) 500 MG tablet TAKE 1 TABLET (500 MG TOTAL) BY MOUTH DAILY.   allopurinol  (ZYLOPRIM ) 100 MG tablet Take 1  tablet by mouth twice daily.   amoxicillin -clavulanate (AUGMENTIN ) 875-125 MG tablet Take 1 tablet by mouth 2 (two) times daily.   Ascorbic Acid (VITAMIN C PO) Take by mouth.   B Complex Vitamins (VITAMIN B COMPLEX PO) Take by mouth.   buPROPion  ER (WELLBUTRIN  SR) 100 MG 12 hr tablet Take 1 tablet (100 mg total) by mouth 2 (two) times daily.   busPIRone  (BUSPAR ) 10 MG tablet Take 2 tablets (20 mg total) by mouth 3 (three) times daily.   cetirizine  (ZYRTEC ) 10 MG tablet Take 1 tablet (10 mg total) by mouth daily.   clonazePAM  (KLONOPIN ) 0.5 MG tablet TAKE 1/2 TO 1 TABLET BY MOUTH TWICE A DAY IF NEEDED FOR ANXIETY AND 1/2 TO 1 TABLET AT NIGHT IF NEEDED FOR INSOMNIA, CAUTION SEDATION.   Cranberry 500 MG CAPS Take by mouth.   emtricitabine -tenofovir  (TRUVADA) 200-300 MG tablet Take 1 tablet by mouth daily.   fluticasone  (FLONASE ) 50 MCG/ACT nasal spray Place into both nostrils daily.   Lysine 1000 MG TABS    meloxicam  (MOBIC ) 15 MG tablet Take 1 tablet (15 mg total) by mouth daily.   Multiple Vitamin (MULTIVITAMIN) tablet Take 1 tablet by mouth daily.   tadalafil (CIALIS) 5 MG tablet Take 5 mg by mouth daily.   No facility-administered encounter medications on file as of 03/27/2024.      Medical History: Past Medical History:  Diagnosis Date   Anxiety    Depression    Frequent headaches  Gout    History of chicken pox    Migraines    Ulcer      Vital Signs: There were no vitals taken for this visit.   Review of Systems  Genitourinary:  Positive for testicular pain.    Physical Exam Neurological:     Mental Status: He is alert and oriented to person, place, and time.    Assessment/Plan: 1. Testicular pain, right Previously Levaquin  500 mg daily x 10 days has worked well for him. He has follow-up with his PCP on Thursday. Given he will have close follow-up within the next couple of days with PCP, we will go ahead and send prescription for Levaquin .  He will be driving to  [place] and the pain is fairly significant.  He is currently working from home in [place] and was unable to get an appointment with his PCP today. No red flag symptoms.  Reviewed ED precautions with him.     General Counseling: reign bartnick understanding of the findings of todays visit and agrees with plan of treatment. I have discussed any further diagnostic evaluation that may be needed or ordered today. We also reviewed his medications today. he has been encouraged to call the office with any questions or concerns that should arise related to todays visit.   No orders of the defined types were placed in this encounter.   Meds ordered this encounter  Medications   levofloxacin  (LEVAQUIN ) 500 MG tablet    Sig: Take 1 tablet (500 mg total) by mouth daily for 10 days.    Dispense:  10 tablet    Refill:  0   I provided 20 minutes of non face-to-face telephone visit time during this encounter, and > 50% was spent counseling as documented under my assessment & plan.   Charlton Cooler, NP 03/27/2024 12:06 PM

## 2024-03-28 DIAGNOSIS — M9906 Segmental and somatic dysfunction of lower extremity: Secondary | ICD-10-CM | POA: Diagnosis not present

## 2024-03-28 DIAGNOSIS — M5416 Radiculopathy, lumbar region: Secondary | ICD-10-CM | POA: Diagnosis not present

## 2024-03-28 DIAGNOSIS — M9903 Segmental and somatic dysfunction of lumbar region: Secondary | ICD-10-CM | POA: Diagnosis not present

## 2024-03-28 DIAGNOSIS — M9905 Segmental and somatic dysfunction of pelvic region: Secondary | ICD-10-CM | POA: Diagnosis not present

## 2024-03-29 ENCOUNTER — Encounter: Payer: Self-pay | Admitting: Family Medicine

## 2024-03-29 ENCOUNTER — Ambulatory Visit: Admitting: Family Medicine

## 2024-03-29 VITALS — BP 132/84 | HR 73 | Temp 98.4°F | Ht 67.5 in | Wt 213.8 lb

## 2024-03-29 DIAGNOSIS — N50811 Right testicular pain: Secondary | ICD-10-CM

## 2024-03-29 DIAGNOSIS — N451 Epididymitis: Secondary | ICD-10-CM

## 2024-03-29 DIAGNOSIS — F411 Generalized anxiety disorder: Secondary | ICD-10-CM | POA: Diagnosis not present

## 2024-03-29 DIAGNOSIS — M549 Dorsalgia, unspecified: Secondary | ICD-10-CM | POA: Diagnosis not present

## 2024-03-29 MED ORDER — MELOXICAM 15 MG PO TABS
15.0000 mg | ORAL_TABLET | ORAL | Status: AC | PRN
Start: 2024-03-29 — End: ?

## 2024-03-29 MED ORDER — PREDNISONE 10 MG PO TABS: ORAL_TABLET | ORAL | 0 refills | Status: DC

## 2024-03-29 NOTE — Progress Notes (Unsigned)
 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  His situation is better.  Tapered klonopin  use with improved situation. Still see counseling and psych at baseline.    He is in PT at baseline for pelvic pain. Pain is generally improved.  Pain is in the R groin.  He is having less bad days compared to prior.  Then had progressive testicle pain two days ago.  Has eval and improved in the meantime- recently on levaquin  per outside clinic.  He had recent URI sx with L sided congestion, discomfort.    Around 02/26/24, had tooth pain, dental eval, thought to have sinus infection and tx'd with abx at the time.  Groin pain improved while on abx.    L lower back pain, separate from the above, h/o spasms.  About 10 days ago, felt a spasm in his back.  He was careful about lifting in the meantime, then had pain in the L lower back and down the upper part of the L leg.  Went to chiropractor yesterday.  Using ice and heat.  He is going back to chiropractor and d/w pt about getting HEP.  Normal BLE sensation, no weakness.    Meds, vitals, and allergies reviewed.   ROS: Per HPI unless specifically indicated in ROS section   L greater troch not ttp

## 2024-03-29 NOTE — Patient Instructions (Addendum)
 Take meloxicam  with food, separately from truvada.  If not getting better, then change to prednisone with food.  Update me as needed.  Take care.  Glad to see you. Please schedule a yearly visit with labs ahead of time, when convenient this summer or fall.

## 2024-04-02 DIAGNOSIS — M549 Dorsalgia, unspecified: Secondary | ICD-10-CM | POA: Insufficient documentation

## 2024-04-02 NOTE — Assessment & Plan Note (Signed)
 I think it makes sense to finish Levaquin  as prescribed.  If he did have sinusitis this should concurrently cover it, though I would not expect the same pathogen for both.  Discussed.

## 2024-04-02 NOTE — Assessment & Plan Note (Signed)
 At this point likely benign musculoskeletal source and discussed options. Take meloxicam  with food, separately from truvada.  If not getting better, then change to prednisone with food.  Steroid cautions discussed with patient. Update me as needed.  He is going to follow-up with a chiropractor.

## 2024-04-02 NOTE — Assessment & Plan Note (Signed)
 His situation improved at work any taper Klonopin  in the meantime.  Continue as is.  Update me as needed.

## 2024-04-04 DIAGNOSIS — M9906 Segmental and somatic dysfunction of lower extremity: Secondary | ICD-10-CM | POA: Diagnosis not present

## 2024-04-04 DIAGNOSIS — M9905 Segmental and somatic dysfunction of pelvic region: Secondary | ICD-10-CM | POA: Diagnosis not present

## 2024-04-04 DIAGNOSIS — M9903 Segmental and somatic dysfunction of lumbar region: Secondary | ICD-10-CM | POA: Diagnosis not present

## 2024-04-04 DIAGNOSIS — M5416 Radiculopathy, lumbar region: Secondary | ICD-10-CM | POA: Diagnosis not present

## 2024-04-11 ENCOUNTER — Other Ambulatory Visit: Payer: Self-pay | Admitting: Psychiatry

## 2024-04-11 DIAGNOSIS — F411 Generalized anxiety disorder: Secondary | ICD-10-CM

## 2024-04-11 DIAGNOSIS — M9906 Segmental and somatic dysfunction of lower extremity: Secondary | ICD-10-CM | POA: Diagnosis not present

## 2024-04-11 DIAGNOSIS — F5105 Insomnia due to other mental disorder: Secondary | ICD-10-CM

## 2024-04-11 DIAGNOSIS — M9905 Segmental and somatic dysfunction of pelvic region: Secondary | ICD-10-CM | POA: Diagnosis not present

## 2024-04-11 DIAGNOSIS — M5416 Radiculopathy, lumbar region: Secondary | ICD-10-CM | POA: Diagnosis not present

## 2024-04-11 DIAGNOSIS — M9903 Segmental and somatic dysfunction of lumbar region: Secondary | ICD-10-CM | POA: Diagnosis not present

## 2024-04-13 ENCOUNTER — Encounter: Payer: Self-pay | Admitting: Psychology

## 2024-04-16 DIAGNOSIS — M9905 Segmental and somatic dysfunction of pelvic region: Secondary | ICD-10-CM | POA: Diagnosis not present

## 2024-04-16 DIAGNOSIS — M9903 Segmental and somatic dysfunction of lumbar region: Secondary | ICD-10-CM | POA: Diagnosis not present

## 2024-04-16 DIAGNOSIS — M9906 Segmental and somatic dysfunction of lower extremity: Secondary | ICD-10-CM | POA: Diagnosis not present

## 2024-04-16 DIAGNOSIS — M5416 Radiculopathy, lumbar region: Secondary | ICD-10-CM | POA: Diagnosis not present

## 2024-04-24 ENCOUNTER — Encounter: Payer: Self-pay | Admitting: Family Medicine

## 2024-04-24 ENCOUNTER — Ambulatory Visit: Admitting: Psychology

## 2024-04-24 DIAGNOSIS — F3342 Major depressive disorder, recurrent, in full remission: Secondary | ICD-10-CM

## 2024-04-24 DIAGNOSIS — F411 Generalized anxiety disorder: Secondary | ICD-10-CM

## 2024-04-24 NOTE — Progress Notes (Signed)
 [facility] Counselor/Therapist Progress Note  Patient ID: Brandon Jackson, MRN: [id number]   Date: 04/24/24  Time Spent: 5:03  pm - 5:57 pm : 54 Minutes  Treatment Type: Individual Therapy.  Reported Symptoms: Depression and anxiety.   Mental Status Exam: Appearance:  Neat and Well Groomed     Behavior: Appropriate  Motor: Normal  Speech/Language:  Clear and Coherent  Affect: Appropriate  Mood: irritable  Thought process: normal  Thought content:   WNL  Sensory/Perceptual disturbances:   WNL  Orientation: oriented to person, place, time/date, and situation  Attention: Good  Concentration: Good  Memory: WNL  Fund of knowledge:  Good  Insight:   Good  Judgment:  Good  Impulse Control: Good   Risk Assessment: Danger to Self:  No Self-injurious Behavior: No Danger to Others: No Duty to Warn:no Physical Aggression / Violence:No  Access to Firearms a concern: No  Gang Involvement:No   Subjective:   [name] participated from home, via video and consented to treatment. Therapist participated from home office. I discussed the limitations of evaluation and management by telemedicine and the availability of in person appointments. The patient expressed understanding and agreed to proceed. Jerrin reviewed the events of the past week.    We reviewed numerous treatment approaches including CBT, BA, Problem Solving, and Solution focused therapy. Psych-education regarding the Chandler's diagnosis of GAD (generalized anxiety disorder)  MDD (major depressive disorder), recurrent, in full remission (HCC) was provided during the session. We discussed Quentez Lober Rumbold's goals treatment goals which include    KIMONI PAGLIARULO provided verbal approval of the treatment plan.   He noted being really frustrated regarding his boss messaging him during the session and assigning him tasks with short notice that required resources that his boss continues to  neglect providing. We processed this during the session.   Interventions: Psycho-education & Goal Setting.   Diagnosis:  GAD (generalized anxiety disorder)  MDD (major depressive disorder), recurrent, in full remission (HCC)  Psychiatric Treatment: Yes , VIA PCP.    Treatment Plan:  Client Abilities/Strengths Taren is intelligent, self-aware, and motivated for change.   Support System: Family and friends.   Client Treatment Preferences OPT.   Client Statement of Needs Kauan would like to increase assertiveness, advocating for self, engaging in mindfulness, developing home and work-life balance, addressing perfectionism, improving self-talk and identifying positives about self. Additional clinical goals include symptom management, relaxation, self-care, processing events, and bolstering coping skills.   Treatment Level Weekly  Symptoms  Anxiety: Feeling anxiety, difficulty stopping worry, worrying about different things, trouble relaxing, irritability, and feeling afraid something awful might happen.   (Status: maintained) Depression: Loss of interest, fluctuating sleep, fluctuating appetite, feeling bad about self, difficulty concentrating.    (Status: maintained)  Goals:   Janice experiences symptoms of of depression and anxiety.   Treatment plan signed and available on s-drive:   No, pending signature via MyChart.  [name] was sent the treatment plan signature form on 04/24/24.   Target Date: 04/23/25 Frequency: Weekly  Progress: 0 Modality: individual    Therapist will provide referrals for additional resources as appropriate.  Therapist will provide psycho-education regarding Buster's diagnosis and corresponding treatment approaches and interventions. Belva Boyden, LCSW will support the patient's ability to achieve the goals identified. will employ CBT, BA, Problem-solving, Solution Focused, Mindfulness,  coping skills, & other evidenced-based  practices will be used to promote progress towards healthy functioning to help manage decrease symptoms associated with  his diagnosis.   Reduce overall level, frequency, and intensity of the feelings of depression, anxiety and panic evidenced by decreased overall symptoms from 6 to 7 days/week to 0 to 1 days/week per client report for at least 3 consecutive months. Verbally express understanding of the relationship between feelings of depression, anxiety and their impact on thinking patterns and behaviors. Verbalize an understanding of the role that distorted thinking plays in creating fears, excessive worry, and ruminations.    Bambi Lever participated in the creation of the treatment plan)   Belva Boyden, LCSW

## 2024-04-25 ENCOUNTER — Encounter: Payer: Self-pay | Admitting: Psychology

## 2024-04-25 ENCOUNTER — Other Ambulatory Visit: Payer: Self-pay | Admitting: Family Medicine

## 2024-04-25 MED ORDER — CLONAZEPAM 0.5 MG PO TABS: ORAL_TABLET | ORAL | 1 refills | Status: DC

## 2024-04-25 MED ORDER — PREDNISONE 10 MG PO TABS: ORAL_TABLET | ORAL | 0 refills | Status: DC

## 2024-04-30 DIAGNOSIS — M9906 Segmental and somatic dysfunction of lower extremity: Secondary | ICD-10-CM | POA: Diagnosis not present

## 2024-04-30 DIAGNOSIS — M9903 Segmental and somatic dysfunction of lumbar region: Secondary | ICD-10-CM | POA: Diagnosis not present

## 2024-04-30 DIAGNOSIS — M9905 Segmental and somatic dysfunction of pelvic region: Secondary | ICD-10-CM | POA: Diagnosis not present

## 2024-04-30 DIAGNOSIS — M5416 Radiculopathy, lumbar region: Secondary | ICD-10-CM | POA: Diagnosis not present

## 2024-05-21 ENCOUNTER — Encounter: Admitting: Psychology

## 2024-05-21 DIAGNOSIS — M9905 Segmental and somatic dysfunction of pelvic region: Secondary | ICD-10-CM | POA: Diagnosis not present

## 2024-05-21 DIAGNOSIS — M9906 Segmental and somatic dysfunction of lower extremity: Secondary | ICD-10-CM | POA: Diagnosis not present

## 2024-05-21 DIAGNOSIS — M9903 Segmental and somatic dysfunction of lumbar region: Secondary | ICD-10-CM | POA: Diagnosis not present

## 2024-05-21 DIAGNOSIS — M5416 Radiculopathy, lumbar region: Secondary | ICD-10-CM | POA: Diagnosis not present

## 2024-05-22 ENCOUNTER — Ambulatory Visit (INDEPENDENT_AMBULATORY_CARE_PROVIDER_SITE_OTHER): Admitting: Psychology

## 2024-05-22 DIAGNOSIS — F3342 Major depressive disorder, recurrent, in full remission: Secondary | ICD-10-CM | POA: Diagnosis not present

## 2024-05-22 DIAGNOSIS — F411 Generalized anxiety disorder: Secondary | ICD-10-CM | POA: Diagnosis not present

## 2024-05-22 NOTE — Progress Notes (Signed)
 [facility] Counselor/Therapist Progress Note  Patient ID: Brandon Jackson, MRN: [id number]   Date: 05/22/24  Time Spent: 4:04 pm - 4:50 pm : 46 Minutes  Treatment Type: Individual Therapy.  Reported Symptoms: Depression and anxiety.   Mental Status Exam: Appearance:  Neat and Well Groomed     Behavior: Appropriate  Motor: Normal  Speech/Language:  Clear and Coherent  Affect: Appropriate  Mood: irritable  Thought process: normal  Thought content:   WNL  Sensory/Perceptual disturbances:   WNL  Orientation: oriented to person, place, time/date, and situation  Attention: Good  Concentration: Good  Memory: WNL  Fund of knowledge:  Good  Insight:   Good  Judgment:  Good  Impulse Control: Good   Risk Assessment: Danger to Self:  No Self-injurious Behavior: No Danger to Others: No Duty to Warn:no Physical Aggression / Violence:No  Access to Firearms a concern: No  Gang Involvement:No   Subjective:   Brandon Jackson participated from home, via video and consented to treatment. Therapist participated from office. Brandon Jackson reviewed the events of the past week. He noted following up with his supervisor and dicussed various issues and concerns including barriers to Brandon Jackson's own task completion. He noted receiving some clarity regarding his concerns. He noted his effort to maintain a reasonable workload and minimize his anxiety about uncompleted tasks.  He noted having a recent discussion with his partner, regarding politics, and noted this going well. He noted a need to improve his sleep, specifically his sleep habits and noted TV, phone games, communicating via text as being  barriers to going to sleep. He noted this is time that he gets for himself. He noted, at times, drinking a coke in the afternoon, which could contribute to his difficulty with sleep. Therapist reviewed sleep hygiene and discussed the importance setting boundaries for self with time,  eliminating the caffeine intake, and setting times for media consumption at night. . He noted I have always been a night owl and discussed that his parents gave up on getting him to bed at a certain time when he was ~43 years old. He noted there are some nights I just can't shut my brain off. He noted often having racing thoughts but noted frequently this not being anxiety related. He noted that these thoughts could occur during the day if he has little to do including recalling memories, analyzing lyrics, how is this friend doing, as examples. Therapist encouraged Brandon Jackson to continue his consideration of the ADHD evaluation and psycho-education regarding ADHD was provided. Brandon Jackson was engaged and motivated during the session. He expressed commitment towards making behavioral changes to improve sleep. Therapist validated Brandon Jackson feelings and experience and provided supportive therapy. A follow-up was scheduled for continued treatment, which he benefits from.   Interventions: CBT  Diagnosis:  GAD (generalized anxiety disorder)  MDD (major depressive disorder), recurrent, in full remission (HCC)  Psychiatric Treatment: Yes , VIA PCP.    Treatment Plan:  Client Abilities/Strengths Brandon Jackson is intelligent, self-aware, and motivated for change.   Support System: Family and friends.   Client Treatment Preferences OPT.   Client Statement of Needs Brandon Jackson would like to increase assertiveness, advocating for self, engaging in mindfulness, developing home and work-life balance, addressing perfectionism, improving self-talk and identifying positives about self. Additional clinical goals include symptom management, relaxation, self-care, processing events, and bolstering coping skills.   Treatment Level Weekly  Symptoms  Anxiety: Feeling anxiety, difficulty stopping worry, worrying about different things, trouble relaxing, irritability, and feeling  afraid something awful might happen.    (Status: maintained) Depression: Loss of interest, fluctuating sleep, fluctuating appetite, feeling bad about self, difficulty concentrating.    (Status: maintained)  Goals:   Brandon Jackson experiences symptoms of of depression and anxiety.   Treatment plan signed and available on s-drive:   No, pending signature via MyChart.  Brandon Jackson was sent the treatment plan signature form on 04/24/24.   Target Date: 04/23/25 Frequency: Weekly  Progress: 0 Modality: individual    Therapist will provide referrals for additional resources as appropriate.  Therapist will provide psycho-education regarding [name]'s diagnosis and corresponding treatment approaches and interventions. [name], LCSW will support the patient's ability to achieve the goals identified. will employ CBT, BA, Problem-solving, Solution Focused, Mindfulness,  coping skills, & other evidenced-based practices will be used to promote progress towards healthy functioning to help manage decrease symptoms associated with his diagnosis.   Reduce overall level, frequency, and intensity of the feelings of depression, anxiety and panic evidenced by decreased overall symptoms from 6 to 7 days/week to 0 to 1 days/week per client report for at least 3 consecutive months. Verbally express understanding of the relationship between feelings of depression, anxiety and their impact on thinking patterns and behaviors. Verbalize an understanding of the role that distorted thinking plays in creating fears, excessive worry, and ruminations.    Tatiana participated in the creation of the treatment plan)   [name], LCSW

## 2024-05-25 ENCOUNTER — Telehealth: Admitting: Physician Assistant

## 2024-05-25 DIAGNOSIS — J329 Chronic sinusitis, unspecified: Secondary | ICD-10-CM

## 2024-05-25 DIAGNOSIS — B9789 Other viral agents as the cause of diseases classified elsewhere: Secondary | ICD-10-CM

## 2024-05-25 MED ORDER — LEVOCETIRIZINE DIHYDROCHLORIDE 5 MG PO TABS: 5.0000 mg | ORAL_TABLET | Freq: Every evening | ORAL | 0 refills | Status: DC

## 2024-05-25 NOTE — Progress Notes (Signed)
 Virtual Visit Consent   Brandon Jackson, you are scheduled for a virtual visit with a [facility] provider today. Just as with appointments in the office, your consent must be obtained to participate. Your consent will be active for this visit and any virtual visit you may have with one of our providers in the next 365 days. If you have a MyChart account, a copy of this consent can be sent to you electronically.  As this is a virtual visit, video technology does not allow for your provider to perform a traditional examination. This may limit your provider's ability to fully assess your condition. If your provider identifies any concerns that need to be evaluated in person or the need to arrange testing (such as labs, EKG, etc.), we will make arrangements to do so. Although advances in technology are sophisticated, we cannot ensure that it will always work on either your end or our end. If the connection with a video visit is poor, the visit may have to be switched to a telephone visit. With either a video or telephone visit, we are not always able to ensure that we have a secure connection.  By engaging in this virtual visit, you consent to the provision of healthcare and authorize for your insurance to be billed (if applicable) for the services provided during this visit. Depending on your insurance coverage, you may receive a charge related to this service.  I need to obtain your verbal consent now. Are you willing to proceed with your visit today? Brandon Jackson has provided verbal consent on 05/25/2024 for a virtual visit (video or telephone). Teena Shuck, [place]  Date: 05/25/2024 12:25 PM   Virtual Visit via Video Note   I, Teena Shuck, connected with  Brandon Jackson  ([id number], 18-Dec-1980) on 05/25/24 at 12:15 PM EDT by a video-enabled telemedicine application and verified that I am speaking with the correct person using two identifiers.  Location: Patient: Virtual Visit  Location Patient: Home Provider: Virtual Visit Location Provider: Home Office   I discussed the limitations of evaluation and management by telemedicine and the availability of in person appointments. The patient expressed understanding and agreed to proceed.    History of Present Illness: Brandon Jackson is a 43 y.o. who identifies as a male who was assigned male at birth, and is being seen today for sinus symptoms.  HPI: Sinus Problem This is a new problem. The current episode started yesterday. The problem is unchanged. There has been no fever. Associated symptoms include congestion and sinus pressure. Treatments tried: NSAIDs. The treatment provided mild relief.    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  He can tolerate wellbutrin  when taken with lexparo.    Erythromycin Diarrhea and Rash   Medications:   Current Outpatient Medications:    allopurinol  (ZYLOPRIM ) 100 MG tablet, Take 1 tablet by mouth twice daily., Disp: 180 tablet, Rfl: 2   Ascorbic Acid (VITAMIN C PO), Take by mouth., Disp: , Rfl:    B Complex Vitamins (VITAMIN B COMPLEX PO), Take by mouth., Disp: , Rfl:    buPROPion  ER (WELLBUTRIN  SR) 100 MG 12 hr tablet, Take 1 tablet (100 mg total) by mouth 2 (two) times daily., Disp: 180 tablet, Rfl: 3   busPIRone  (BUSPAR ) 10 MG tablet, Take 2 tablets (20 mg total) by mouth 3 (three) times daily., Disp: 540 tablet, Rfl: 3   cetirizine  (ZYRTEC ) 10 MG tablet, Take 1 tablet (10 mg total) by mouth daily., Disp: , Rfl:    clonazePAM  (KLONOPIN ) 0.5 MG tablet, TAKE 1/2 TO 1 TABLET BY MOUTH TWICE A DAY IF NEEDED FOR ANXIETY AND 1/2 TO 1 TABLET AT NIGHT IF NEEDED FOR INSOMNIA, CAUTION SEDATION., Disp: 20 tablet, Rfl: 1   Cranberry 500 MG CAPS, Take by mouth., Disp: , Rfl:    emtricitabine -tenofovir  (TRUVADA) 200-300 MG tablet, Take 1 tablet by mouth daily., Disp: 90 tablet, Rfl: 1   fluticasone  (FLONASE ) 50 MCG/ACT nasal spray, Place 1 spray into both nostrils as needed., Disp: , Rfl:    hydrOXYzine  (VISTARIL ) 25 MG capsule, Take 1 to 3 capsules by mouth at bedtime as needed for anxiety., Disp: 270 capsule, Rfl: 0   Lysine 1000 MG TABS, , Disp: , Rfl:    meloxicam  (MOBIC ) 15 MG tablet, Take 1 tablet (15 mg total) by mouth as needed., Disp: , Rfl:    Multiple Vitamin (MULTIVITAMIN) tablet, Take 1 tablet by mouth daily., Disp: , Rfl:    Omega-3 Fatty Acids (FISH OIL) 300 MG CAPS, Take 300 mg by mouth daily., Disp: , Rfl:    predniSONE  (DELTASONE ) 10 MG tablet, Take 2 a day for 5 days, then 1 a day for 5 days, with food. Don't take with aleve/ibuprofen/meloxicam ., Disp: 15 tablet, Rfl: 0   tadalafil (CIALIS) 5 MG tablet, Take 5 mg by mouth daily., Disp: , Rfl:    valACYclovir  (VALTREX ) 500 MG tablet, TAKE 1 TABLET (500 MG TOTAL) BY MOUTH DAILY., Disp: 90 tablet, Rfl: 1  Observations/Objective: Patient  is well-developed, well-nourished in no acute distress.  Resting comfortably  at home.  Head is normocephalic, atraumatic.  No labored breathing.  Speech is clear and coherent with logical content.  Patient is alert and oriented at baseline.    Assessment and Plan: 1. Viral sinusitis (Primary)  Presentation consistent with sinusitis.  No evidence of other bacterial infections including pneumonia, meningitis, pharyngitis, otitis media.  Discussed that this fits picture of viral sinusitis and that due to type and duration of symptoms and exam findings, we will treat as viral sinusitis.  Advised to continue ibuprofen and Tylenol at home. Patient is to follow up with primary physician if having continued symptoms.   Advised patient on supportive therapies, including using a cool-mist vaporizer/humidifier/steam from hot showers, OTC throat lozenges, advancement of fluids as tolerated, nasal saline sprays, rest, OTC acetaminophen or ibuprofen for pain control, frequent handwashing.  Follow Up Instructions: I discussed the assessment and treatment plan with the  patient. The patient was provided an opportunity to ask questions and all were answered. The patient agreed with the plan and demonstrated an understanding of the instructions.  A copy of instructions were sent to the patient via MyChart unless otherwise noted below.     The patient was advised to call back or seek an in-person evaluation if the symptoms worsen or if the condition fails to improve as anticipated.    Teena Shuck, PA-C

## 2024-05-25 NOTE — Patient Instructions (Signed)
 Brandon Jackson, thank you for joining Teena Shuck, PA-C for today's virtual visit.  While this provider is not your primary care provider (PCP), if your PCP is located in our provider database this encounter information will be shared with them immediately following your visit.   A [facility] MyChart account gives you access to today's visit and all your visits, tests, and labs performed at [facility]  click here if you don't have a [facility] MyChart account or go to [email or web address]  Consent: (Patient) Brandon Jackson provided verbal consent for this virtual visit at the beginning of the encounter.  Current Medications:  Current Outpatient Medications:    allopurinol  (ZYLOPRIM ) 100 MG tablet, Take 1 tablet by mouth twice daily., Disp: 180 tablet, Rfl: 2   Ascorbic Acid (VITAMIN C PO), Take by mouth., Disp: , Rfl:    B Complex Vitamins (VITAMIN B COMPLEX PO), Take by mouth., Disp: , Rfl:    buPROPion  ER (WELLBUTRIN  SR) 100 MG 12 hr tablet, Take 1 tablet (100 mg total) by mouth 2 (two) times daily., Disp: 180 tablet, Rfl: 3   busPIRone  (BUSPAR ) 10 MG tablet, Take 2 tablets (20 mg total) by mouth 3 (three) times daily., Disp: 540 tablet, Rfl: 3   cetirizine  (ZYRTEC ) 10 MG tablet, Take 1 tablet (10 mg total) by mouth daily., Disp: , Rfl:    clonazePAM  (KLONOPIN ) 0.5 MG tablet, TAKE 1/2 TO 1 TABLET BY MOUTH TWICE A DAY IF NEEDED FOR ANXIETY AND 1/2 TO 1 TABLET AT NIGHT IF NEEDED FOR INSOMNIA, CAUTION SEDATION., Disp: 20 tablet, Rfl: 1   Cranberry 500 MG CAPS, Take by mouth., Disp: , Rfl:    emtricitabine -tenofovir  (TRUVADA) 200-300 MG tablet, Take 1 tablet by mouth daily., Disp: 90 tablet, Rfl: 1   fluticasone  (FLONASE ) 50 MCG/ACT nasal spray, Place 1 spray into both nostrils as needed., Disp: , Rfl:    hydrOXYzine  (VISTARIL ) 25 MG capsule, Take 1 to 3 capsules by mouth at bedtime as needed for anxiety., Disp: 270 capsule, Rfl: 0   Lysine 1000 MG TABS, , Disp:  , Rfl:    meloxicam  (MOBIC ) 15 MG tablet, Take 1 tablet (15 mg total) by mouth as needed., Disp: , Rfl:    Multiple Vitamin (MULTIVITAMIN) tablet, Take 1 tablet by mouth daily., Disp: , Rfl:    Omega-3 Fatty Acids (FISH OIL) 300 MG CAPS, Take 300 mg by mouth daily., Disp: , Rfl:    predniSONE  (DELTASONE ) 10 MG tablet, Take 2 a day for 5 days, then 1 a day for 5 days, with food. Don't take with aleve/ibuprofen/meloxicam ., Disp: 15 tablet, Rfl: 0   tadalafil (CIALIS) 5 MG tablet, Take 5 mg by mouth daily., Disp: , Rfl:    valACYclovir  (VALTREX ) 500 MG tablet, TAKE 1 TABLET (500 MG TOTAL) BY MOUTH DAILY., Disp: 90 tablet, Rfl: 1   Medications ordered in this encounter:  No orders of the defined types were placed in this encounter.    *If you need refills on other medications prior to your next appointment, please contact your pharmacy*  Follow-Up: Call back or seek an in-person evaluation if the symptoms worsen or if the condition fails to improve as anticipated.  [facility] [phone]  Other Instructions Please report to the nearest Emergency room with any worsening symptoms. Follow up with primary care provider (PCP) in 2 -3 days.    If you have been instructed to have an in-person evaluation today at a local Urgent Care facility,  please use the link below. It will take you to a list of all of our available [facility], including address, phone number and hours of operation. Please do not delay care.  [facility]  If you or a family member do not have a primary care provider, use the link below to schedule a visit and establish care. When you choose a [facility] primary care physician or advanced practice provider, you gain a long-term partner in health. Find a Primary Care Provider  Learn more about [facility] in-office and virtual care options: [facility] Get Care Now

## 2024-05-30 ENCOUNTER — Other Ambulatory Visit: Payer: Self-pay | Admitting: Family Medicine

## 2024-06-04 DIAGNOSIS — M9906 Segmental and somatic dysfunction of lower extremity: Secondary | ICD-10-CM | POA: Diagnosis not present

## 2024-06-04 DIAGNOSIS — M9905 Segmental and somatic dysfunction of pelvic region: Secondary | ICD-10-CM | POA: Diagnosis not present

## 2024-06-04 DIAGNOSIS — M9903 Segmental and somatic dysfunction of lumbar region: Secondary | ICD-10-CM | POA: Diagnosis not present

## 2024-06-04 DIAGNOSIS — M5416 Radiculopathy, lumbar region: Secondary | ICD-10-CM | POA: Diagnosis not present

## 2024-06-11 ENCOUNTER — Encounter: Payer: Self-pay | Admitting: Family Medicine

## 2024-06-13 ENCOUNTER — Other Ambulatory Visit: Payer: Self-pay | Admitting: Family Medicine

## 2024-06-13 DIAGNOSIS — M549 Dorsalgia, unspecified: Secondary | ICD-10-CM

## 2024-06-25 ENCOUNTER — Telehealth: Admitting: Psychiatry

## 2024-06-25 ENCOUNTER — Encounter: Payer: Self-pay | Admitting: Psychiatry

## 2024-06-25 ENCOUNTER — Ambulatory Visit: Admitting: Psychology

## 2024-06-25 DIAGNOSIS — F411 Generalized anxiety disorder: Secondary | ICD-10-CM

## 2024-06-25 DIAGNOSIS — F3342 Major depressive disorder, recurrent, in full remission: Secondary | ICD-10-CM

## 2024-06-25 DIAGNOSIS — F5105 Insomnia due to other mental disorder: Secondary | ICD-10-CM | POA: Diagnosis not present

## 2024-06-25 DIAGNOSIS — F99 Mental disorder, not otherwise specified: Secondary | ICD-10-CM | POA: Diagnosis not present

## 2024-06-25 NOTE — Progress Notes (Signed)
 [facility] Counselor/Therapist Progress Note  Patient ID: GALAN GHEE, MRN: [id number]   Date: 06/25/24  Time Spent: 4:33 pm - 5:22 pm : 49 Minutes  Treatment Type: Individual Therapy.  Reported Symptoms: Depression and anxiety.   Mental Status Exam: Appearance:  Neat and Well Groomed     Behavior: Appropriate  Motor: Normal  Speech/Language:  Clear and Coherent  Affect: Appropriate  Mood: irritable  Thought process: normal  Thought content:   WNL  Sensory/Perceptual disturbances:   WNL  Orientation: oriented to person, place, time/date, and situation  Attention: Good  Concentration: Good  Memory: WNL  Fund of knowledge:  Good  Insight:   Good  Judgment:  Good  Impulse Control: Good   Risk Assessment: Danger to Self:  No Self-injurious Behavior: No Danger to Others: No Duty to Warn:no Physical Aggression / Violence:No  Access to Firearms a concern: No  Gang Involvement:No   Subjective:   [name] participated from home, via video and consented to treatment. Therapist participated from office. Quenten reviewed the events of the past week. He noted some improvement in his work related stressors. He noted contributing factors to this including his acceptance that he can only do so much in a given week and that this is a reflection on the system. He noted some increased support from his coworkers, as well. He noted communicating his new approach to his supervisor and higher ups. He noted an improvement in his anxiety as a result. He noted meeting with his psychiatric provider but denied any medication changes. He noted his effort to improve his sleep and noted his sleep being poor. He noted some of this being in relation to behavioral choices at night including seeking stimulation and noted his effort to find replacement behaviors including reading. He noted to maintain his boundaries at work. He noted his relationship going well, overall. He  noted wishing to be more active but noted his current back pain being a significant barrier. He noted being slated for an MRI to identify the possible cause for his pain. We worked on processing this during the session. He noted his current goal of mitigating burnout and managing the feelings in relation to including feelings of guilt. Specifically, he noted a need to work on his self-talk in relation to this.  We worked on exploring this during the session and his feelings when he takes time off, when he is unable to complete a work related task.Therapist praised Gram for his mindfulness in this area. Therapist validated [place]'s feelings and experience,encouraged continued self-care, and provided supportive therapy.  A follow-up was scheduled for continued treatment, which Drey benefits from.   Interventions: CBT  Diagnosis:  MDD (major depressive disorder), recurrent, in full remission (HCC)  GAD (generalized anxiety disorder)  Psychiatric Treatment: Yes , VIA PCP.    Treatment Plan:  Client Abilities/Strengths Donjuan is intelligent, self-aware, and motivated for change.   Support System: Family and friends.   Client Treatment Preferences OPT.   Client Statement of Needs Mehul would like to increase assertiveness, advocating for self, engaging in mindfulness, developing home and work-life balance, addressing perfectionism, improving self-talk and identifying positives about self. Additional clinical goals include symptom management, relaxation, self-care, processing events, and bolstering coping skills.   Treatment Level Weekly  Symptoms  Anxiety: Feeling anxiety, difficulty stopping worry, worrying about different things, trouble relaxing, irritability, and feeling afraid something awful might happen.   (Status: maintained) Depression: Loss of interest, fluctuating sleep, fluctuating appetite,  feeling bad about self, difficulty concentrating.    (Status:  maintained)  Goals:   Angus experiences symptoms of of depression and anxiety.   Treatment plan signed and available on s-drive:   No, pending signature via MyChart.  [name] was sent the treatment plan signature form on 04/24/24.   Target Date: 04/23/25 Frequency: Weekly  Progress: 0 Modality: individual    Therapist will provide referrals for additional resources as appropriate.  Therapist will provide psycho-education regarding Sakai's diagnosis and corresponding treatment approaches and interventions. [name], LCSW will support the patient's ability to achieve the goals identified. will employ CBT, BA, Problem-solving, Solution Focused, Mindfulness,  coping skills, & other evidenced-based practices will be used to promote progress towards healthy functioning to help manage decrease symptoms associated with his diagnosis.   Reduce overall level, frequency, and intensity of the feelings of depression, anxiety and panic evidenced by decreased overall symptoms from 6 to 7 days/week to 0 to 1 days/week per client report for at least 3 consecutive months. Verbally express understanding of the relationship between feelings of depression, anxiety and their impact on thinking patterns and behaviors. Verbalize an understanding of the role that distorted thinking plays in creating fears, excessive worry, and ruminations.    Tatiana participated in the creation of the treatment plan)   [name], LCSW

## 2024-06-25 NOTE — Progress Notes (Signed)
 Virtual Visit via Video Note  I connected with Brandon Jackson on 06/25/24 at  4:00 PM EDT by a video enabled telemedicine application and verified that I am speaking with the correct person using two identifiers.  Location Provider Location : [facility] Patient Location : Work  Participants: Patient , Provider   I discussed the limitations of evaluation and management by telemedicine and the availability of in person appointments. The patient expressed understanding and agreed to proceed.  I discussed the assessment and treatment plan with the patient. The patient was provided an opportunity to ask questions and all were answered. The patient agreed with the plan and demonstrated an understanding of the instructions.   The patient was advised to call back or seek an in-person evaluation if the symptoms worsen or if the condition fails to improve as anticipated.   BH MD OP Progress Note  06/25/2024 4:24 PM LAZER WOLLARD  MRN:  [id number]  Chief Complaint:  Chief Complaint  Patient presents with   Follow-up   Anxiety   Depression   Medication Refill   Discussed the use of AI scribe software for clinical note transcription with the patient, who gave verbal consent to proceed.  History of Present Illness Brandon Jackson is a 43 year old Caucasian male, currently employed, lives in [place], has a history of MDD, GAD, hyperlipidemia, insomnia was evaluated by telemedicine today.  He reports that his depression remains in remission and that his anxiety has improved compared to previous visits. He reports that a more supportive work environment, with increased workload and frequent schedule changes, has contributed to this improvement. He describes feeling less anxious overall, though he experienced increased anxiety the previous night related to work demands, which he managed effectively. He reports that organizational strategies, such as daily checklists, continue to help him  manage tasks and reduce stress.  He states that his sleep has improved significantly, with fewer awakenings and less disruption from racing thoughts. He notes that recent sleep difficulties have related to back pain and a course of steroids, rather than other symptoms. He continues to take Wellbutrin  100 mg twice daily, which he finds helpful for his symptoms, and Buspar  20 mg three times daily, which he has used for over a year. He also uses clonazepam  as needed for racing thoughts before bed, most recently taking 1 dose the previous night, but reports infrequent use overall.  He reports ongoing engagement in therapy, with sessions currently about once a month, down from every 2 weeks during a prior period of crisis. He feels he is in a better place now and is more aware of his reactions and coping strategies.  He denies thoughts of hurting himself or others.     Visit Diagnosis:    ICD-10-CM   1. MDD (major depressive disorder), recurrent, in full remission (HCC)  F33.42     2. GAD (generalized anxiety disorder)  F41.1     3. Insomnia due to other mental disorder  F51.05    F99    Anxiety, pain      Past Psychiatric History: I have reviewed past psychiatric history from progress note on 12/23/2021.  Past trials of Pristiq , duloxetine, Lexapro , Lunesta , Effexor , multiple other medications.  Past Medical History:  Past Medical History:  Diagnosis Date   Anxiety    Depression    Frequent headaches    Gout    History of chicken pox    Migraines    Ulcer     Past Surgical  History:  Procedure Laterality Date   CYSTECTOMY  2003   TONSILLECTOMY AND ADENOIDECTOMY  2007   Wisdom Teeth Removal  1998    Family Psychiatric History: I have reviewed family psychiatric history from progress note on 12/23/2021.  Family History:  Family History  Problem Relation Age of Onset   Depression Mother    Diabetes Father    Hyperlipidemia Father    Hypertension Father    Colon cancer Neg Hx     Prostate cancer Neg Hx     Social History: I have reviewed social history from progress note on 12/23/2021. Social History   Socioeconomic History   Marital status: Significant Other    Spouse name: Not on file   Number of children: Not on file   Years of education: College   Highest education level: Bachelor's degree (e.g., BA, AB, BS)  Occupational History   Occupation: Photographer: [employer]  Tobacco Use   Smoking status: Former    Current packs/day: 0.00    Types: Cigarettes    Quit date: 2013    Years since quitting: 12.6    Passive exposure: Past   Smokeless tobacco: Never  Vaping Use   Vaping status: Never Used  Substance and Sexual Activity   Alcohol use: Yes    Comment: occasional   Drug use: No   Sexual activity: Not on file  Other Topics Concern   Not on file  Social History Narrative   Single   Was in committed relationship 2007-2023.   [employer] grad '03   Working at [employer] as of 2019, prev newspaper work in [place].     Social Drivers of Corporate investment banker Strain: Low Risk  (03/27/2024)   Overall Financial Resource Strain (CARDIA)    Difficulty of Paying Living Expenses: Not very hard  Food Insecurity: No Food Insecurity (03/27/2024)   Hunger Vital Sign    Worried About Running Out of Food in the Last Year: Never true    Ran Out of Food in the Last Year: Never true  Transportation Needs: No Transportation Needs (03/27/2024)   PRAPARE - Administrator, Civil Service (Medical): No    Lack of Transportation (Non-Medical): No  Physical Activity: Sufficiently Active (03/27/2024)   Exercise Vital Sign    Days of Exercise per Week: 3 days    Minutes of Exercise per Session: 50 min  Stress: No Stress Concern Present (03/27/2024)   [employer] of Occupational Health - Occupational Stress Questionnaire    Feeling of Stress : Only a little  Social Connections: Socially Isolated (03/27/2024)   Social Connection and  Isolation Panel    Frequency of Communication with Friends and Family: Twice a week    Frequency of Social Gatherings with Friends and Family: Twice a week    Attends Religious Services: Never    Database administrator or Organizations: No    Attends Engineer, structural: Not on file    Marital Status: Divorced    Allergies:  Allergies  Allergen Reactions   Wellbutrin  [Bupropion ] Other (See Comments)    Anxiety increased on med when taken alone.  He can tolerate wellbutrin  when taken with lexparo.    Erythromycin Diarrhea and Rash    Metabolic Disorder Labs: No results found for: HGBA1C, MPG No results found for: PROLACTIN Lab Results  Component Value Date   CHOL 154 05/17/2022   TRIG 111.0 05/17/2022   HDL 49.60 05/17/2022  CHOLHDL 3 05/17/2022   VLDL 22.2 05/17/2022   LDLCALC 82 05/17/2022   LDLCALC 82 10/12/2021   Lab Results  Component Value Date   TSH 1.24 05/17/2022   TSH 1.310 01/05/2022    Therapeutic Level Labs: No results found for: LITHIUM No results found for: VALPROATE No results found for: CBMZ  Current Medications: Current Outpatient Medications  Medication Sig Dispense Refill   allopurinol  (ZYLOPRIM ) 100 MG tablet Take 1 tablet by mouth twice daily. 180 tablet 2   Ascorbic Acid (VITAMIN C PO) Take by mouth.     B Complex Vitamins (VITAMIN B COMPLEX PO) Take by mouth.     buPROPion  ER (WELLBUTRIN  SR) 100 MG 12 hr tablet Take 1 tablet (100 mg total) by mouth 2 (two) times daily. 180 tablet 3   busPIRone  (BUSPAR ) 10 MG tablet Take 2 tablets (20 mg total) by mouth 3 (three) times daily. 540 tablet 3   cetirizine  (ZYRTEC ) 10 MG tablet Take 1 tablet (10 mg total) by mouth daily.     clonazePAM  (KLONOPIN ) 0.5 MG tablet TAKE 1/2 TO 1 TABLET BY MOUTH TWICE A DAY IF NEEDED FOR ANXIETY AND 1/2 TO 1 TABLET AT NIGHT IF NEEDED FOR INSOMNIA, CAUTION SEDATION. 20 tablet 1   Cranberry 500 MG CAPS Take by mouth.     emtricitabine -tenofovir   (TRUVADA) 200-300 MG tablet Take 1 tablet by mouth daily.  Please schedule your yearly visit for summer 2025. 90 tablet 0   fluticasone  (FLONASE ) 50 MCG/ACT nasal spray Place 1 spray into both nostrils as needed.     hydrOXYzine  (VISTARIL ) 25 MG capsule Take 1 to 3 capsules by mouth at bedtime as needed for anxiety. 270 capsule 0   levocetirizine (XYZAL  ALLERGY 24HR) 5 MG tablet Take 1 tablet (5 mg total) by mouth every evening for 5 days. 5 tablet 0   Lysine 1000 MG TABS      meloxicam  (MOBIC ) 15 MG tablet Take 1 tablet (15 mg total) by mouth as needed.     Multiple Vitamin (MULTIVITAMIN) tablet Take 1 tablet by mouth daily.     Omega-3 Fatty Acids (FISH OIL) 300 MG CAPS Take 300 mg by mouth daily.     predniSONE  (DELTASONE ) 10 MG tablet Take 2 a day for 5 days, then 1 a day for 5 days, with food. Don't take with aleve/ibuprofen/meloxicam . 15 tablet 0   tadalafil (CIALIS) 5 MG tablet Take 5 mg by mouth daily.     valACYclovir  (VALTREX ) 500 MG tablet TAKE 1 TABLET (500 MG TOTAL) BY MOUTH DAILY. 90 tablet 1   No current facility-administered medications for this visit.     Musculoskeletal: Strength & Muscle Tone: UTA Gait & Station: Seated Patient leans: N/A  Psychiatric Specialty Exam: Review of Systems  Psychiatric/Behavioral:  Positive for sleep disturbance.     There were no vitals taken for this visit.There is no height or weight on file to calculate BMI.  General Appearance: Casual  Eye Contact:  Fair  Speech:  Clear and Coherent  Volume:  Normal  Mood:  Euthymic  Affect:  Congruent  Thought Process:  Goal Directed and Descriptions of Associations: Intact  Orientation:  Full (Time, Place, and Person)  Thought Content: Logical   Suicidal Thoughts:  No  Homicidal Thoughts:  No  Memory:  Immediate;   Fair Recent;   Fair Remote;   Fair  Judgement:  Intact  Insight:  Fair  Psychomotor Activity:  Normal  Concentration:  Concentration: Fair and Attention Span:  Fair  Recall:   Dotti Abe of Knowledge: Fair  Language: Fair  Akathisia:  No  Handed:  Right  AIMS (if indicated): not done  Assets:  Communication Skills Desire for Improvement Housing Transportation  ADL's:  Intact  Cognition: WNL  Sleep:  poor due to back pain   Screenings: AIMS    Flowsheet Row Video Visit from 03/03/2022 in [facility]  AIMS Total Score 0   AUDIT    Flowsheet Row Office Visit from 03/29/2024 in [facility]  Alcohol Use Disorder Identification Test Final Score (AUDIT) 4    GAD-7    Flowsheet Row Office Visit from 03/29/2024 in [facility] [place] Office Visit from 09/26/2023 in [facility] Office Visit from 07/07/2023 in [facility] Office Visit from 06/14/2023 in [facility] Video Visit from 03/03/2023 in [facility]  Total GAD-7 Score 7 7 14 6 8    PHQ2-9    Flowsheet Row Office Visit from 03/29/2024 in [facility] Office Visit from 09/26/2023 in [facility] Office Visit from 07/07/2023 in [facility] Office Visit from 06/14/2023 in [facility] Video Visit from 03/03/2023 in [facility]  PHQ-2 Total Score 1 0 2 2 1   PHQ-9 Total Score 10 -- 9 8 6    Flowsheet Row Video Visit from 06/25/2024 in [facility] Video Visit from 03/19/2024 in [facility] Video Visit from 12/26/2023 in [facility]  C-SSRS RISK CATEGORY No Risk No Risk No Risk     Assessment and Plan: AYDDEN CUMPIAN is a 43 year old Caucasian male, currently has a  diagnosis of generalized anxiety disorder, depression, insomnia was evaluated by telemedicine today.  Discussed assessment and plan as noted below.  Generalized anxiety disorder-stable Currently denies any significant anxiety symptoms. Continue BuSpar  20 mg 3 times daily Continue Clonazepam  0.5 mg as needed for racing thoughts before bed.  Currently limiting use. Continue Hydroxyzine  25-75 mg at bedtime as needed Continue CBT with Mr.Osman Mostafa.  Depression in remission Currently denies any significant depression symptoms. Continue Wellbutrin  100 mg twice daily Continue psychotherapy sessions.  Insomnia-improving Currently sleep problems due to pain.  Completed a course of prednisone  which helped although continues to have back pain. Will need sufficient pain management. Continue Hydroxyzine  25 mg - 75 mg at bedtime as needed Continue Clonazepam  as prescribed. Reviewed [place] PMP AWARxE   Was referred for ADHD testing previously-pending.  Patient would like to hold this off for now due to financial limitations.  Follow-up Follow-up in clinic in 3 months or sooner in person.  Collaboration of Care: Collaboration of Care: Referral or follow-up with counselor/therapist AEB encouraged to  continue psychotherapy sessions.  Patient/Guardian was advised Release of Information must be obtained prior to any record release in order to collaborate their care with an outside provider. Patient/Guardian was advised if they have not already done so to contact the registration department to sign all necessary forms in order for us  to release information regarding their care.   Consent: Patient/Guardian gives verbal consent for treatment and assignment of benefits for services provided during this visit. Patient/Guardian expressed understanding and agreed to proceed.  This  note was generated in part or whole with voice recognition software. Voice recognition is usually quite accurate but there are  transcription errors that can and very often do occur. I apologize for any typographical errors that were not detected and corrected.     Mariadelcarmen Corella, MD 06/26/2024, 5:27 PM

## 2024-06-28 DIAGNOSIS — M5416 Radiculopathy, lumbar region: Secondary | ICD-10-CM | POA: Diagnosis not present

## 2024-06-28 DIAGNOSIS — M9906 Segmental and somatic dysfunction of lower extremity: Secondary | ICD-10-CM | POA: Diagnosis not present

## 2024-06-28 DIAGNOSIS — M9903 Segmental and somatic dysfunction of lumbar region: Secondary | ICD-10-CM | POA: Diagnosis not present

## 2024-06-28 DIAGNOSIS — M9905 Segmental and somatic dysfunction of pelvic region: Secondary | ICD-10-CM | POA: Diagnosis not present

## 2024-07-11 ENCOUNTER — Other Ambulatory Visit: Payer: Self-pay | Admitting: Chiropractic Medicine

## 2024-07-11 ENCOUNTER — Ambulatory Visit
Admission: RE | Admit: 2024-07-11 | Discharge: 2024-07-11 | Disposition: A | Discharge status: Home / Self Care | Source: Ambulatory Visit | Attending: Chiropractic Medicine | Admitting: Chiropractic Medicine

## 2024-07-11 DIAGNOSIS — M4726 Other spondylosis with radiculopathy, lumbar region: Secondary | ICD-10-CM | POA: Diagnosis not present

## 2024-07-11 DIAGNOSIS — M5116 Intervertebral disc disorders with radiculopathy, lumbar region: Secondary | ICD-10-CM | POA: Diagnosis not present

## 2024-07-11 DIAGNOSIS — M5416 Radiculopathy, lumbar region: Secondary | ICD-10-CM

## 2024-07-16 DIAGNOSIS — M5416 Radiculopathy, lumbar region: Secondary | ICD-10-CM | POA: Diagnosis not present

## 2024-07-16 DIAGNOSIS — M9905 Segmental and somatic dysfunction of pelvic region: Secondary | ICD-10-CM | POA: Diagnosis not present

## 2024-07-16 DIAGNOSIS — M9903 Segmental and somatic dysfunction of lumbar region: Secondary | ICD-10-CM | POA: Diagnosis not present

## 2024-07-16 DIAGNOSIS — M9906 Segmental and somatic dysfunction of lower extremity: Secondary | ICD-10-CM | POA: Diagnosis not present

## 2024-07-24 ENCOUNTER — Other Ambulatory Visit: Payer: Self-pay | Admitting: Chiropractic Medicine

## 2024-07-24 DIAGNOSIS — M9903 Segmental and somatic dysfunction of lumbar region: Secondary | ICD-10-CM

## 2024-07-24 DIAGNOSIS — M5416 Radiculopathy, lumbar region: Secondary | ICD-10-CM

## 2024-07-30 ENCOUNTER — Ambulatory Visit: Admitting: Psychology

## 2024-07-30 DIAGNOSIS — F411 Generalized anxiety disorder: Secondary | ICD-10-CM

## 2024-07-30 DIAGNOSIS — F3342 Major depressive disorder, recurrent, in full remission: Secondary | ICD-10-CM

## 2024-07-30 NOTE — Progress Notes (Signed)
 [facility] Counselor/Therapist Progress Note  Patient ID: Brandon Jackson, MRN: [id number]   Date: 07/30/24  Time Spent: 3:31 pm - 4:19 pm : 48 Minutes  Treatment Type: Individual Therapy.  Reported Symptoms: Depression and anxiety.   Mental Status Exam: Appearance:  Neat and Well Groomed     Behavior: Appropriate  Motor: Normal  Speech/Language:  Clear and Coherent  Affect: Appropriate  Mood: normal  Thought process: normal  Thought content:   WNL  Sensory/Perceptual disturbances:   WNL  Orientation: oriented to person, place, time/date, and situation  Attention: Good  Concentration: Good  Memory: WNL  Fund of knowledge:  Good  Insight:   Good  Judgment:  Good  Impulse Control: Good   Risk Assessment: Danger to Self:  No Self-injurious Behavior: No Danger to Others: No Duty to Warn:no Physical Aggression / Violence:No  Access to Firearms a concern: No  Gang Involvement:No   Subjective:   Brandon Jackson participated from office, via video and consented to treatment. Therapist participated from office. Brandon Jackson reviewed the events of the past week. He noted work being quite busy an noted that this pace will be maintained until the ned of the year. He noted having more work that I could ever do but noted that he isn't carrying it. He noted his efforts to set limits and boundaries with others. He noted having a recent positive annual review. He noted being deeply disturbed by the political climate. He noted have little control in this area. He wondered about the long-term compatibility in his relationship due to this stark political difference. He noted being more emotionally invested in politics and discussion. He noted continued difficulty with sleep despite taking his hydroxyzine  and engaging in sleep hygiene activities. He noted often being wound up at night.  We worked on identifying possible changes and Brandon Jackson noted that caffeine doesn't  typically affect him. We worked on delineating a plan to progressively reduce the caffeine. Brandon Jackson was receptive to this during the session. He noted worry about his parents as they age and noted his brother suspecting possible cognitive decline for their father in particular. He noted communicating with his brother regarding this and noted not yet being alarm by his brother's recall of events. He continued to deal with back pain and is slated to follow-up with his provider. We worked on reviewing coping for the winter time due to his history of seasonal affective disorder. Brandon Jackson noted a need to work on sleep, managing his self-talk, being more social, maintaining a routine throughout the winter including things to look forward to. Therapist praised Brandon Jackson for his effort to be proactive with his mood management. Therapist validated Brandon Jackson's overall feelings and experience during the session and provided supportive therapy. A follow-up was scheduled for continued treatment, which he benefits from.   Interventions: CBT  Diagnosis:  MDD (major depressive disorder), recurrent, in full remission (HCC)  GAD (generalized anxiety disorder)  Psychiatric Treatment: Yes , VIA PCP.    Treatment Plan:  Client Abilities/Strengths Brandon Jackson is intelligent, self-aware, and motivated for change.   Support System: Family and friends.   Client Treatment Preferences OPT.   Client Statement of Needs Brandon Jackson would like to increase assertiveness, advocating for self, engaging in mindfulness, developing home and work-life balance, addressing perfectionism, improving self-talk and identifying positives about self. Additional clinical goals include symptom management, relaxation, self-care, processing events, and bolstering coping skills.   Treatment Level Weekly  Symptoms  Anxiety: Feeling anxiety, difficulty stopping worry,  worrying about different things, trouble relaxing, irritability, and feeling  afraid something awful might happen.   (Status: maintained) Depression: Loss of interest, fluctuating sleep, fluctuating appetite, feeling bad about self, difficulty concentrating.    (Status: maintained)  Goals:   Brandon Jackson experiences symptoms of of depression and anxiety.   Treatment plan signed and available on s-drive:   No, pending signature via MyChart.  Brandon Jackson was sent the treatment plan signature form on 04/24/24.   Target Date: 04/23/25 Frequency: Weekly  Progress: 0 Modality: individual    Therapist will provide referrals for additional resources as appropriate.  Therapist will provide psycho-education regarding Brandon Jackson's diagnosis and corresponding treatment approaches and interventions. Brandon Mullet, Brandon Jackson will support the patient's ability to achieve the goals identified. will employ CBT, BA, Problem-solving, Solution Focused, Mindfulness,  coping skills, & other evidenced-based practices will be used to promote progress towards healthy functioning to help manage decrease symptoms associated with his diagnosis.   Reduce overall level, frequency, and intensity of the feelings of depression, anxiety and panic evidenced by decreased overall symptoms from 6 to 7 days/week to 0 to 1 days/week per client report for at least 3 consecutive months. Verbally express understanding of the relationship between feelings of depression, anxiety and their impact on thinking patterns and behaviors. Verbalize an understanding of the role that distorted thinking plays in creating fears, excessive worry, and ruminations.    Tatiana participated in the creation of the treatment plan)   Brandon Mullet, Brandon Jackson

## 2024-08-13 DIAGNOSIS — M9906 Segmental and somatic dysfunction of lower extremity: Secondary | ICD-10-CM | POA: Diagnosis not present

## 2024-08-13 DIAGNOSIS — M9903 Segmental and somatic dysfunction of lumbar region: Secondary | ICD-10-CM | POA: Diagnosis not present

## 2024-08-13 DIAGNOSIS — M5416 Radiculopathy, lumbar region: Secondary | ICD-10-CM | POA: Diagnosis not present

## 2024-08-13 DIAGNOSIS — M9905 Segmental and somatic dysfunction of pelvic region: Secondary | ICD-10-CM | POA: Diagnosis not present

## 2024-08-22 ENCOUNTER — Ambulatory Visit
Admission: RE | Admit: 2024-08-22 | Discharge: 2024-08-22 | Disposition: A | Discharge status: Home / Self Care | Source: Ambulatory Visit | Attending: Chiropractic Medicine | Admitting: Chiropractic Medicine

## 2024-08-22 DIAGNOSIS — M9903 Segmental and somatic dysfunction of lumbar region: Secondary | ICD-10-CM

## 2024-08-22 DIAGNOSIS — M5416 Radiculopathy, lumbar region: Secondary | ICD-10-CM

## 2024-08-22 DIAGNOSIS — M47816 Spondylosis without myelopathy or radiculopathy, lumbar region: Secondary | ICD-10-CM | POA: Diagnosis not present

## 2024-08-22 DIAGNOSIS — M5126 Other intervertebral disc displacement, lumbar region: Secondary | ICD-10-CM | POA: Diagnosis not present

## 2024-08-27 ENCOUNTER — Ambulatory Visit: Admitting: Psychiatry

## 2024-08-27 ENCOUNTER — Encounter: Payer: Self-pay | Admitting: Family Medicine

## 2024-08-28 ENCOUNTER — Ambulatory Visit: Admitting: Psychology

## 2024-08-28 DIAGNOSIS — F411 Generalized anxiety disorder: Secondary | ICD-10-CM

## 2024-08-28 DIAGNOSIS — F3342 Major depressive disorder, recurrent, in full remission: Secondary | ICD-10-CM

## 2024-08-28 NOTE — Progress Notes (Unsigned)
 [facility] Counselor/Therapist Progress Note  Patient ID: Brandon Jackson, MRN: [id number]   Date: 08/28/24  Time Spent: 5:02 pm - 6:01 pm : 59 Minutes  Treatment Type: Individual Therapy.  Reported Symptoms: Depression and anxiety.   Mental Status Exam: Appearance:  Neat and Well Groomed     Behavior: Appropriate  Motor: Normal  Speech/Language:  Clear and Coherent  Affect: Appropriate  Mood: normal  Thought process: normal  Thought content:   WNL  Sensory/Perceptual disturbances:   WNL  Orientation: oriented to person, place, time/date, and situation  Attention: Good  Concentration: Good  Memory: WNL  Fund of knowledge:  Good  Insight:   Good  Judgment:  Good  Impulse Control: Good   Risk Assessment: Danger to Self:  No Self-injurious Behavior: No Danger to Others: No Duty to Warn:no Physical Aggression / Violence:No  Access to Firearms a concern: No  Gang Involvement:No   Subjective:   Brandon Jackson participated from office, via video and consented to treatment. Therapist participated from office. Brandon Jackson reviewed the events of the past week. Brandon Jackson noted keeping a sleep journal being tedious. He noted his sleep generally being better. He noted a couple of nights of middle insomnia. He noted his intent his intent to speak with his prescriber regarding his sleep aid. He noted a recent MRI on his back and noted initial results being hopeful. He noted being slated to begin physical therapy. He noted being very frustrated with his partner who is a Clinical research associate in relation to their difference of political stances. He noted this topic being stressful for him and noted often having difficulty managing his frustration. He noted feeling, in many ways, feeling offended and worried. He noted worry about the viability of the relationship due to various factors. He noted a lack of alignment with his partner on various topics and issues. We will continue to  explore this going forward. He noted feeling more self-assured at work and noted being more assertive and direct. Therapist praised Brandon Jackson for his effort in this area specifically boundaries and assertiveness. Brandon Jackson noted having a shorter fuse than he would like when things don't go as they should. He noted recently yelling at his computer, a few times a day, due to various slow downs and frustrations. He noted this this not only being relegated to the computer alone.  He noted a need manage this proactively. We worked on reviewing his current tools to manage his frustration. We discussed identifying physiological warning signs, self-talk, negative thoughts and feelings, and working on taking breaks and engaging in relaxation. We worked on reframing, challenging negative self-talk, feelings, and thoughts, employing radical acceptance. Therapist encouraged increased mindfulness as we begin engaging in a task that has been stressful. We worked on exploring his expectations and feeling pressure for deadlines and feeling tired  Deadline pressure     He noted work being quite busy an noted that this pace will be maintained until the ned of the year. He noted having more work that I could ever do but noted that he isn't carrying it. He noted his efforts to set limits and boundaries with others. He noted having a recent positive annual review. He noted being deeply disturbed by the political climate. He noted have little control in this area. He wondered about the long-term compatibility in his relationship due to this stark political difference. He noted being more emotionally invested in politics and discussion. He noted continued difficulty with sleep despite taking his  hydroxyzine  and engaging in sleep hygiene activities. He noted often being wound up at night.  We worked on identifying possible changes and Brandon Jackson noted that caffeine doesn't typically affect him. We worked on delineating a plan to  progressively reduce the caffeine. Brandon Jackson was receptive to this during the session. He noted worry about his parents as they age and noted his brother suspecting possible cognitive decline for their father in particular. He noted communicating with his brother regarding this and noted not yet being alarm by his brother's recall of events. He continued to deal with back pain and is slated to follow-up with his provider. We worked on reviewing coping for the winter time due to his history of seasonal affective disorder. Brandon Jackson noted a need to work on sleep, managing his self-talk, being more social, maintaining a routine throughout the winter including things to look forward to. Therapist praised Brandon Jackson for his effort to be proactive with his mood management. Therapist validated Brandon Jackson's overall feelings and experience during the session and provided supportive therapy. A follow-up was scheduled for continued treatment, which he benefits from.   Interventions: CBT  Diagnosis:  MDD (major depressive disorder), recurrent, in full remission  GAD (generalized anxiety disorder)  Psychiatric Treatment: Yes , VIA PCP.    Treatment Plan:  Client Abilities/Strengths Brandon Jackson is intelligent, self-aware, and motivated for change.   Support System: Family and friends.   Client Treatment Preferences OPT.   Client Statement of Needs Brandon Jackson would like to increase assertiveness, advocating for self, engaging in mindfulness, developing home and work-life balance, addressing perfectionism, improving self-talk and identifying positives about self. Additional clinical goals include symptom management, relaxation, self-care, processing events, and bolstering coping skills.   Treatment Level Weekly  Symptoms  Anxiety: Feeling anxiety, difficulty stopping worry, worrying about different things, trouble relaxing, irritability, and feeling afraid something awful might happen.   (Status:  maintained) Depression: Loss of interest, fluctuating sleep, fluctuating appetite, feeling bad about self, difficulty concentrating.    (Status: maintained)  Goals:   Brandon Jackson experiences symptoms of of depression and anxiety.   Treatment plan signed and available on s-drive:   No, pending signature via MyChart.  Brandon Jackson was sent the treatment plan signature form on 04/24/24.   Target Date: 04/23/25 Frequency: Weekly  Progress: 0 Modality: individual    Therapist will provide referrals for additional resources as appropriate.  Therapist will provide psycho-education regarding Seichi's diagnosis and corresponding treatment approaches and interventions. [name], LCSW will support the patient's ability to achieve the goals identified. will employ CBT, BA, Problem-solving, Solution Focused, Mindfulness,  coping skills, & other evidenced-based practices will be used to promote progress towards healthy functioning to help manage decrease symptoms associated with his diagnosis.   Reduce overall level, frequency, and intensity of the feelings of depression, anxiety and panic evidenced by decreased overall symptoms from 6 to 7 days/week to 0 to 1 days/week per client report for at least 3 consecutive months. Verbally express understanding of the relationship between feelings of depression, anxiety and their impact on thinking patterns and behaviors. Verbalize an understanding of the role that distorted thinking plays in creating fears, excessive worry, and ruminations.    Tatiana participated in the creation of the treatment plan)   [name], LCSW

## 2024-09-03 DIAGNOSIS — M5126 Other intervertebral disc displacement, lumbar region: Secondary | ICD-10-CM | POA: Diagnosis not present

## 2024-09-03 DIAGNOSIS — M5412 Radiculopathy, cervical region: Secondary | ICD-10-CM | POA: Diagnosis not present

## 2024-09-03 DIAGNOSIS — M545 Low back pain, unspecified: Secondary | ICD-10-CM | POA: Diagnosis not present

## 2024-09-03 DIAGNOSIS — M47816 Spondylosis without myelopathy or radiculopathy, lumbar region: Secondary | ICD-10-CM | POA: Diagnosis not present

## 2024-09-03 DIAGNOSIS — M7918 Myalgia, other site: Secondary | ICD-10-CM | POA: Diagnosis not present

## 2024-09-03 NOTE — Therapy (Signed)
 OUTPATIENT PHYSICAL THERAPY THORACOLUMBAR EVALUATION   Patient Name: Brandon Jackson MRN: [id number] DOB:November 29, 1980, 43 y.o., male Today's Date: 09/04/2024  END OF SESSION:  PT End of Session - 09/04/24 1434     Visit Number 1    Number of Visits 6    Date for Recertification  10/16/24    Authorization Type [employer] 30% [employer]URANCE    Progress Note Due on Visit 10    PT Start Time 1434    PT Stop Time 1519    PT Time Calculation (min) 45 min    Activity Tolerance Patient tolerated treatment well    Behavior During Therapy WFL for tasks assessed/performed          Past Medical History:  Diagnosis Date   Anxiety    Depression    Frequent headaches    Gout    History of chicken pox    Migraines    Ulcer    Past Surgical History:  Procedure Laterality Date   CYSTECTOMY  2003   TONSILLECTOMY AND ADENOIDECTOMY  2007   Wisdom Teeth Removal  1998   Patient Active Problem List   Diagnosis Date Noted   Back pain 04/02/2024   MDD (major depressive disorder), recurrent, in full remission 03/19/2024   Attention and concentration deficit 03/19/2024   Epididymitis 06/15/2023   Cold sore 10/13/2022   History of URI (upper respiratory infection) 10/13/2022   Gout 05/19/2022   Change in weight 05/19/2022   On pre-exposure prophylaxis for HIV 05/19/2022   MDD (major depressive disorder), recurrent episode, mild 03/03/2022   Insomnia due to other mental disorder 03/03/2022   Insomnia due to medical condition 12/23/2021   GAD (generalized anxiety disorder) 10/16/2021   Major depressive disorder, recurrent episode, moderate (HCC) 10/16/2021   Counseling on health promotion and disease prevention 10/14/2021   Routine general medical examination at a health care facility 12/14/2015   Advance care planning 12/14/2015   HLD (hyperlipidemia) 01/28/2015   Hyperglycemia 01/28/2015   Major depression 05/22/2014    PCP: Arlyss GORMAN Solian, MD   REFERRING PROVIDER: Arlyss GORMAN Solian,  MD   REFERRING DIAG: M54.9 (ICD-10-CM) - Back pain, unspecified back location, unspecified back pain laterality, unspecified chronicity  Rationale for Evaluation and Treatment: Rehabilitation  THERAPY DIAG:  Other low back pain  Radiculopathy, lumbosacral region  Other abnormalities of gait and mobility  Other symptoms and signs involving the musculoskeletal system  Abnormal posture  ONSET DATE: February 2025, worsened again several times since then (April was worst)  SUBJECTIVE:  SUBJECTIVE STATEMENT: I can really tell when I am sitting and leaning forward to grab something or do the laundry.  PERTINENT HISTORY:  Patient endorsing lower left lumbar pain with insidious onset following decrease in physical activity secondary to chronic epididymitis in late 2024. Patient has undergone pelvic floor PT in the past but noted that those exercises were causing an increase in pain. Patient also endorses that he believes what has worsened the pain is only sleeping in one comfortable position. Patient endorses no previous injuries, trauma, or surgeries to the area or surrounding joints. Patient has not received injections.  Patient wears a belt/brace to assist with long car rides.  See PMH or personal factors for in depth comorbidities   PAIN:  Are you having pain? Yes: NPRS scale: 5-6/10 at best, 7-9/10 max  Pain location: Lt lower back, intermittent sciatic pain down Lt LE (less frequent) Pain description: sharp, achy, intermittent nerve pain Aggravating factors: bending, lifting, twisting, activity  Relieving factors: heat, TENS  PRECAUTIONS: None  RED FLAGS: Bowel or bladder incontinence: No and Cauda equina syndrome: No   WEIGHT BEARING RESTRICTIONS: No  FALLS:  Has patient fallen in last 6  months? No  LIVING ENVIRONMENT: Lives with: lives alone Lives in: House/apartment Stairs: No Has following equipment at home: None  OCCUPATION: marketing/communications (desk), photography   PLOF: Independent  PATIENT GOALS: be more comfortable moving, being more flexible   NEXT MD VISIT: not currently scheduled   OBJECTIVE:  Note: Objective measures were completed at Evaluation unless otherwise noted.  DIAGNOSTIC FINDINGS:  IMPRESSION: 1. Mild multilevel lumbar spondylosis without significant spinal stenosis or overt neural impingement. 2. Mild right L4 foraminal narrowing no other significant foraminal encroachment within the lumbar spine.  PATIENT SURVEYS:  PSFS: THE PATIENT SPECIFIC FUNCTIONAL SCALE  Place score of 0-10 (0 = unable to perform activity and 10 = able to perform activity at the same level as before injury or problem)  Activity Date: 09/04/2024    Bending  5    2. Lifting  7    3. Stretching  5    4. Sleeping  6    5. Sitting to standing 4    Total Score 5.4      Total Score = Sum of activity scores/number of activities  Minimally Detectable Change: 3 points (for single activity); 2 points (for average score)  [facility] (nd). The Patient Specific Functional Scale . Retrieved from [email or web address]   COGNITION: Overall cognitive status: Within functional limits for tasks assessed     SENSATION: Light touch: WFL  MUSCLE LENGTH: Not objectively measured, though appears tight with straight leg raise and patient endorsing difficulty with some positions   POSTURE: rounded shoulders, forward head, and increased thoracic kyphosis  PALPATION: Not TTP in lumbar musculature, bilat glutes, bilat SIJ or sacrum  LUMBAR ROM: painful when returning to starting position in most ROM  AROM Eval 09/04/2024  Flexion [facility]  [facility]  Right lateral flexion WFL  Left lateral flexion WFL   Right rotation [facility]  Left rotation WFL   (Blank rows = not tested)  LOWER EXTREMITY ROM:     Passive  Right Eval 09/04/2024 Left Eval 09/04/2024  Hip flexion [facility]  Hip extension    Hip abduction    Hip adduction    Hip internal rotation    Hip external rotation    Knee flexion    Knee extension    Ankle dorsiflexion    Ankle plantarflexion  Ankle inversion    Ankle eversion     (Blank rows = not tested)  LOWER EXTREMITY MMT:    MMT Right Eval 09/04/2024 Left Eval 09/04/2024  Hip flexion (seated) 5/5 5/5  Hip extension (prone) 5/5 5/5  Hip abduction (sidelying) 5/5 5/5  Hip adduction    Hip internal rotation    Hip external rotation    Knee flexion (seated) 5/5 5/5  Knee extension (seated) 5/5 5/5  Ankle dorsiflexion (seated) 5/5 5/5  Ankle plantarflexion    Ankle inversion    Ankle eversion     (Blank rows = not tested)  LUMBAR SPECIAL TESTS:  Straight leg raise test: Negative, Slump test: Negative, FABER test: Negative, and FADIR negative   FUNCTIONAL TESTS:    GAIT: Distance walked: not objectively measured  Assistive device utilized: None Level of assistance: supervision Comments: no overt gait deviations noted   TREATMENT DATE:  09/04/2024 TherEx:  HEP handout provided with patient performing one set of each activity for appropriate form. Verbal and tactile cues provided.           PT discussed dry needling options.                                 Self-Care:  POC, importance of completing HEP with the combination of dry needling and in session activities                                                                                          PATIENT EDUCATION:  Education details: HEP, POC, dry needling  Person educated: Patient Education method: Explanation, Demonstration, Tactile cues, Verbal cues, and Handouts Education comprehension: verbalized understanding, returned demonstration, verbal cues required, and tactile cues  required  HOME EXERCISE PROGRAM: Access Code: [email or web address] URL: [email or web address] Date: 09/04/2024 Prepared by: Susannah Daring  Exercises - Child's Pose Stretch  - 1 x daily - 7 x weekly - 3 sets - 30s hold - Child's Pose with Sidebending  - 1 x daily - 7 x weekly - 3 sets - 30s hold - Standing Quadratus Lumborum Stretch with Doorway  - 1 x daily - 7 x weekly - 3 sets - 30s hold - Supine Hamstring Stretch with Strap  - 1 x daily - 7 x weekly - 3 sets - 15-30s hold - Seated Figure 4 Piriformis Stretch  - 1 x daily - 7 x weekly - 3 sets - 30s hold - Standing Hip Flexor Stretch  - 1 x daily - 7 x weekly - 3 sets - 30s hold  ASSESSMENT:  CLINICAL IMPRESSION: Patient is a 43 y.o. M who was seen today for physical therapy evaluation and treatment for low back pain presenting with functional mobility deficits, fear of movement, coordination deficits, postural deficits, and pain. Patient mainly limited by fear/anxiety of movement secondary to high levels of pain when muscle catches/spasms. Patient will benefit from skilled PT to address above noted deficits.   OBJECTIVE IMPAIRMENTS: decreased activity tolerance, decreased coordination, decreased endurance, increased muscle spasms, impaired flexibility, improper body mechanics, postural dysfunction, and pain.  ACTIVITY LIMITATIONS: lifting, bending, sitting, standing, sleeping, bed mobility, and toileting  PARTICIPATION LIMITATIONS: cleaning, laundry, community activity, and occupation  PERSONAL FACTORS: Past/current experiences, Time since onset of injury/illness/exacerbation, and 3+ comorbidities: gout, depression, anxiety, migraines, chronic epididymitis, HLD are also affecting patient's functional outcome.   REHAB POTENTIAL: Good  CLINICAL DECISION MAKING: Stable/uncomplicated  EVALUATION COMPLEXITY: Low   GOALS: Goals reviewed with patient? Yes  SHORT TERM GOALS: Target date: 09/25/2024  Patient will show  compliance with initial HEP.  Baseline: Goal status: INITIAL  2.  Patient will report pain levels no greater than 5/10 in order to show improved overall quality of life. Baseline:  Goal status: INITIAL    LONG TERM GOALS: Target date: 10/16/2024  Patient will be independent with final HEP in order to maintain and progress upon functional gains made within PT.  Baseline:  Goal status: INITIAL  2.  Patient will report pain levels no greater than 3/10 in order to show improved overall quality of life. Baseline:  Goal status: INITIAL  3.  Patient will increase PSFS to at least 7.4 in order to show a significant improvement in subjective disability rating.  Baseline:  Goal status: INITIAL  4.  Patient will report ability to perform household tasks, specifically laundry, with improved anxiety/fear levels with pain.  Baseline:  Goal status: INITIAL     PLAN:  PT FREQUENCY: 1x/week  PT DURATION: 6 weeks  PLANNED INTERVENTIONS: [id number]- PT Re-evaluation, 97750- Physical Performance Testing, 97110-Therapeutic exercises, 97530- Therapeutic activity, [id number]- Neuromuscular re-education, 97535- Self Care, [id number]- Manual therapy, [id number]- Gait training, [phone]- Orthotic Initial, [phone]- Orthotic/Prosthetic subsequent, [phone]- Canalith repositioning, [phone]- Aquatic Therapy, [phone]- Electrical stimulation (unattended), [phone]- Electrical stimulation (manual), [id number]- Vasopneumatic device, [id number]- Ultrasound, [id number]- Traction (mechanical), [id number]- Ionotophoresis 4mg /ml Dexamethasone, 79439 (1-2 muscles), [id number] (3+ muscles)- Dry Needling, Patient/Family education, Balance training, Stair training, Taping, Joint mobilization, Joint manipulation, Spinal manipulation, Spinal mobilization, Compression bandaging, Vestibular training, DME instructions, Cryotherapy, and Moist heat.  PLAN FOR NEXT SESSION: review HEP, SIJ assessment, dry needling (?), general flexibility and strengthening for biomechanics purposes,  postural strengthening    Susannah Daring, PT, DPT 09/04/24 4:04 PM

## 2024-09-04 ENCOUNTER — Ambulatory Visit

## 2024-09-04 DIAGNOSIS — M5459 Other low back pain: Secondary | ICD-10-CM

## 2024-09-04 DIAGNOSIS — R2689 Other abnormalities of gait and mobility: Secondary | ICD-10-CM

## 2024-09-04 DIAGNOSIS — M5417 Radiculopathy, lumbosacral region: Secondary | ICD-10-CM

## 2024-09-04 DIAGNOSIS — R29898 Other symptoms and signs involving the musculoskeletal system: Secondary | ICD-10-CM | POA: Diagnosis not present

## 2024-09-04 DIAGNOSIS — R293 Abnormal posture: Secondary | ICD-10-CM

## 2024-09-05 ENCOUNTER — Emergency Department (HOSPITAL_BASED_OUTPATIENT_CLINIC_OR_DEPARTMENT_OTHER): Admitting: Radiology

## 2024-09-05 ENCOUNTER — Encounter (HOSPITAL_BASED_OUTPATIENT_CLINIC_OR_DEPARTMENT_OTHER): Payer: Self-pay | Admitting: Emergency Medicine

## 2024-09-05 ENCOUNTER — Ambulatory Visit: Payer: Self-pay

## 2024-09-05 ENCOUNTER — Emergency Department (HOSPITAL_BASED_OUTPATIENT_CLINIC_OR_DEPARTMENT_OTHER)
Admission: EM | Admit: 2024-09-05 | Discharge: 2024-09-05 | Disposition: A | Discharge status: Home / Self Care | Attending: Emergency Medicine | Admitting: Emergency Medicine

## 2024-09-05 DIAGNOSIS — R0789 Other chest pain: Secondary | ICD-10-CM | POA: Insufficient documentation

## 2024-09-05 DIAGNOSIS — R079 Chest pain, unspecified: Secondary | ICD-10-CM | POA: Diagnosis not present

## 2024-09-05 LAB — CBC
HCT: 44.8 % (ref 39.0–52.0)
Hemoglobin: 15.5 g/dL (ref 13.0–17.0)
MCH: 31.9 pg (ref 26.0–34.0)
MCHC: 34.6 g/dL (ref 30.0–36.0)
MCV: 92.2 fL (ref 80.0–100.0)
Platelets: 221 K/uL (ref 150–400)
RBC: 4.86 MIL/uL (ref 4.22–5.81)
RDW: 12.3 % (ref 11.5–15.5)
WBC: 7.2 K/uL (ref 4.0–10.5)
nRBC: 0 % (ref 0.0–0.2)

## 2024-09-05 LAB — BASIC METABOLIC PANEL WITH GFR
Anion gap: 9 (ref 5–15)
BUN: 15 mg/dL (ref 6–20)
CO2: 28 mmol/L (ref 22–32)
Calcium: 10.6 mg/dL — ABNORMAL HIGH (ref 8.9–10.3)
Chloride: 103 mmol/L (ref 98–111)
Creatinine, Ser: 0.92 mg/dL (ref 0.61–1.24)
GFR, Estimated: >60 mL/min (ref >60)
Glucose, Bld: 116 mg/dL — ABNORMAL HIGH (ref 70–99)
Potassium: 4.3 mmol/L (ref 3.5–5.1)
Sodium: 140 mmol/L (ref 135–145)

## 2024-09-05 LAB — TROPONIN T, HIGH SENSITIVITY: Troponin T High Sensitivity: <15 ng/L (ref 0–19)

## 2024-09-05 NOTE — ED Provider Notes (Signed)
 [place] EMERGENCY DEPARTMENT AT [facility] Provider Note   CSN: [id number] Arrival date & time: 09/05/24  1115     Patient presents with: Chest Pain   Brandon Jackson is a 43 y.o. male history of anxiety presents with complaints of nonradiating central chest pain that started last night. Pain is constant without any aggravating or relieving factors.  Is not associate with any shortness of breath, dizziness or nausea.  Symptoms are nonexertional.  Is not associated with any cough or other URI symptoms.  Has never had the symptoms before.  Has no history of chest pain or prior blood clots.  No recent surgeries, travel or hospitalizations.    Does report strong family history of cardiac disease.   =   Chest Pain     Past Medical History:  Diagnosis Date   Anxiety    Depression    Frequent headaches    Gout    History of chicken pox    Migraines    Ulcer      Prior to Admission medications   Medication Sig Start Date End Date Taking? Authorizing Provider  allopurinol  (ZYLOPRIM ) 100 MG tablet Take 1 tablet by mouth twice daily. 01/18/24   Cleatus Arlyss RAMAN, MD  Ascorbic Acid (VITAMIN C PO) Take by mouth.    [provider]  B Complex Vitamins (VITAMIN B COMPLEX PO) Take by mouth.    [provider]  buPROPion  ER (WELLBUTRIN  SR) 100 MG 12 hr tablet Take 1 tablet (100 mg total) by mouth 2 (two) times daily. 09/26/23   Eappen, Saramma, MD  busPIRone  (BUSPAR ) 10 MG tablet Take 2 tablets (20 mg total) by mouth 3 (three) times daily. 09/26/23   Eappen, Saramma, MD  cetirizine  (ZYRTEC ) 10 MG tablet Take 1 tablet (10 mg total) by mouth daily. 10/17/22   Cleatus Arlyss RAMAN, MD  clonazePAM  (KLONOPIN ) 0.5 MG tablet TAKE 1/2 TO 1 TABLET BY MOUTH TWICE A DAY IF NEEDED FOR ANXIETY AND 1/2 TO 1 TABLET AT NIGHT IF NEEDED FOR INSOMNIA, CAUTION SEDATION. 04/25/24   Cleatus Arlyss RAMAN, MD  Cranberry 500 MG CAPS Take by mouth.    [provider]   emtricitabine -tenofovir  (TRUVADA) 200-300 MG tablet Take 1 tablet by mouth daily.  Please schedule your yearly visit for summer 2025. 05/30/24   Cleatus Arlyss RAMAN, MD  fluticasone  (FLONASE ) 50 MCG/ACT nasal spray Place 1 spray into both nostrils as needed.    [provider]  hydrOXYzine  (VISTARIL ) 25 MG capsule Take 1 to 3 capsules by mouth at bedtime as needed for anxiety. 04/11/24   Eappen, Saramma, MD  levocetirizine (XYZAL  ALLERGY 24HR) 5 MG tablet Take 1 tablet (5 mg total) by mouth every evening for 5 days. 05/25/24 05/30/24  Rolan Berthold, PA-C  Lysine 1000 MG TABS  08/23/22   [provider]  meloxicam  (MOBIC ) 15 MG tablet Take 1 tablet (15 mg total) by mouth as needed. 03/29/24   Cleatus Arlyss RAMAN, MD  Multiple Vitamin (MULTIVITAMIN) tablet Take 1 tablet by mouth daily.    [provider]  Omega-3 Fatty Acids (FISH OIL) 300 MG CAPS Take 300 mg by mouth daily.    [provider]  predniSONE  (DELTASONE ) 10 MG tablet Take 2 a day for 5 days, then 1 a day for 5 days, with food. Don't take with aleve/ibuprofen/meloxicam . 04/25/24   Cleatus Arlyss RAMAN, MD  tadalafil (CIALIS) 5 MG tablet Take 5 mg by mouth daily. 08/24/23   [provider]  valACYclovir  (VALTREX ) 500 MG tablet TAKE 1 TABLET (500 MG TOTAL) BY MOUTH DAILY. 11/16/23   Cleatus Arlyss RAMAN, MD    Allergies: Wellbutrin  [bupropion ] and Erythromycin    Review of Systems  Cardiovascular:  Positive for chest pain.    Updated Vital Signs BP (!) 153/93 (BP Location: Right Arm)   Pulse 84   Temp 98 F (36.7 C) (Oral)   Resp 17   SpO2 97%   Physical Exam Vitals and nursing note reviewed.  Constitutional:      General: He is not in acute distress.    Appearance: He is well-developed.  HENT:     Head: Normocephalic and atraumatic.  Eyes:     Conjunctiva/sclera: Conjunctivae normal.  Cardiovascular:     Rate and Rhythm: Normal rate and regular rhythm.     Heart sounds: No murmur  heard. Pulmonary:     Effort: Pulmonary effort is normal. No respiratory distress.     Breath sounds: Normal breath sounds.  Chest:     Chest wall: No tenderness.  Abdominal:     Palpations: Abdomen is soft.     Tenderness: There is no abdominal tenderness.  Musculoskeletal:        General: No swelling.     Cervical back: Neck supple.  Skin:    General: Skin is warm and dry.     Capillary Refill: Capillary refill takes less than 2 seconds.  Neurological:     Mental Status: He is alert.  Psychiatric:        Mood and Affect: Mood normal.     (all labs ordered are listed, but only abnormal results are displayed) Labs Reviewed  BASIC METABOLIC PANEL WITH GFR - Abnormal; Notable for the following components:      Result Value   Glucose, Bld 116 (*)    Calcium 10.6 (*)    All other components within normal limits  CBC  TROPONIN T, HIGH SENSITIVITY  TROPONIN T, HIGH SENSITIVITY    EKG: None  Radiology: No results found.   Procedures   Medications Ordered in the ED - No data to display  Clinical Course as of 09/08/24 1650  Wed Sep 05, 2024  1246 ED EKG [EA]  1321 ED EKG Sinus rhythm, borderline T wave abnormality [JT]  1334 Patient without any significant cardiovascular risk factors evaluated for complaints of chest pain that started last night.  Upon arrival he is hemodynamically stable.  His lung sounds are clear.  He has no reproducible chest wall tenderness.  No overlying lesions.  .  His EKG is without any significant ischemic changes.  His troponin is without elevation.  Overall etiology is unclear at this time however with a low heart score and PERC negative patient is cleared for discharge home with strict return precautions and close PCP follow-up. [JT]  1436 DG Chest 2 View No acute abnormality [JT]  1436 CBC Unremarkable [JT]  1436 Basic metabolic panel(!) No significant abnormality [JT]  1436 Troponin T, High Sensitivity Without elevation [JT]     Clinical Course User Index [EA] Rosina Almarie LABOR, PA-C [JT] Donnajean Lynwood DEL, PA-C                                 Medical Decision Making Amount and/or Complexity of Data Reviewed Labs: ordered. Decision-making details documented in ED Course. Radiology: ordered. Decision-making details documented in ED Course. ECG/medicine tests:  Decision-making details documented in  ED Course.   This patient presents to the ED with chief complaint(s) of chest pain.  The complaint involves an extensive differential diagnosis and also carries with it a high risk of complications and morbidity.   Pertinent past medical history as listed in HPI  The differential diagnosis includes  Patient is without EKG changes and troponin without elevation.  Do not suspect ACS.  He is PERC negative, do not suspect PE.  Low suspicion for aortic dissection at this time.  He has no infectious symptoms and his chest x-ray is without consolidation.  Do not suspect pneumonia.  Additional history obtained: Records reviewed Care Everywhere/External Records  Disposition:   Patient will be discharged home. The patient has been appropriately medically screened and/or stabilized in the ED. I have low suspicion for any other emergent medical condition which would require further screening, evaluation or treatment in the ED or require inpatient management. At time of discharge the patient is hemodynamically stable and in no acute distress. I have discussed work-up results and diagnosis with patient and answered all questions. Patient is agreeable with discharge plan. We discussed strict return precautions for returning to the emergency department and they verbalized understanding.     Social Determinants of Health:   none  This note was dictated with voice recognition software.  Despite best efforts at proofreading, errors may have occurred which can change the documentation meaning.       Final diagnoses:  Atypical  chest pain    ED Discharge Orders     None          Kimblery Diop H, PA-C 09/08/24 1650    Tonia Chew, MD 09/10/24 1507

## 2024-09-05 NOTE — ED Notes (Signed)
 Reviewed discharge instructions and follow-up care with pt. Pt verbalized understanding and had no further questions. Pt exited ED without complications.

## 2024-09-05 NOTE — Telephone Encounter (Signed)
 FYI Only or Action Required?: FYI only for provider: ED advised.  Patient was last seen in primary care on 05/25/2024 by Rolan Berthold, PA-C.  Called Nurse Triage reporting Chest Pain.  Symptoms began yesterday.  Interventions attempted: Nothing.  Symptoms are: gradually worsening.  Triage Disposition: Go to ED Now (Notify PCP)  Patient/caregiver understands and will follow disposition?: Yes  **See note below**          Copied from CRM #[id number]. Topic: Clinical - Red Word Triage >> Sep 05, 2024 10:22 AM Robinson H wrote: [facility] that prompted transfer to Nurse Triage: Having low grade chest pains started last night, left of center noticeable but at work Reason for Disposition  [1] Chest pain (or angina) comes and goes AND [2] is happening more often (increasing in frequency) or getting worse (increasing in severity)  (Exception: Chest pains that last only a few seconds.)  Answer Assessment - Initial Assessment Questions 1. LOCATION: Where does it hurt?       Sharp pain in left center of chest, intermittent    2. RADIATION: Does the pain go anywhere else? (e.g., into neck, jaw, arms, back)     No   3. ONSET: When did the chest pain begin? (Minutes, hours or days)      X1 day last night   4. PATTERN: Does the pain come and go, or has it been constant since it started?  Does it get worse with exertion?      Intermittent   5. DURATION: How long does it last (e.g., seconds, minutes, hours)     X 1 hour   6. SEVERITY: How bad is the pain?  (e.g., Scale 1-10; mild, moderate, or severe)     5/10  7. CARDIAC RISK FACTORS: Do you have any history of heart problems or risk factors for heart disease? (e.g., angina, prior heart attack; diabetes, high blood pressure, high cholesterol, smoker, or strong family history of heart disease)     No   8. PULMONARY RISK FACTORS: Do you have any history of lung disease?  (e.g., blood clots in lung, asthma,  emphysema, birth control pills)     No   9. CAUSE: What do you think is causing the chest pain?     Unknown   10. OTHER SYMPTOMS: Do you have any other symptoms? (e.g., dizziness, nausea, vomiting, sweating, fever, difficulty breathing, cough)  No  Patient called in stating he has been having intermittent chest pain since last night, its lasting about an hour, and comes and goes, he is rating the pain as a 5/10, however the pain is intensifying while he's at work currently, He describes the pain as  sharp. Referred to ED; patient agrees with plan of care. He will follow-up at a later time.  Protocols used: Chest Pain-A-AH

## 2024-09-05 NOTE — ED Triage Notes (Signed)
 Left side chest pain started last night around 7 pm Stopped when asleep returned this AM Denies SOB, no n/v

## 2024-09-05 NOTE — Telephone Encounter (Signed)
 Noted. Thanks.

## 2024-09-05 NOTE — Progress Notes (Signed)
 Agree.  Thanks.  Arlyss EDISON Cleatus, MD 09/05/24  12:29 PM

## 2024-09-05 NOTE — Discharge Instructions (Signed)
 You were evaluated in the emergency room for chest pain.  Your lab work and imaging did not show any significant abnormality.  Please follow-up with your primary care doctor for further evaluation.  If you experience any new or worsening symptoms including worsening chest pain, shortness of breath, nausea please return emergency room.

## 2024-09-05 NOTE — ED Notes (Signed)
 ED Provider at bedside.

## 2024-09-10 ENCOUNTER — Other Ambulatory Visit: Payer: Self-pay

## 2024-09-10 ENCOUNTER — Encounter: Payer: Self-pay | Admitting: Psychiatry

## 2024-09-10 ENCOUNTER — Ambulatory Visit: Admitting: Psychiatry

## 2024-09-10 VITALS — BP 110/78 | HR 75 | Temp 97.3°F | Ht 67.5 in | Wt 210.2 lb

## 2024-09-10 DIAGNOSIS — F411 Generalized anxiety disorder: Secondary | ICD-10-CM | POA: Diagnosis not present

## 2024-09-10 DIAGNOSIS — F5105 Insomnia due to other mental disorder: Secondary | ICD-10-CM

## 2024-09-10 DIAGNOSIS — F3342 Major depressive disorder, recurrent, in full remission: Secondary | ICD-10-CM | POA: Diagnosis not present

## 2024-09-10 DIAGNOSIS — F99 Mental disorder, not otherwise specified: Secondary | ICD-10-CM | POA: Diagnosis not present

## 2024-09-10 MED ORDER — DOXEPIN HCL 10 MG PO CAPS: 10.0000 mg | ORAL_CAPSULE | Freq: Every evening | ORAL | 1 refills | Status: DC | PRN

## 2024-09-10 MED ORDER — BUPROPION HCL ER (SR) 100 MG PO TB12: 100.0000 mg | ORAL_TABLET | Freq: Two times a day (BID) | ORAL | 3 refills | Status: AC

## 2024-09-10 MED ORDER — BUSPIRONE HCL 15 MG PO TABS: 15.0000 mg | ORAL_TABLET | Freq: Two times a day (BID) | ORAL | 1 refills | Status: DC

## 2024-09-10 NOTE — Patient Instructions (Signed)
Doxepin Capsules What is this medication? DOXEPIN (DOX e pin) treats depression and anxiety. It increases the amount of serotonin and norepinephrine in the brain, hormones that help regulate mood. It belongs to a group of medications called tricyclic antidepressants (TCAs). This medicine may be used for other purposes; ask your health care provider or pharmacist if you have questions. COMMON BRAND NAME(S): Sinequan What should I tell my care team before I take this medication? They need to know if you have any of these conditions: Bipolar disorder Difficulty passing urine Frequently drink alcohol Glaucoma Heart disease Liver disease Lung or breathing disease, such as asthma or sleep apnea Prostate trouble Schizophrenia Seizures Suicidal thoughts, plans, or attempt by you or a family member An unusual or allergic reaction to doxepin, other medications, foods, dyes, or preservatives Pregnant or trying to get pregnant Breastfeeding How should I use this medication? Take this medication by mouth with a glass of water. Follow the directions on the prescription label. Take your doses at regular intervals. Do not take your medication more often than directed. Do not stop taking this medication suddenly except upon the advice of your care team. Stopping this medication too quickly may cause serious side effects or your condition may worsen. A special MedGuide will be given to you by the pharmacist with each prescription and refill. Be sure to read this information carefully each time. Talk to your care team about the use of this medication in children. While this medication may be prescribed for children as young as 12 years for selected conditions, precautions do apply. Overdosage: If you think you have taken too much of this medicine contact a poison control center or emergency room at once. NOTE: This medicine is only for you. Do not share this medicine with others. What if I miss a dose? If  you miss a dose, take it as soon as you can. If it is almost time for your next dose, take only that dose. Do not take double or extra doses. What may interact with this medication? Do not take this medication with any of the following: Arsenic trioxide Certain medications for irregular heartbeat or other heart conditions Cisapride Halofantrine Levomethadyl Linezolid MAOIs, such as Carbex, Eldepryl, Marplan, Nardil, and Parnate Methylene blue Other medications for depression Phenothiazines, such as perphenazine, thioridazine, and chlorpromazine Pimozide Procarbazine Sparfloxacin St. John's Wort This medication may also interact with the following: Cimetidine Tolazamide Ziprasidone This list may not describe all possible interactions. Give your health care provider a list of all the medicines, herbs, non-prescription drugs, or dietary supplements you use. Also tell them if you smoke, drink alcohol, or use illegal drugs. Some items may interact with your medicine. What should I watch for while using this medication? Visit your care team for regular checks on your progress. It can take several days before you feel the full effect of this medication. If you have been taking this medication regularly for some time, do not suddenly stop taking it. You must gradually reduce the dose, or you may get severe side effects. Ask your care team for advice. Even after you stop taking this medication it can still affect your body for several days. Patients and their families should watch out for new or worsening thoughts of suicide or depression. Also watch out for sudden changes in feelings such as feeling anxious, agitated, panicky, irritable, hostile, aggressive, impulsive, severely restless, overly excited and hyperactive, or not being able to sleep. If this happens, especially at the beginning of  treatment or after a change in dose, call your care team. This medication may affect your coordination,  reaction time, or judgment. Do not drive or operate machinery until you know how this medication affects you. Sit up or stand slowly to reduce the risk of dizzy or fainting spells. Drinking alcohol with this medication can increase the risk of these side effects. Do not treat yourself for coughs, colds, or allergies without asking your care team for advice. Some ingredients can increase possible side effects. Your mouth may get dry. Chewing sugarless gum or sucking hard candy and drinking plenty of water may help. Contact your care team if the problem does not go away or is severe. This medication may cause dry eyes and blurred vision. If you wear contact lenses you, may feel some discomfort. Lubricating eye drops may help. See your care team if the problem does not go away or is severe. This medication can make you more sensitive to the sun. Keep out of the sun. If you cannot avoid being in the sun, wear protective clothing and sunscreen. Do not use sun lamps, tanning beds, or tanning booths. What side effects may I notice from receiving this medication? Side effects that you should report to your care team as soon as possible: Allergic reactions--skin rash, itching, hives, swelling of the face, lips, tongue, or throat Irritability, confusion, fast or irregular heartbeat, muscle stiffness, twitching muscles, sweating, high fever, seizure, chills, vomiting, diarrhea, which may be signs of serotonin syndrome Sudden eye pain or change in vision such as blurry vision, seeing halos around lights, vision loss Thoughts of suicide or self-harm, worsening mood, or feelings of depression Trouble passing urine Side effects that usually do not require medical attention (report to your care team if they continue or are bothersome): Change in sex drive or performance Constipation Dizziness Drowsiness Dry mouth Tremors Weight gain This list may not describe all possible side effects. Call your doctor for  medical advice about side effects. You may report side effects to FDA at 1-800-FDA-1088. Where should I keep my medication? Keep out of the reach of children. Store at room temperature between 15 and 30 degrees C (59 and 86 degrees F). Throw away any unused medication after the expiration date. NOTE: This sheet is a summary. It may not cover all possible information. If you have questions about this medicine, talk to your doctor, pharmacist, or health care provider.  2024 Elsevier/Gold Standard (2022-05-27 00:00:00)

## 2024-09-10 NOTE — Progress Notes (Signed)
 [facility] MD OP Progress Note  09/10/2024 9:38 AM Brandon Jackson  MRN:  [id number]  Chief Complaint:  Chief Complaint  Patient presents with   Follow-up   Depression   Anxiety   Medication Refill   Discussed the use of AI scribe software for clinical note transcription with the patient, who gave verbal consent to proceed.  History of Present Illness Brandon Jackson is a 43 year old Caucasian male currently employed, lives in [place], has a history of MDD, GAD, hyperlipidemia, insomnia was evaluated in office today for a follow-up appointment.  He continues to have ongoing difficulty with sleep, including trouble falling asleep and frequent awakenings during the night. He describes poor sleep depth and often wakes after only a short period, sometimes due to external factors or discomfort. While he experiences racing thoughts less frequently than before, he still occasionally has episodes that interfere with his ability to fall asleep. He often feels tired in the mornings, especially after taking hydroxyzine  at night, which he uses as needed when he feels wound up. Hydroxyzine  does not consistently help him fall asleep and leaves him feeling groggy the next day. He recently experienced a significant episode of sleep disruption related to travel and increased physical discomfort.  He reports that his anxiety has improved and is generally much less than before. He feels less scattered and more able to focus at work, though he still has occasional periods of distractibility. When he needs to focus on a task, he reports being able to do so effectively. He continues to take Buspar  20 mg three times daily and Wellbutrin , with his second dose of Wellbutrin  typically taken around 2-3 PM. He experiences transient jitteriness and sweating about 20 minutes after taking Buspar , which he manages by taking it with food. This side effect resolves within about an hour. He notes that anxiety was previously a  significant issue but now feels better controlled.  He reports that his appetite remains generally good and has increased somewhat, with a particular preference for carbohydrates and pasta recently. He denies needing to nap during the day, though he sometimes feels fatigued and would like to rest after work. He finds work and his relationship manageable and generally good, though he acknowledges some ongoing differences in his relationship.  He continues to have ongoing back pain and chronic epididymitis, which occasionally flare up and contribute to sleep disruption, particularly after physical activity or sleeping in certain positions. He is no longer taking prednisone .  He denies any suicidality, homicidality or perceptual disturbances.   Visit Diagnosis:    ICD-10-CM   1. MDD (major depressive disorder), recurrent, in full remission  F33.42 busPIRone  (BUSPAR ) 15 MG tablet    doxepin (SINEQUAN) 10 MG capsule    buPROPion  ER (WELLBUTRIN  SR) 100 MG 12 hr tablet    2. GAD (generalized anxiety disorder)  F41.1 busPIRone  (BUSPAR ) 15 MG tablet    doxepin (SINEQUAN) 10 MG capsule    3. Insomnia due to other mental disorder  F51.05 doxepin (SINEQUAN) 10 MG capsule   F99    Anxiety, pain      Past Psychiatric History: I have reviewed past psychiatric history from progress note on 12/23/2021.  Past trials of Pristiq , duloxetine, Lexapro , Lunesta , Effexor , multiple other medications.  Past Medical History:  Past Medical History:  Diagnosis Date   Anxiety    Depression    Frequent headaches    Gout    History of chicken pox    Migraines    Ulcer  Past Surgical History:  Procedure Laterality Date   CYSTECTOMY  2003   TONSILLECTOMY AND ADENOIDECTOMY  2007   Wisdom Teeth Removal  1998    Family Psychiatric History: I have reviewed family psychiatric history from progress note on 12/23/2021.  Family History:  Family History  Problem Relation Age of Onset   Depression Mother     Diabetes Father    Hyperlipidemia Father    Hypertension Father    Colon cancer Neg Hx    Prostate cancer Neg Hx     Social History: I have reviewed social history from progress note on 12/23/2021. Social History   Socioeconomic History   Marital status: Significant Other    Spouse name: Not on file   Number of children: Not on file   Years of education: College   Highest education level: Bachelor's degree (e.g., BA, AB, BS)  Occupational History   Occupation: Photographer: [employer]  Tobacco Use   Smoking status: Former    Current packs/day: 0.00    Types: Cigarettes    Quit date: 2013    Years since quitting: 12.8    Passive exposure: Past   Smokeless tobacco: Never  Vaping Use   Vaping status: Never Used  Substance and Sexual Activity   Alcohol use: Yes    Comment: occasional   Drug use: No   Sexual activity: Not on file  Other Topics Concern   Not on file  Social History Narrative   Single   Was in committed relationship 2007-2023.   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     Social Drivers of Corporate Investment Banker Strain: Low Risk  (03/27/2024)   Overall Financial Resource Strain (CARDIA)    Difficulty of Paying Living Expenses: Not very hard  Food Insecurity: No Food Insecurity (03/27/2024)   Hunger Vital Sign    Worried About Running Out of Food in the Last Year: Never true    Ran Out of Food in the Last Year: Never true  Transportation Needs: No Transportation Needs (03/27/2024)   PRAPARE - Administrator, Civil Service (Medical): No    Lack of Transportation (Non-Medical): No  Physical Activity: Sufficiently Active (03/27/2024)   Exercise Vital Sign    Days of Exercise per Week: 3 days    Minutes of Exercise per Session: 50 min  Stress: No Stress Concern Present (03/27/2024)   [employer] of Occupational Health - Occupational Stress Questionnaire    Feeling of Stress :  Only a little  Social Connections: Socially Isolated (03/27/2024)   Social Connection and Isolation Panel    Frequency of Communication with Friends and Family: Twice a week    Frequency of Social Gatherings with Friends and Family: Twice a week    Attends Religious Services: Never    Database Administrator or Organizations: No    Attends Engineer, Structural: Not on file    Marital Status: Divorced    Allergies:  Allergies  Allergen Reactions   Wellbutrin  [Bupropion ] Other (See Comments)    Anxiety increased on med when taken alone.  He can tolerate wellbutrin  when taken with lexparo.    Erythromycin Diarrhea and Rash    Metabolic Disorder Labs: No results found for: HGBA1C, MPG No results found for: PROLACTIN Lab Results  Component Value Date   CHOL 154 05/17/2022   TRIG 111.0 05/17/2022   HDL 49.60 05/17/2022  CHOLHDL 3 05/17/2022   VLDL 22.2 05/17/2022   LDLCALC 82 05/17/2022   LDLCALC 82 10/12/2021   Lab Results  Component Value Date   TSH 1.24 05/17/2022   TSH 1.310 01/05/2022    Therapeutic Level Labs: No results found for: LITHIUM No results found for: VALPROATE No results found for: CBMZ  Current Medications: Current Outpatient Medications  Medication Sig Dispense Refill   busPIRone  (BUSPAR ) 15 MG tablet Take 1 tablet (15 mg total) by mouth 2 (two) times daily. Dose change , 60 tablet 1   doxepin (SINEQUAN) 10 MG capsule Take 1-2 capsules (10-20 mg total) by mouth at bedtime as needed. 60 capsule 1   allopurinol  (ZYLOPRIM ) 100 MG tablet Take 1 tablet by mouth twice daily. 180 tablet 2   Ascorbic Acid (VITAMIN C PO) Take by mouth.     B Complex Vitamins (VITAMIN B COMPLEX PO) Take by mouth.     buPROPion  ER (WELLBUTRIN  SR) 100 MG 12 hr tablet Take 1 tablet (100 mg total) by mouth 2 (two) times daily. 180 tablet 3   cetirizine  (ZYRTEC ) 10 MG tablet Take 1 tablet (10 mg total) by mouth daily.     clonazePAM  (KLONOPIN ) 0.5 MG tablet TAKE  1/2 TO 1 TABLET BY MOUTH TWICE A DAY IF NEEDED FOR ANXIETY AND 1/2 TO 1 TABLET AT NIGHT IF NEEDED FOR INSOMNIA, CAUTION SEDATION. 20 tablet 1   Cranberry 500 MG CAPS Take by mouth.     emtricitabine -tenofovir  (TRUVADA) 200-300 MG tablet Take 1 tablet by mouth daily.  Please schedule your yearly visit for summer 2025. 90 tablet 0   fluticasone  (FLONASE ) 50 MCG/ACT nasal spray Place 1 spray into both nostrils as needed.     hydrOXYzine  (VISTARIL ) 25 MG capsule Take 1 to 3 capsules by mouth at bedtime as needed for anxiety. 270 capsule 0   levocetirizine (XYZAL  ALLERGY 24HR) 5 MG tablet Take 1 tablet (5 mg total) by mouth every evening for 5 days. 5 tablet 0   Lysine 1000 MG TABS      meloxicam  (MOBIC ) 15 MG tablet Take 1 tablet (15 mg total) by mouth as needed.     Multiple Vitamin (MULTIVITAMIN) tablet Take 1 tablet by mouth daily.     Omega-3 Fatty Acids (FISH OIL) 300 MG CAPS Take 300 mg by mouth daily.     tadalafil (CIALIS) 5 MG tablet Take 5 mg by mouth daily.     valACYclovir  (VALTREX ) 500 MG tablet TAKE 1 TABLET (500 MG TOTAL) BY MOUTH DAILY. 90 tablet 1   No current facility-administered medications for this visit.     Musculoskeletal: Strength & Muscle Tone: within normal limits Gait & Station: normal Patient leans: N/A  Psychiatric Specialty Exam: Review of Systems  Psychiatric/Behavioral:  Positive for sleep disturbance. The patient is nervous/anxious.     Blood pressure 110/78, pulse 75, temperature (!) 97.3 F (36.3 C), temperature source Temporal, height 5' 7.5 (1.715 m), weight 210 lb 3.2 oz (95.3 kg).Body mass index is 32.44 kg/m.  General Appearance: Casual  Eye Contact:  Fair  Speech:  Clear and Coherent  Volume:  Normal  Mood:  Anxious  Affect:  Appropriate  Thought Process:  Goal Directed and Descriptions of Associations: Intact  Orientation:  Full (Time, Place, and Person)  Thought Content: Logical   Suicidal Thoughts:  No  Homicidal Thoughts:  No  Memory:   Immediate;   Fair Recent;   Fair Remote;   Fair  Judgement:  Fair  Insight:  Fair  Psychomotor Activity:  Normal  Concentration:  Concentration: Fair and Attention Span: Fair  Recall:  [employer] of Knowledge: Fair  Language: Fair  Akathisia:  No  Handed:  Right  AIMS (if indicated): not done  Assets:  Manufacturing Systems Engineer Desire for Improvement Housing Social Support Transportation  ADL's:  Intact  Cognition: WNL  Sleep:  Poor   Screenings: AIMS    Flowsheet Row Video Visit from 03/03/2022 in [facility]  AIMS Total Score 0   AUDIT    Flowsheet Row Office Visit from 03/29/2024 in [facility]  Alcohol Use Disorder Identification Test Final Score (AUDIT) 4    GAD-7    Flowsheet Row Office Visit from 09/10/2024 in [facility] Office Visit from 03/29/2024 in [facility] [place] Office Visit from 09/26/2023 in [facility] Office Visit from 07/07/2023 in [facility] Office Visit from 06/14/2023 in [facility]  Total GAD-7 Score 7 7 7 14 6    [employer]    Flowsheet Row Office Visit from 09/10/2024 in [facility] Office Visit from 03/29/2024 in [facility] Office Visit from 09/26/2023 in [facility] Office Visit from 07/07/2023 in [facility] Office Visit from 06/14/2023 in [facility]  PHQ-2 Total Score 0 1 0 2 2  PHQ-9 Total Score -- 10 -- 9 8   Flowsheet Row Office Visit from 09/10/2024 in [facility] ED from 09/05/2024 in [facility] Video Visit from 06/25/2024 in [facility]  C-SSRS RISK CATEGORY No Risk No Risk No Risk     Assessment and Plan: Brandon Jackson is a 43 year old Caucasian male who presented for a follow-up appointment.  Discussed assessment and plan as noted below.  1. MDD (major depressive disorder), recurrent, in full remission Currently denies any significant depression symptoms. Continue Wellbutrin  SR 100 mg twice daily.  Patient advised to take the second dose of Wellbutrin  earlier to see if that helps with sleep. Continue CBT with Mr.Osman mostafa.  2. GAD (generalized anxiety disorder)-improving Does have situational anxiety however overall reports anxiety as better managed.  Currently does report side effects to BuSpar , unknown if BuSpar  is affecting sleep as well.  Agreeable to dosage reduction. Reduce BuSpar  to 15 mg twice daily. Continue Hydroxyzine  25-75 mg at bedtime as needed Continue Clonazepam  0.5 mg as needed for racing thoughts, currently uses it limitedly. Reviewed [place] PMP AWARxE  3. Insomnia due to other mental disorder-unstable Sleep problems due to anxiety, racing thoughts as well as pain. Will start Doxepin 10 to 20 mg at bedtime as needed Advised to continue sleep hygiene techniques Consider referral for sleep study in the future.   Recent emergency department visit in October due to atypical chest pain, was medically cleared however was referred for cardiology consultation, he needs to schedule it.  Agrees to go to the nearest emergency department with any further chest pain as well as medication side effects.  Follow-up Follow-up in clinic in 2 weeks or sooner if needed.   Collaboration of Care: Collaboration of Care: Referral or follow-up with counselor/therapist AEB encouraged to continue psychotherapy sessions.  Patient/Guardian was advised Release of  Information must be obtained prior to any record release in order to collaborate their care with an outside provider.  Patient/Guardian was advised if they have not already done so to contact the registration department to sign all necessary forms in order for us  to release information regarding their care.   Consent: Patient/Guardian gives verbal consent for treatment and assignment of benefits for services provided during this visit. Patient/Guardian expressed understanding and agreed to proceed.   This note was generated in part or whole with voice recognition software. Voice recognition is usually quite accurate but there are transcription errors that can and very often do occur. I apologize for any typographical errors that were not detected and corrected.    Marcelle Bebout, MD 09/10/2024, 9:38 AM

## 2024-09-11 ENCOUNTER — Encounter: Admitting: Physical Therapy

## 2024-09-11 NOTE — Progress Notes (Deleted)
  Cardiology Office Note   Date:  09/11/2024  ID:  Brandon Jackson, DOB Aug 02, 1981, MRN [id number] PCP: Cleatus Arlyss RAMAN, MD  [facility] Providers Cardiologist:  None { Click to update primary MD,subspecialty MD or APP then REFRESH:1}    History of Present Illness Brandon Jackson is a 43 y.o. male with a past medical history of major depressive disorder, generalized anxiety disorder, insomnia, HLD. Presents today for evaluation of chest pain   Patient was seen at the [facility] ED on 09/05/24. Reported that he had chest pain the night prior, resolved when he went to sleep, but returned on the morning of 10/29. In the ED, troponin was negative x1. CXR showed no acute process. EKG nonischemic.     ROS: ***  Studies Reviewed      *** Risk Assessment/Calculations {Does this patient have ATRIAL FIBRILLATION?[phone]}         Physical Exam VS:  There were no vitals taken for this visit.       Wt Readings from Last 3 Encounters:  03/29/24 213 lb 12.8 oz (97 kg)  07/07/23 206 lb (93.4 kg)  06/14/23 200 lb (90.7 kg)    GEN: Well nourished, well developed in no acute distress NECK: No JVD; No carotid bruits CARDIAC: ***RRR, no murmurs, rubs, gallops RESPIRATORY:  Clear to auscultation without rales, wheezing or rhonchi  ABDOMEN: Soft, non-tender, non-distended EXTREMITIES:  No edema; No deformity   ASSESSMENT AND PLAN ***    {Are you ordering a CV Procedure (e.g. stress test, cath, DCCV, TEE, etc)?   Press F2        :[id number]}  Dispo: ***  Signed, Rollo FABIENE Louder, PA-C

## 2024-09-13 ENCOUNTER — Other Ambulatory Visit: Payer: Self-pay | Admitting: Family Medicine

## 2024-09-17 ENCOUNTER — Ambulatory Visit: Admitting: Cardiology

## 2024-09-17 DIAGNOSIS — M5412 Radiculopathy, cervical region: Secondary | ICD-10-CM | POA: Diagnosis not present

## 2024-09-17 DIAGNOSIS — M5126 Other intervertebral disc displacement, lumbar region: Secondary | ICD-10-CM | POA: Diagnosis not present

## 2024-09-17 DIAGNOSIS — M47816 Spondylosis without myelopathy or radiculopathy, lumbar region: Secondary | ICD-10-CM | POA: Diagnosis not present

## 2024-09-17 DIAGNOSIS — M5136 Other intervertebral disc degeneration, lumbar region with discogenic back pain only: Secondary | ICD-10-CM | POA: Diagnosis not present

## 2024-09-18 ENCOUNTER — Encounter: Payer: Self-pay | Admitting: Physical Therapy

## 2024-09-18 ENCOUNTER — Ambulatory Visit (INDEPENDENT_AMBULATORY_CARE_PROVIDER_SITE_OTHER): Admitting: Physical Therapy

## 2024-09-18 DIAGNOSIS — M5459 Other low back pain: Secondary | ICD-10-CM

## 2024-09-18 DIAGNOSIS — R2689 Other abnormalities of gait and mobility: Secondary | ICD-10-CM | POA: Diagnosis not present

## 2024-09-18 DIAGNOSIS — M5417 Radiculopathy, lumbosacral region: Secondary | ICD-10-CM | POA: Diagnosis not present

## 2024-09-18 DIAGNOSIS — R29898 Other symptoms and signs involving the musculoskeletal system: Secondary | ICD-10-CM | POA: Diagnosis not present

## 2024-09-18 DIAGNOSIS — R293 Abnormal posture: Secondary | ICD-10-CM

## 2024-09-18 NOTE — Therapy (Signed)
 OUTPATIENT PHYSICAL THERAPY TREATMENT   Patient Name: Brandon Jackson MRN: [id number] DOB:17-Oct-1981, 43 y.o., male Today's Date: 09/18/2024  END OF SESSION:  PT End of Session - 09/18/24 0856     Visit Number 2    Number of Visits 6    Date for Recertification  10/16/24    Authorization Type [employer] 30% [employer]    Progress Note Due on Visit 10    PT Start Time 0855    PT Stop Time 0925    PT Time Calculation (min) 30 min    Activity Tolerance Patient tolerated treatment well    Behavior During Therapy [facility] for tasks assessed/performed           Past Medical History:  Diagnosis Date   Anxiety    Depression    Frequent headaches    Gout    History of chicken pox    Migraines    Ulcer    Past Surgical History:  Procedure Laterality Date   CYSTECTOMY  2003   TONSILLECTOMY AND ADENOIDECTOMY  2007   Wisdom Teeth Removal  1998   Patient Active Problem List   Diagnosis Date Noted   Back pain 04/02/2024   MDD (major depressive disorder), recurrent, in full remission 03/19/2024   Attention and concentration deficit 03/19/2024   Epididymitis 06/15/2023   Cold sore 10/13/2022   History of URI (upper respiratory infection) 10/13/2022   Gout 05/19/2022   Change in weight 05/19/2022   On pre-exposure prophylaxis for HIV 05/19/2022   MDD (major depressive disorder), recurrent episode, mild 03/03/2022   Insomnia due to other mental disorder 03/03/2022   Insomnia due to medical condition 12/23/2021   GAD (generalized anxiety disorder) 10/16/2021   Major depressive disorder, recurrent episode, moderate (HCC) 10/16/2021   Counseling on health promotion and disease prevention 10/14/2021   Routine general medical examination at a health care facility 12/14/2015   Advance care planning 12/14/2015   HLD (hyperlipidemia) 01/28/2015   Hyperglycemia 01/28/2015   Major depression 05/22/2014    PCP: Arlyss GORMAN Solian, MD   REFERRING PROVIDER: Arlyss GORMAN Solian, MD    REFERRING DIAG: M54.9 (ICD-10-CM) - Back pain, unspecified back location, unspecified back pain laterality, unspecified chronicity  Rationale for Evaluation and Treatment: Rehabilitation  THERAPY DIAG:  Other low back pain  Radiculopathy, lumbosacral region  Other abnormalities of gait and mobility  Other symptoms and signs involving the musculoskeletal system  Abnormal posture  ONSET DATE: February 2025, worsened again several times since then (April was worst)  SUBJECTIVE:  SUBJECTIVE STATEMENT: Back feels a little better; having some other medical concerns that he will be reaching out to PCP to discuss.  PERTINENT HISTORY:  Patient endorsing lower left lumbar pain with insidious onset following decrease in physical activity secondary to chronic epididymitis in late 2024. Patient has undergone pelvic floor PT in the past but noted that those exercises were causing an increase in pain. Patient also endorses that he believes what has worsened the pain is only sleeping in one comfortable position. Patient endorses no previous injuries, trauma, or surgeries to the area or surrounding joints. Patient has not received injections.  Patient wears a belt/brace to assist with long car rides.  See PMH or personal factors for in depth comorbidities   PAIN:  Are you having pain? Yes: NPRS scale: 1-2/10 at best, 3-4/10 max  Pain location: Lt lower back, intermittent sciatic pain down Lt LE (less frequent) Pain description: sharp, achy, intermittent nerve pain Aggravating factors: bending, lifting, twisting, activity  Relieving factors: heat, TENS  PRECAUTIONS: None  RED FLAGS: Bowel or bladder incontinence: No and Cauda equina syndrome: No   WEIGHT BEARING RESTRICTIONS: No  FALLS:  Has patient fallen  in last 6 months? No  LIVING ENVIRONMENT: Lives with: lives alone Lives in: House/apartment Stairs: No Has following equipment at home: None  OCCUPATION: marketing/communications (desk), photography   PLOF: Independent  PATIENT GOALS: be more comfortable moving, being more flexible   NEXT MD VISIT: not currently scheduled   OBJECTIVE:  Note: Objective measures were completed at Evaluation unless otherwise noted.  DIAGNOSTIC FINDINGS:  IMPRESSION: 1. Mild multilevel lumbar spondylosis without significant spinal stenosis or overt neural impingement. 2. Mild right L4 foraminal narrowing no other significant foraminal encroachment within the lumbar spine.  PATIENT SURVEYS:  PSFS: THE PATIENT SPECIFIC FUNCTIONAL SCALE  Place score of 0-10 (0 = unable to perform activity and 10 = able to perform activity at the same level as before injury or problem)  Activity Date: 09/04/2024    Bending  5    2. Lifting  7    3. Stretching  5    4. Sleeping  6    5. Sitting to standing 4    Total Score 5.4      Total Score = Sum of activity scores/number of activities  Minimally Detectable Change: 3 points (for single activity); 2 points (for average score)  [facility] (nd). The Patient Specific Functional Scale . Retrieved from [email or web address]   COGNITION: Overall cognitive status: Within functional limits for tasks assessed     SENSATION: Light touch: WFL  MUSCLE LENGTH: Not objectively measured, though appears tight with straight leg raise and patient endorsing difficulty with some positions   POSTURE: rounded shoulders, forward head, and increased thoracic kyphosis  PALPATION: Not TTP in lumbar musculature, bilat glutes, bilat SIJ or sacrum  LUMBAR ROM: painful when returning to starting position in most ROM  AROM Eval 09/04/2024  Flexion WFL  Extension [facility]  Right lateral flexion WFL  Left lateral  flexion WFL  Right rotation [facility]  Left rotation WFL   (Blank rows = not tested)  LOWER EXTREMITY ROM:     Passive  Right Eval 09/04/2024 Left Eval 09/04/2024  Hip flexion [facility]  Hip extension    Hip abduction    Hip adduction    Hip internal rotation    Hip external rotation    Knee flexion    Knee extension    Ankle dorsiflexion  Ankle plantarflexion    Ankle inversion    Ankle eversion     (Blank rows = not tested)  LOWER EXTREMITY MMT:    MMT Right Eval 09/04/2024 Left Eval 09/04/2024  Hip flexion (seated) 5/5 5/5  Hip extension (prone) 5/5 5/5  Hip abduction (sidelying) 5/5 5/5  Hip adduction    Hip internal rotation    Hip external rotation    Knee flexion (seated) 5/5 5/5  Knee extension (seated) 5/5 5/5  Ankle dorsiflexion (seated) 5/5 5/5  Ankle plantarflexion    Ankle inversion    Ankle eversion     (Blank rows = not tested)  LUMBAR SPECIAL TESTS:  Straight leg raise test: Negative, Slump test: Negative, FABER test: Negative, and FADIR negative   FUNCTIONAL TESTS:    GAIT: Distance walked: not objectively measured  Assistive device utilized: None Level of assistance: supervision Comments: no overt gait deviations noted   TREATMENT DATE:  09/18/24 NuStep L5 x 5 min Discussed current HEP and plan to help with exercise progression if needed; pt verbalized understanding  STM with compression to Lt glute med, QL and lumbar paraspinals; skilled palpation and monitoring of soft tissue during DN  Trigger Point Dry Needling  Initial Treatment: Pt instructed on Dry Needling rational, procedures, and possible side effects. Pt instructed to expect mild to moderate muscle soreness later in the day and/or into the next day.  Pt instructed in methods to reduce muscle soreness. Pt instructed to continue prescribed HEP. Patient was educated on signs and symptoms of infection and other risk factors and advised to seek medical attention should they  occur.  Patient verbalized understanding of these instructions and education.   Patient Verbal Consent Given: Yes Education Handout Provided: Yes Muscles Treated: Lt glute med Electrical Stimulation Performed: No Treatment Response/Outcome: twitch response; only able to tolerate single needle stick      09/04/2024 TherEx:  HEP handout provided with patient performing one set of each activity for appropriate form. Verbal and tactile cues provided.           PT discussed dry needling options.                                 Self-Care:  POC, importance of completing HEP with the combination of dry needling and in session activities                                                                                          PATIENT EDUCATION:  Education details: HEP, POC, dry needling  Person educated: Patient Education method: Explanation, Demonstration, Tactile cues, Verbal cues, and Handouts Education comprehension: verbalized understanding, returned demonstration, verbal cues required, and tactile cues required  HOME EXERCISE PROGRAM: Access Code: [email or web address] URL: [email or web address] Date: 09/04/2024 Prepared by: Susannah Daring  Exercises - Child's Pose Stretch  - 1 x daily - 7 x weekly - 3 sets - 30s hold - Child's Pose with Sidebending  - 1 x daily - 7 x weekly - 3 sets - 30s hold - Standing Quadratus Lumborum Stretch with Doorway  -  1 x daily - 7 x weekly - 3 sets - 30s hold - Supine Hamstring Stretch with Strap  - 1 x daily - 7 x weekly - 3 sets - 15-30s hold - Seated Figure 4 Piriformis Stretch  - 1 x daily - 7 x weekly - 3 sets - 30s hold - Standing Hip Flexor Stretch  - 1 x daily - 7 x weekly - 3 sets - 30s hold  ASSESSMENT:  CLINICAL IMPRESSION: Pt overall with significant reduction in LBP since eval; and agreeable to try dry needling today to see if that can help resolve pain.  Did only tolerate single needle stick, but good twitch response noted.   Anticipate pt may only need 1-2 more sessions at most to make sure he has well established HEP as he's doing much better.   OBJECTIVE IMPAIRMENTS: decreased activity tolerance, decreased coordination, decreased endurance, increased muscle spasms, impaired flexibility, improper body mechanics, postural dysfunction, and pain.   ACTIVITY LIMITATIONS: lifting, bending, sitting, standing, sleeping, bed mobility, and toileting  PARTICIPATION LIMITATIONS: cleaning, laundry, community activity, and occupation  PERSONAL FACTORS: Past/current experiences, Time since onset of injury/illness/exacerbation, and 3+ comorbidities: gout, depression, anxiety, migraines, chronic epididymitis, HLD are also affecting patient's functional outcome.   REHAB POTENTIAL: Good  CLINICAL DECISION MAKING: Stable/uncomplicated  EVALUATION COMPLEXITY: Low   GOALS: Goals reviewed with patient? Yes  SHORT TERM GOALS: Target date: 09/25/2024  Patient will show compliance with initial HEP.  Baseline: Goal status: INITIAL  2.  Patient will report pain levels no greater than 5/10 in order to show improved overall quality of life. Baseline:  Goal status: INITIAL    LONG TERM GOALS: Target date: 10/16/2024  Patient will be independent with final HEP in order to maintain and progress upon functional gains made within PT.  Baseline:  Goal status: INITIAL  2.  Patient will report pain levels no greater than 3/10 in order to show improved overall quality of life. Baseline:  Goal status: INITIAL  3.  Patient will increase PSFS to at least 7.4 in order to show a significant improvement in subjective disability rating.  Baseline:  Goal status: INITIAL  4.  Patient will report ability to perform household tasks, specifically laundry, with improved anxiety/fear levels with pain.  Baseline:  Goal status: INITIAL     PLAN:  PT FREQUENCY: 1x/week  PT DURATION: 6 weeks  PLANNED INTERVENTIONS: [id number]- PT  Re-evaluation, 97750- Physical Performance Testing, 97110-Therapeutic exercises, 97530- Therapeutic activity, [id number]- Neuromuscular re-education, 97535- Self Care, [id number]- Manual therapy, [id number]- Gait training, [phone]- Orthotic Initial, [phone]- Orthotic/Prosthetic subsequent, [phone]- Canalith repositioning, [phone]- Aquatic Therapy, [phone]- Electrical stimulation (unattended), [phone]- Electrical stimulation (manual), [id number]- Vasopneumatic device, [id number]- Ultrasound, [id number]- Traction (mechanical), [id number]- Ionotophoresis 4mg /ml Dexamethasone, 79439 (1-2 muscles), [id number] (3+ muscles)- Dry Needling, Patient/Family education, Balance training, Stair training, Taping, Joint mobilization, Joint manipulation, Spinal manipulation, Spinal mobilization, Compression bandaging, Vestibular training, DME instructions, Cryotherapy, and Moist heat.  PLAN FOR NEXT SESSION: check STGs, SIJ assessment, assess response to DN, general flexibility and strengthening for biomechanics purposes, postural strengthening    Corean JULIANNA Ku, PT, DPT 09/18/24 9:36 AM

## 2024-09-18 NOTE — Patient Instructions (Signed)

## 2024-09-19 ENCOUNTER — Other Ambulatory Visit: Payer: Self-pay | Admitting: Family Medicine

## 2024-09-20 NOTE — Telephone Encounter (Signed)
 Medication: Clonazepam  Directions:  TAKE 1/2 TO 1 TABLET BY MOUTH TWICE A DAY IF NEEDED FOR ANXIETY AND 1/2 TO 1 TABLET AT NIGHT IF NEEDED FOR INSOMNIA, CAUTION SEDATIO  Last given: 04/25/2024 Number refills: 1 Last o/v:  Follow up:  Labs:

## 2024-09-21 ENCOUNTER — Telehealth: Payer: Self-pay | Admitting: Physician Assistant

## 2024-09-21 ENCOUNTER — Encounter: Payer: Self-pay | Admitting: *Deleted

## 2024-09-21 NOTE — Telephone Encounter (Signed)
 Patient wants a call back to discuss billing codes for his upcoming visit.

## 2024-09-25 ENCOUNTER — Ambulatory Visit: Admitting: Psychology

## 2024-09-25 ENCOUNTER — Ambulatory Visit: Admitting: Physician Assistant

## 2024-09-25 ENCOUNTER — Encounter: Admitting: Rehabilitative and Restorative Service Providers"

## 2024-09-26 ENCOUNTER — Telehealth: Admitting: Psychiatry

## 2024-09-26 ENCOUNTER — Encounter: Payer: Self-pay | Admitting: Psychiatry

## 2024-09-26 DIAGNOSIS — F99 Mental disorder, not otherwise specified: Secondary | ICD-10-CM

## 2024-09-26 DIAGNOSIS — F3342 Major depressive disorder, recurrent, in full remission: Secondary | ICD-10-CM | POA: Diagnosis not present

## 2024-09-26 DIAGNOSIS — F5105 Insomnia due to other mental disorder: Secondary | ICD-10-CM

## 2024-09-26 DIAGNOSIS — F411 Generalized anxiety disorder: Secondary | ICD-10-CM | POA: Diagnosis not present

## 2024-09-26 NOTE — Progress Notes (Signed)
 Virtual Visit via Video Note  I connected with Brandon Jackson on 09/26/24 at 11:00 AM EST by a video enabled telemedicine application and verified that I am speaking with the correct person using two identifiers.  Location Provider Location : [facility] Patient Location : Work  Participants: Patient , Provider    I discussed the limitations of evaluation and management by telemedicine and the availability of in person appointments. The patient expressed understanding and agreed to proceed.    I discussed the assessment and treatment plan with the patient. The patient was provided an opportunity to ask questions and all were answered. The patient agreed with the plan and demonstrated an understanding of the instructions.   The patient was advised to call back or seek an in-person evaluation if the symptoms worsen or if the condition fails to improve as anticipated.  BH MD OP Progress Note  09/26/2024 11:29 AM Brandon Jackson  MRN:  [id number]  Chief Complaint:  Chief Complaint  Patient presents with   Follow-up   Depression   Anxiety   Medication Refill   Insomnia   Discussed the use of AI scribe software for clinical note transcription with the patient, who gave verbal consent to proceed.  History of Present Illness Brandon Jackson is a 43 year old Caucasian male, currently employed, lives in [place], has a history of MDD, GAD, hyperlipidemia, insomnia was evaluated by telemedicine today.  He reports improved sleep quality since starting his current medication regimen, including deeper sleep and the return of dreams, though he notes experiencing stress-related dreams. He typically falls asleep more easily, except for 1 recent night when he awoke at 2 a.m. and experienced racing thoughts until about 3:30 a.m. He currently takes doxepin  10 mg nightly for sleep and reports trying 20 mg once, which caused grogginess the next morning, so he has not repeated that dose. He has  not used hydroxyzine  recently since starting doxepin , but acknowledges it remains available as needed.  He reports continued improvement in anxiety. He continues Wellbutrin  SR 100 mg twice daily, Buspar  15 mg twice daily, and clonazepam  as needed. He feels apathetic about work, noting that the current busy season contributes to his stress and lack of motivation. He describes difficulty prioritizing tasks and a sense of indifference toward work outcomes, though he anticipates improvement when work quiets down in a few weeks.  He denies thoughts of hurting himself or others.     Visit Diagnosis:    ICD-10-CM   1. MDD (major depressive disorder), recurrent, in full remission  F33.42     2. GAD (generalized anxiety disorder)  F41.1     3. Insomnia due to other mental disorder  F51.05    F99    Anxiety, pain      Past Psychiatric History: I have reviewed past psychiatric history from progress note on 12/23/2021.  Past trials of Pristiq , duloxetine, Lexapro , Lunesta , Effexor , multiple other medications.  Past Medical History:  Past Medical History:  Diagnosis Date   Anxiety    Depression    Frequent headaches    Gout    History of chicken pox    Migraines    Ulcer     Past Surgical History:  Procedure Laterality Date   CYSTECTOMY  2003   TONSILLECTOMY AND ADENOIDECTOMY  2007   Wisdom Teeth Removal  1998    Family Psychiatric History: I have reviewed family psychiatric history from progress note on 12/23/2021.  Family History:  Family History  Problem Relation  Age of Onset   Depression Mother    Diabetes Father    Hyperlipidemia Father    Hypertension Father    Colon cancer Neg Hx    Prostate cancer Neg Hx     Social History: I have reviewed social history from progress note on 12/23/2021. Social History   Socioeconomic History   Marital status: Significant Other    Spouse name: Not on file   Number of children: Not on file   Years of education: College   Highest  education level: Bachelor's degree (e.g., BA, AB, BS)  Occupational History   Occupation: Photographer: [employer]  Tobacco Use   Smoking status: Former    Current packs/day: 0.00    Types: Cigarettes    Quit date: 2013    Years since quitting: 12.8    Passive exposure: Past   Smokeless tobacco: Never  Vaping Use   Vaping status: Never Used  Substance and Sexual Activity   Alcohol use: Yes    Comment: occasional   Drug use: No   Sexual activity: Not on file  Other Topics Concern   Not on file  Social History Narrative   Single   Was in committed relationship 2007-2023.   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     Social Drivers of Corporate Investment Banker Strain: Low Risk  (03/27/2024)   Overall Financial Resource Strain (CARDIA)    Difficulty of Paying Living Expenses: Not very hard  Food Insecurity: No Food Insecurity (03/27/2024)   Hunger Vital Sign    Worried About Running Out of Food in the Last Year: Never true    Ran Out of Food in the Last Year: Never true  Transportation Needs: No Transportation Needs (03/27/2024)   PRAPARE - Administrator, Civil Service (Medical): No    Lack of Transportation (Non-Medical): No  Physical Activity: Sufficiently Active (03/27/2024)   Exercise Vital Sign    Days of Exercise per Week: 3 days    Minutes of Exercise per Session: 50 min  Stress: No Stress Concern Present (03/27/2024)   [employer] of Occupational Health - Occupational Stress Questionnaire    Feeling of Stress : Only a little  Social Connections: Socially Isolated (03/27/2024)   Social Connection and Isolation Panel    Frequency of Communication with Friends and Family: Twice a week    Frequency of Social Gatherings with Friends and Family: Twice a week    Attends Religious Services: Never    Database Administrator or Organizations: No    Attends Engineer, Structural: Not on file     Marital Status: Divorced    Allergies:  Allergies  Allergen Reactions   Wellbutrin  [Bupropion ] Other (See Comments)    Anxiety increased on med when taken alone.  He can tolerate wellbutrin  when taken with lexparo.    Erythromycin Diarrhea and Rash    Metabolic Disorder Labs: No results found for: HGBA1C, MPG No results found for: PROLACTIN Lab Results  Component Value Date   CHOL 154 05/17/2022   TRIG 111.0 05/17/2022   HDL 49.60 05/17/2022   CHOLHDL 3 05/17/2022   VLDL 22.2 05/17/2022   LDLCALC 82 05/17/2022   LDLCALC 82 10/12/2021   Lab Results  Component Value Date   TSH 1.24 05/17/2022   TSH 1.310 01/05/2022    Therapeutic Level Labs: No results found for: LITHIUM No results found  for: VALPROATE No results found for: CBMZ  Current Medications: Current Outpatient Medications  Medication Sig Dispense Refill   allopurinol  (ZYLOPRIM ) 100 MG tablet Take 1 tablet by mouth twice daily. 180 tablet 2   Ascorbic Acid (VITAMIN C PO) Take by mouth.     B Complex Vitamins (VITAMIN B COMPLEX PO) Take by mouth.     buPROPion  ER (WELLBUTRIN  SR) 100 MG 12 hr tablet Take 1 tablet (100 mg total) by mouth 2 (two) times daily. 180 tablet 3   busPIRone  (BUSPAR ) 15 MG tablet Take 1 tablet (15 mg total) by mouth 2 (two) times daily. Dose change , 60 tablet 1   cetirizine  (ZYRTEC ) 10 MG tablet Take 1 tablet (10 mg total) by mouth daily.     clonazePAM  (KLONOPIN ) 0.5 MG tablet TAKE 1/2 TO 1 TABLET BY MOUTH TWICE A DAY IF NEEDED FOR ANXIETY AND 1/2 TO 1 TABLET AT NIGHT IF NEEDED FOR INSOMNIA, CAUTION SEDATION. 20 tablet 1   Cranberry 500 MG CAPS Take by mouth.     doxepin  (SINEQUAN ) 10 MG capsule Take 1-2 capsules (10-20 mg total) by mouth at bedtime as needed. 60 capsule 1   emtricitabine -tenofovir  (TRUVADA) 200-300 MG tablet Take 1 tablet by mouth daily.  Please schedule your yearly visit for summer 2025. 90 tablet 0   fluticasone  (FLONASE ) 50 MCG/ACT nasal spray Place 1  spray into both nostrils as needed.     hydrOXYzine  (VISTARIL ) 25 MG capsule Take 1 to 3 capsules by mouth at bedtime as needed for anxiety. 270 capsule 0   levocetirizine (XYZAL  ALLERGY 24HR) 5 MG tablet Take 1 tablet (5 mg total) by mouth every evening for 5 days. 5 tablet 0   Lysine 1000 MG TABS      meloxicam  (MOBIC ) 15 MG tablet Take 1 tablet (15 mg total) by mouth as needed.     Multiple Vitamin (MULTIVITAMIN) tablet Take 1 tablet by mouth daily.     Omega-3 Fatty Acids (FISH OIL) 300 MG CAPS Take 300 mg by mouth daily.     tadalafil (CIALIS) 5 MG tablet Take 5 mg by mouth daily.     valACYclovir  (VALTREX ) 500 MG tablet TAKE 1 TABLET (500 MG TOTAL) BY MOUTH DAILY. 90 tablet 1   No current facility-administered medications for this visit.     Musculoskeletal: Strength & Muscle Tone: UTA Gait & Station: Seated Patient leans: N/A  Psychiatric Specialty Exam: Review of Systems  Psychiatric/Behavioral:  The patient is nervous/anxious.     There were no vitals taken for this visit.There is no height or weight on file to calculate BMI.  General Appearance: Casual  Eye Contact:  Fair  Speech:  Clear and Coherent  Volume:  Normal  Mood:  Anxious coping well  Affect:  Appropriate  Thought Process:  Goal Directed and Descriptions of Associations: Intact  Orientation:  Full (Time, Place, and Person)  Thought Content: Logical   Suicidal Thoughts:  No  Homicidal Thoughts:  No  Memory:  Immediate;   Fair Recent;   Fair Remote;   Fair  Judgement:  Fair  Insight:  Fair  Psychomotor Activity:  Normal  Concentration:  Concentration: Fair and Attention Span: Fair  Recall:  [employer] of Knowledge: Fair  Language: Fair  Akathisia:  No  Handed:  Right  AIMS (if indicated): not done  Assets:  Communication Skills Desire for Improvement Housing Social Support Talents/Skills Transportation  ADL's:  Intact  Cognition: WNL  Sleep:  Improving  Screenings: AIMS    Flowsheet Row  Video Visit from 03/03/2022 in [facility]  AIMS Total Score 0   AUDIT    Flowsheet Row Office Visit from 03/29/2024 in [facility]  Alcohol Use Disorder Identification Test Final Score (AUDIT) 4    GAD-7    Flowsheet Row Office Visit from 09/10/2024 in [facility] Office Visit from 03/29/2024 in [facility] [place] Office Visit from 09/26/2023 in [facility] Office Visit from 07/07/2023 in [facility] Office Visit from 06/14/2023 in [facility]  Total GAD-7 Score 7 7 7 14 6    PHQ2-9    Flowsheet Row Office Visit from 09/10/2024 in [facility] Office Visit from 03/29/2024 in [facility] Office Visit from 09/26/2023 in [facility] Office Visit from 07/07/2023 in [facility] Office Visit from 06/14/2023 in [facility]  PHQ-2 Total Score 0 1 0 2 2  PHQ-9 Total Score -- 10 -- 9 8   Flowsheet Row Video Visit from 09/26/2024 in [facility] Office Visit from 09/10/2024 in [facility] Brandon from 09/05/2024 in [facility] at [facility]  C-SSRS RISK CATEGORY No Risk No Risk No Risk     Assessment and Plan: Brandon Jackson is a 43 year old Caucasian male who presented for a follow-up appointment, discussed assessment and plan as noted below.  1. MDD (major depressive disorder), recurrent, in full remission Currently reports overall mood symptoms are stable Continue Wellbutrin  SR 100 mg twice daily Continue CBT with Mr. Edwin Mullet   2. GAD (generalized anxiety  disorder)-improving Currently reports work-related anxiety although coping well. Continue BuSpar  15 mg twice daily Continue Hydroxyzine  25 to 75 mg at bedtime as needed Continue Clonazepam  0.5 mg as needed for racing thoughts.  Uses it rarely.  3. Insomnia due to other mental disorder-improving Sleep has improved on doxepin . Continue Doxepin  10 to 20 mg at bedtime Continue sleep hygiene techniques Consider referral for sleep study in the future  Follow-up Follow-up in clinic in 2 months or sooner if needed.     Collaboration of Care: Collaboration of Care: Referral or follow-up with counselor/therapist AEB encouraged to continue psychotherapy sessions.  Patient/Guardian was advised Release of Information must be obtained prior to any record release in order to collaborate their care with an outside provider. Patient/Guardian was advised if they have not already done so to contact the registration department to sign all necessary forms in order for us  to release information regarding their care.   Consent: Patient/Guardian gives verbal consent for treatment and assignment of benefits for services provided during this visit. Patient/Guardian expressed understanding and agreed to proceed.   This note was generated in part or whole with voice recognition software. Voice recognition is usually quite accurate but there are transcription errors that can and very often do occur. I apologize for any typographical errors that were not detected and corrected.    Lakenzie Mcclafferty, MD 09/28/2024, 7:52 AM

## 2024-10-02 ENCOUNTER — Ambulatory Visit: Admitting: Family Medicine

## 2024-10-02 ENCOUNTER — Ambulatory Visit (INDEPENDENT_AMBULATORY_CARE_PROVIDER_SITE_OTHER): Admitting: Rehabilitative and Restorative Service Providers"

## 2024-10-02 ENCOUNTER — Encounter: Payer: Self-pay | Admitting: Rehabilitative and Restorative Service Providers"

## 2024-10-02 ENCOUNTER — Other Ambulatory Visit: Payer: Self-pay | Admitting: Psychiatry

## 2024-10-02 ENCOUNTER — Encounter: Payer: Self-pay | Admitting: Family Medicine

## 2024-10-02 VITALS — BP 126/82 | HR 64 | Temp 98.0°F | Ht 67.5 in | Wt 214.2 lb

## 2024-10-02 DIAGNOSIS — R29898 Other symptoms and signs involving the musculoskeletal system: Secondary | ICD-10-CM | POA: Diagnosis not present

## 2024-10-02 DIAGNOSIS — M5459 Other low back pain: Secondary | ICD-10-CM | POA: Diagnosis not present

## 2024-10-02 DIAGNOSIS — R2689 Other abnormalities of gait and mobility: Secondary | ICD-10-CM | POA: Diagnosis not present

## 2024-10-02 DIAGNOSIS — F411 Generalized anxiety disorder: Secondary | ICD-10-CM

## 2024-10-02 DIAGNOSIS — K219 Gastro-esophageal reflux disease without esophagitis: Secondary | ICD-10-CM | POA: Diagnosis not present

## 2024-10-02 DIAGNOSIS — M5417 Radiculopathy, lumbosacral region: Secondary | ICD-10-CM | POA: Diagnosis not present

## 2024-10-02 DIAGNOSIS — F5105 Insomnia due to other mental disorder: Secondary | ICD-10-CM

## 2024-10-02 DIAGNOSIS — R293 Abnormal posture: Secondary | ICD-10-CM

## 2024-10-02 DIAGNOSIS — F3342 Major depressive disorder, recurrent, in full remission: Secondary | ICD-10-CM

## 2024-10-02 MED ORDER — EMTRICITABINE-TENOFOVIR DF 200-300 MG PO TABS: 1.0000 | ORAL_TABLET | Freq: Every day | ORAL | Status: DC

## 2024-10-02 MED ORDER — OMEPRAZOLE 20 MG PO CPDR
20.0000 mg | DELAYED_RELEASE_CAPSULE | Freq: Every day | ORAL | Status: AC
Start: 2024-10-02 — End: ?

## 2024-10-02 MED ORDER — FAMOTIDINE 20 MG PO TABS: 20.0000 mg | ORAL_TABLET | Freq: Every day | ORAL | Status: AC | PRN

## 2024-10-02 NOTE — Therapy (Signed)
 OUTPATIENT PHYSICAL THERAPY TREATMENT   Patient Name: Brandon Jackson MRN: [id number] DOB:1981-04-01, 43 y.o., male Today's Date: 10/02/2024  END OF SESSION:  PT End of Session - 10/02/24 0854     Visit Number 3    Number of Visits 6    Date for Recertification  10/16/24    Authorization Type [employer] 30% [employer]ANCE    Progress Note Due on Visit 10    PT Start Time 0849    PT Stop Time 0927    PT Time Calculation (min) 38 min    Activity Tolerance Patient tolerated treatment well    Behavior During Therapy [facility] for tasks assessed/performed            Past Medical History:  Diagnosis Date   Anxiety    Depression    Frequent headaches    Gout    History of chicken pox    Migraines    Ulcer    Past Surgical History:  Procedure Laterality Date   CYSTECTOMY  2003   TONSILLECTOMY AND ADENOIDECTOMY  2007   Wisdom Teeth Removal  1998   Patient Active Problem List   Diagnosis Date Noted   Back pain 04/02/2024   MDD (major depressive disorder), recurrent, in full remission 03/19/2024   Attention and concentration deficit 03/19/2024   Epididymitis 06/15/2023   Cold sore 10/13/2022   History of URI (upper respiratory infection) 10/13/2022   Gout 05/19/2022   Change in weight 05/19/2022   On pre-exposure prophylaxis for HIV 05/19/2022   MDD (major depressive disorder), recurrent episode, mild 03/03/2022   Insomnia due to other mental disorder 03/03/2022   Insomnia due to medical condition 12/23/2021   GAD (generalized anxiety disorder) 10/16/2021   Major depressive disorder, recurrent episode, moderate (HCC) 10/16/2021   Counseling on health promotion and disease prevention 10/14/2021   Routine general medical examination at a health care facility 12/14/2015   Advance care planning 12/14/2015   HLD (hyperlipidemia) 01/28/2015   Hyperglycemia 01/28/2015   Major depression 05/22/2014    PCP: Arlyss GORMAN Solian, MD   REFERRING PROVIDER: Arlyss GORMAN Solian, MD    REFERRING DIAG: M54.9 (ICD-10-CM) - Back pain, unspecified back location, unspecified back pain laterality, unspecified chronicity  Rationale for Evaluation and Treatment: Rehabilitation  THERAPY DIAG:  Other low back pain  Radiculopathy, lumbosacral region  Other abnormalities of gait and mobility  Other symptoms and signs involving the musculoskeletal system  Abnormal posture  ONSET DATE: February 2025, worsened again several times since then (April was worst)  SUBJECTIVE:  SUBJECTIVE STATEMENT: Pt indicated feeling better from last visit.  Reported a twinge or two.  Reported noted with getting up from sitting.   PERTINENT HISTORY:  Patient endorsing lower left lumbar pain with insidious onset following decrease in physical activity secondary to chronic epididymitis in late 2024. Patient has undergone pelvic floor PT in the past but noted that those exercises were causing an increase in pain. Patient also endorses that he believes what has worsened the pain is only sleeping in one comfortable position. Patient endorses no previous injuries, trauma, or surgeries to the area or surrounding joints. Patient has not received injections.  Patient wears a belt/brace to assist with long car rides.  See PMH or personal factors for in depth comorbidities   PAIN:  NPRS scale: at worst since last visit 3/10.  Pain location: Lt lower back, intermittent sciatic pain down Lt LE (less frequent) Pain description: sharp, achy, intermittent nerve pain Aggravating factors: bending, lifting, twisting, activity  Relieving factors: heat, TENS  PRECAUTIONS: None  RED FLAGS: Bowel or bladder incontinence: No and Cauda equina syndrome: No   WEIGHT BEARING RESTRICTIONS: No  FALLS:  Has patient fallen in last 6  months? No  LIVING ENVIRONMENT: Lives with: lives alone Lives in: House/apartment Stairs: No Has following equipment at home: None  OCCUPATION: marketing/communications (desk), photography   PLOF: Independent  PATIENT GOALS: be more comfortable moving, being more flexible   NEXT MD VISIT: not currently scheduled   OBJECTIVE:  Note: Objective measures were completed at Evaluation unless otherwise noted.  DIAGNOSTIC FINDINGS:  IMPRESSION: 1. Mild multilevel lumbar spondylosis without significant spinal stenosis or overt neural impingement. 2. Mild right L4 foraminal narrowing no other significant foraminal encroachment within the lumbar spine.  PATIENT SURVEYS:  PSFS: THE PATIENT SPECIFIC FUNCTIONAL SCALE  Place score of 0-10 (0 = unable to perform activity and 10 = able to perform activity at the same level as before injury or problem)  Activity Date: 09/04/2024    Bending  5    2. Lifting  7    3. Stretching  5    4. Sleeping  6    5. Sitting to standing 4    Total Score 5.4      Total Score = Sum of activity scores/number of activities  Minimally Detectable Change: 3 points (for single activity); 2 points (for average score)  [facility] (nd). The Patient Specific Functional Scale . Retrieved from [email or web address]   COGNITION: 09/04/2024 Overall cognitive status: Within functional limits for tasks assessed     SENSATION: 09/04/2024 Light touch: WFL  MUSCLE LENGTH: 09/04/2024 Not objectively measured, though appears tight with straight leg raise and patient endorsing difficulty with some positions   POSTURE: 09/04/2024  rounded shoulders, forward head, and increased thoracic kyphosis  PALPATION: 09/04/2024 Not TTP in lumbar musculature, bilat glutes, bilat SIJ or sacrum  LUMBAR ROM: painful when returning to starting position in most ROM  AROM Eval 09/04/2024 10/02/2024  Flexion  WFL To toes no complaints  Extension WFL 100 % WFL no complaints.   Right lateral flexion WFL   Left lateral flexion WFL   Right rotation [facility]   Left rotation WFL    (Blank rows = not tested)  LOWER EXTREMITY ROM:     Passive  Right Eval 09/04/2024 Left Eval 09/04/2024  Hip flexion [facility]  Hip extension    Hip abduction    Hip adduction    Hip internal rotation    Hip external  rotation    Knee flexion    Knee extension    Ankle dorsiflexion    Ankle plantarflexion    Ankle inversion    Ankle eversion     (Blank rows = not tested)  LOWER EXTREMITY MMT:    MMT Right Eval 09/04/2024 Left Eval 09/04/2024  Hip flexion (seated) 5/5 5/5  Hip extension (prone) 5/5 5/5  Hip abduction (sidelying) 5/5 5/5  Hip adduction    Hip internal rotation    Hip external rotation    Knee flexion (seated) 5/5 5/5  Knee extension (seated) 5/5 5/5  Ankle dorsiflexion (seated) 5/5 5/5  Ankle plantarflexion    Ankle inversion    Ankle eversion     (Blank rows = not tested)  LUMBAR SPECIAL TESTS:  09/04/2024 Straight leg raise test: Negative, Slump test: Negative, FABER test: Negative, and FADIR negative   FUNCTIONAL TESTS:  09/04/2024 Not tested   GAIT: 09/04/2024 Distance walked: not objectively measured  Assistive device utilized: None Level of assistance: supervision Comments: no overt gait deviations noted                 TREATMENT          DATE:  10/02/2024 Therex: Standing lumbar extension AROM x 5 Supine hooklying figure 4 pull towards 30 sec x 3 bilateral Supine bridge 2 x 10 2-3 sec hold.  Discussed progression to figure 4 single leg bridge Prone opposite arm/leg lift 3 sec hold x 10 bilaterally Standing doorway hip hike 3 sec hold x 10 bilaterally   Discussed prone plank holds and seated UE press into table for core activation, written on HEP printout.   Time spent in review of HEP updates.   Manual Percussive device to Lt QL, Lt glute max,  min/med    TREATMENT          DATE: 09/18/24 NuStep L5 x 5 min Discussed current HEP and plan to help with exercise progression if needed; pt verbalized understanding  STM with compression to Lt glute med, QL and lumbar paraspinals; skilled palpation and monitoring of soft tissue during DN  Trigger Point Dry Needling  Initial Treatment: Pt instructed on Dry Needling rational, procedures, and possible side effects. Pt instructed to expect mild to moderate muscle soreness later in the day and/or into the next day.  Pt instructed in methods to reduce muscle soreness. Pt instructed to continue prescribed HEP. Patient was educated on signs and symptoms of infection and other risk factors and advised to seek medical attention should they occur.  Patient verbalized understanding of these instructions and education.   Patient Verbal Consent Given: Yes Education Handout Provided: Yes Muscles Treated: Lt glute med Electrical Stimulation Performed: No Treatment Response/Outcome: twitch response; only able to tolerate single needle stick    TREATMENT          DATE: 09/04/2024 TherEx:  HEP handout provided with patient performing one set of each activity for appropriate form. Verbal and tactile cues provided.           PT discussed dry needling options.                                 Self-Care:  POC, importance of completing HEP with the combination of dry needling and in session activities  PATIENT EDUCATION:  10/02/2024 Education details: HEP update Person educated: Patient Education method: Explanation, Demonstration, Tactile cues, Verbal cues, and Handouts Education comprehension: verbalized understanding, returned demonstration, and verbal cues required  HOME EXERCISE PROGRAM: Access Code: [email or web address] URL: [email or web address] Date: 10/02/2024 Prepared by: Ozell Silvan  Exercises - Child's Pose Stretch  - 1 x daily - 7 x weekly - 3 sets - 30s hold - Child's Pose with Sidebending  - 1 x daily - 7 x weekly - 3 sets - 30s hold - Standing Quadratus Lumborum Stretch with Doorway  - 1 x daily - 7 x weekly - 3 sets - 30s hold - Supine Hamstring Stretch with Strap  - 1 x daily - 7 x weekly - 3 sets - 15-30s hold - Seated Figure 4 Piriformis Stretch  - 1 x daily - 7 x weekly - 3 sets - 30s hold - Standing Hip Flexor Stretch  - 1 x daily - 7 x weekly - 3 sets - 30s hold - Standing Lumbar Extension  - 2-4 x daily - 7 x weekly - 1 sets - 5-10 reps - Supine Bridge  - 1 x daily - 7 x weekly - 1-2 sets - 10 reps - 2 hold - Standing Hip Hiking  - 1 x daily - 7 x weekly - 1 sets - 10 reps - 5 hold - Prone Alternating Arm and Leg Lifts  - 1 x daily - 7 x weekly - 1-2 sets - 10 reps - 3 hold  ASSESSMENT:  CLINICAL IMPRESSION: Positiive improvement from needling.  Due to improvement level, elected to perform percussive device for treatment and education for use at home.  Good progression into strength activation, with added HEP.  Will return in a few weeks for recheck of presentation.   OBJECTIVE IMPAIRMENTS: decreased activity tolerance, decreased coordination, decreased endurance, increased muscle spasms, impaired flexibility, improper body mechanics, postural dysfunction, and pain.   ACTIVITY LIMITATIONS: lifting, bending, sitting, standing, sleeping, bed mobility, and toileting  PARTICIPATION LIMITATIONS: cleaning, laundry, community activity, and occupation  PERSONAL FACTORS: Past/current experiences, Time since onset of injury/illness/exacerbation, and 3+ comorbidities: gout, depression, anxiety, migraines, chronic epididymitis, HLD are also affecting patient's functional outcome.   REHAB POTENTIAL: Good  CLINICAL DECISION MAKING: Stable/uncomplicated  EVALUATION COMPLEXITY: Low   GOALS: Goals reviewed with patient? Yes  SHORT TERM GOALS: Target date:  09/25/2024  Patient will show compliance with initial HEP.  Baseline: Goal status: Met 10/02/2024  2.  Patient will report pain levels no greater than 5/10 in order to show improved overall quality of life. Baseline:  Goal status: Met 10/02/2024    LONG TERM GOALS: Target date: 10/16/2024  Patient will be independent with final HEP in order to maintain and progress upon functional gains made within PT.  Baseline:  Goal status: on going 10/02/2024  2.  Patient will report pain levels no greater than 3/10 in order to show improved overall quality of life. Baseline:  Goal status: on going 10/02/2024  3.  Patient will increase PSFS to at least 7.4 in order to show a significant improvement in subjective disability rating.  Baseline:  Goal status: on going 10/02/2024  4.  Patient will report ability to perform household tasks, specifically laundry, with improved anxiety/fear levels with pain.  Baseline:  Goal status: on going 10/02/2024     PLAN:  PT FREQUENCY: 1x/week  PT DURATION: 6 weeks  PLANNED INTERVENTIONS: 97164- PT Re-evaluation, 97750- Physical Performance Testing, 97110-Therapeutic exercises, 97530- Therapeutic activity, 97112-  Neuromuscular re-education, [id number]- Self Care, 02859- Manual therapy, [id number]- Gait training, [id number]- Orthotic Initial, [id number]- Orthotic/Prosthetic subsequent, [phone]- Canalith repositioning, [id number]- Aquatic Therapy, [phone]- Electrical stimulation (unattended), [phone]- Electrical stimulation (manual), [id number]- Vasopneumatic device, [id number]- Ultrasound, [id number]- Traction (mechanical), [id number]- Ionotophoresis 4mg /ml Dexamethasone, 20560 (1-2 muscles), 20561 (3+ muscles)- Dry Needling, Patient/Family education, Balance training, Stair training, Taping, Joint mobilization, Joint manipulation, Spinal manipulation, Spinal mobilization, Compression bandaging, Vestibular training, DME instructions, Cryotherapy, and Moist heat.  PLAN FOR NEXT SESSION: Recheck presentation,  possible discharge based off symptoms.   Ozell Silvan, PT, DPT, OCS, ATC 10/02/24  9:27 AM

## 2024-10-02 NOTE — Patient Instructions (Addendum)
 You could try taking pepcid  if you have more symptoms.  We can set up cardiac CT screening if needed.  Take care.  Glad to see you.

## 2024-10-02 NOTE — Progress Notes (Unsigned)
 PT helped his back.   Prev MRI d/w pt.  IMPRESSION: 1. Mild multilevel lumbar spondylosis without significant spinal stenosis or overt neural impingement. 2. Mild right L4 foraminal narrowing no other significant foraminal encroachment within the lumbar spine.  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    D/w pt about ER eval.  He had pain, central chest, night prior to ER eval.  Was at rest.  He tried resting, was getting worse.  He tried to eat some, then pain gradually got better.  Then the pain got worse again.  Then to work next AM with more pain and then went to ER.    MGF and maternal uncle have h/o CAD but not at early age.    No exertional pain.    The night after the ER eval he had frequent belching and improved.    D/w pt about cardiology eval- he had to cancel prev office visit.    He had been taking omeprazole  20mg  a day.   Meds, vitals, and allergies reviewed.   ROS: Per HPI unless specifically indicated in ROS section

## 2024-10-04 ENCOUNTER — Encounter: Payer: Self-pay | Admitting: Family Medicine

## 2024-10-04 DIAGNOSIS — K219 Gastro-esophageal reflux disease without esophagitis: Secondary | ICD-10-CM | POA: Insufficient documentation

## 2024-10-04 NOTE — Assessment & Plan Note (Signed)
 He improved taking omeprazole .  He does not have exertional symptoms.  I suspect this was GERD related.  Discussed anatomy and options.  Continue omeprazole  for now.  He could take Pepcid  if needed.  He may be able to wean down from omeprazole  to taking Pepcid  if needed.  If he has any other symptoms we could get additional cardiac testing done with cardiac CT but I think it would be okay to defer this for now. Rationale discussed with patient and he agrees to plan.

## 2024-10-09 ENCOUNTER — Encounter: Admitting: Physical Therapy

## 2024-10-09 ENCOUNTER — Other Ambulatory Visit: Payer: Self-pay | Admitting: Family Medicine

## 2024-10-09 MED ORDER — PREDNISONE 10 MG PO TABS: ORAL_TABLET | ORAL | 0 refills | Status: AC

## 2024-10-10 ENCOUNTER — Ambulatory Visit: Admitting: Psychology

## 2024-10-10 DIAGNOSIS — F411 Generalized anxiety disorder: Secondary | ICD-10-CM

## 2024-10-10 DIAGNOSIS — M47816 Spondylosis without myelopathy or radiculopathy, lumbar region: Secondary | ICD-10-CM | POA: Diagnosis not present

## 2024-10-10 DIAGNOSIS — M5412 Radiculopathy, cervical region: Secondary | ICD-10-CM | POA: Diagnosis not present

## 2024-10-10 DIAGNOSIS — M5126 Other intervertebral disc displacement, lumbar region: Secondary | ICD-10-CM | POA: Diagnosis not present

## 2024-10-10 DIAGNOSIS — M5136 Other intervertebral disc degeneration, lumbar region with discogenic back pain only: Secondary | ICD-10-CM | POA: Diagnosis not present

## 2024-10-10 DIAGNOSIS — F3342 Major depressive disorder, recurrent, in full remission: Secondary | ICD-10-CM | POA: Diagnosis not present

## 2024-10-10 NOTE — Progress Notes (Signed)
 [facility] Counselor/Therapist Progress Note  Patient ID: Brandon Jackson, MRN: [id number]   Date: 10/10/24  Time Spent: 3:05 pm - 3:49 pm : 44 Minutes  Treatment Type: Individual Therapy.  Reported Symptoms: Depression and anxiety.   Mental Status Exam: Appearance:  Neat and Well Groomed     Behavior: Appropriate  Motor: Normal  Speech/Language:  Clear and Coherent  Affect: Appropriate  Mood: anxious  Thought process: normal  Thought content:   WNL  Sensory/Perceptual disturbances:   WNL  Orientation: oriented to person, place, time/date, and situation  Attention: Good  Concentration: Good  Memory: WNL  Fund of knowledge:  Good  Insight:   Good  Judgment:  Good  Impulse Control: Good   Risk Assessment: Danger to Self:  No Self-injurious Behavior: No Danger to Others: No Duty to Warn:no Physical Aggression / Violence:No  Access to Firearms a concern: No  Gang Involvement:No   Subjective:   Brandon Jackson participated from office, via video and consented to treatment. Therapist participated from home office. Brandon Jackson reviewed the events of the past week. Brandon Jackson noted recently meeting with his psychiatric provider and noted being prescribed a sleep aid which he noted has been helpful. Brandon Jackson noted his anxiety rising due to a work project and noted his effort to manage this which includes delaying a trip to a more opportune time. He noted his current effort to locate a home, with his partner, and noted his intent to move out of his apartment regardless of his house purchasing goals. We worked on exploring his plan in relation to previous concerns regarding drastic differences in political leanings. He noted some progress in this area but noted a need for larger discussion and noted both excitement and anxiety regarding this transition. We worked on identifying his concerns and possible topics of conversation prior to moving in together. He noted  wondering if its going to work. We worked on exploring his concerns during the session. Therapist encouraged Brandon Jackson to identify online resources regarding topics of discussion prior to moving in with a partner. Brandon Jackson was receptive to this during the session. We reviewed the importance of weekly check-ins to identify stressors, communicate needs, make adjustments, and resolve possible stressors. Brandon Jackson noted work related stressors regarding wholly unreasonable expectations. We worked on processing this during the session and Brandon Jackson worked on identifying ways to manage this stress and therapist highlighted Brandon Jackson's approach and self-talk which is more positive and adaptive than in the past. Therapist praised Brandon Jackson for his effort during the session and between sessions. Therapist validated Brandon Jackson's feelings and experience, during the session, and provided supportive therapy. A follow-up was scheduled for continued treatment, which Brandon Jackson benefits from.   Interventions: CBT & interpersonal  Diagnosis:  MDD (major depressive disorder), recurrent, in full remission  GAD (generalized anxiety disorder)  Psychiatric Treatment: Yes , VIA PCP.    Treatment Plan:  Client Abilities/Strengths Brandon Jackson is intelligent, self-aware, and motivated for change.   Support System: Family and friends.   Client Treatment Preferences OPT.   Client Statement of Needs Brandon Jackson would like to increase assertiveness, advocating for self, engaging in mindfulness, developing home and work-life balance, addressing perfectionism, improving self-talk and identifying positives about self. Additional clinical goals include symptom management, relaxation, self-care, processing events, and bolstering coping skills.   Treatment Level Weekly  Symptoms  Anxiety: Feeling anxiety, difficulty stopping worry, worrying about different things, trouble relaxing, irritability, and feeling afraid something awful might  happen.   (Status: improved) Depression:  Loss of interest, fluctuating sleep, fluctuating appetite, feeling bad about self, difficulty concentrating.    (Status: maintained)  Goals:   Brandon Jackson experiences symptoms of of depression and anxiety.   Treatment plan signed and available on s-drive:   No, pending signature via MyChart.  Brandon Jackson was sent the treatment plan signature form on 04/24/24.   Target Date: 04/23/25 Frequency: Weekly  Progress: 0 Modality: individual    Therapist will provide referrals for additional resources as appropriate.  Therapist will provide psycho-education regarding Brandon Jackson's diagnosis and corresponding treatment approaches and interventions. Brandon Mullet, LCSW will support the patient's ability to achieve the goals identified. will employ CBT, BA, Problem-solving, Solution Focused, Mindfulness,  coping skills, & other evidenced-based practices will be used to promote progress towards healthy functioning to help manage decrease symptoms associated with his diagnosis.   Reduce overall level, frequency, and intensity of the feelings of depression, anxiety and panic evidenced by decreased overall symptoms from 6 to 7 days/week to 0 to 1 days/week per client report for at least 3 consecutive months. Verbally express understanding of the relationship between feelings of depression, anxiety and their impact on thinking patterns and behaviors. Verbalize an understanding of the role that distorted thinking plays in creating fears, excessive worry, and ruminations.    Tatiana participated in the creation of the treatment plan)   Brandon Mullet, LCSW

## 2024-10-23 ENCOUNTER — Encounter: Payer: Self-pay | Admitting: Physical Therapy

## 2024-10-23 ENCOUNTER — Ambulatory Visit: Admitting: Physical Therapy

## 2024-10-23 DIAGNOSIS — R2689 Other abnormalities of gait and mobility: Secondary | ICD-10-CM

## 2024-10-23 DIAGNOSIS — M5417 Radiculopathy, lumbosacral region: Secondary | ICD-10-CM

## 2024-10-23 DIAGNOSIS — R293 Abnormal posture: Secondary | ICD-10-CM

## 2024-10-23 DIAGNOSIS — M5459 Other low back pain: Secondary | ICD-10-CM

## 2024-10-23 DIAGNOSIS — R29898 Other symptoms and signs involving the musculoskeletal system: Secondary | ICD-10-CM

## 2024-10-23 NOTE — Therapy (Addendum)
 " OUTPATIENT PHYSICAL THERAPY TREATMENT RECERTIFICATION  / DISCHARGE   Patient Name: Brandon Jackson MRN: [id number] DOB:Jun 10, 1981, 43 y.o., male Today's Date: 10/23/2024  END OF SESSION:  PT End of Session - 10/23/24 0855     Visit Number 4    Number of Visits 8    Date for Recertification  11/20/24    Authorization Type [employer] 30% COINSURANCE    Progress Note Due on Visit 10    PT Start Time 0853    PT Stop Time 0925    PT Time Calculation (min) 32 min    Activity Tolerance Patient tolerated treatment well    Behavior During Therapy [facility] for tasks assessed/performed             Past Medical History:  Diagnosis Date   Anxiety    Depression    Frequent headaches    Gout    History of chicken pox    Migraines    Ulcer    Past Surgical History:  Procedure Laterality Date   CYSTECTOMY  2003   TONSILLECTOMY AND ADENOIDECTOMY  2007   Wisdom Teeth Removal  1998   Patient Active Problem List   Diagnosis Date Noted   GERD (gastroesophageal reflux disease) 10/04/2024   Back pain 04/02/2024   MDD (major depressive disorder), recurrent, in full remission 03/19/2024   Attention and concentration deficit 03/19/2024   Epididymitis 06/15/2023   Cold sore 10/13/2022   History of URI (upper respiratory infection) 10/13/2022   Gout 05/19/2022   Change in weight 05/19/2022   On pre-exposure prophylaxis for HIV 05/19/2022   MDD (major depressive disorder), recurrent episode, mild 03/03/2022   Insomnia due to other mental disorder 03/03/2022   Insomnia due to medical condition 12/23/2021   GAD (generalized anxiety disorder) 10/16/2021   Major depressive disorder, recurrent episode, moderate (HCC) 10/16/2021   Counseling on health promotion and disease prevention 10/14/2021   Routine general medical examination at a health care facility 12/14/2015   Advance care planning 12/14/2015   HLD (hyperlipidemia) 01/28/2015   Hyperglycemia 01/28/2015   Major depression 05/22/2014     PCP: Arlyss GORMAN Solian, MD   REFERRING PROVIDER: Arlyss GORMAN Solian, MD   REFERRING DIAG: M54.9 (ICD-10-CM) - Back pain, unspecified back location, unspecified back pain laterality, unspecified chronicity  Rationale for Evaluation and Treatment: Rehabilitation  THERAPY DIAG:  Other low back pain - Plan: PT plan of care cert/re-cert  Radiculopathy, lumbosacral region - Plan: PT plan of care cert/re-cert  Other abnormalities of gait and mobility - Plan: PT plan of care cert/re-cert  Other symptoms and signs involving the musculoskeletal system - Plan: PT plan of care cert/re-cert  Abnormal posture - Plan: PT plan of care cert/re-cert  ONSET DATE: February 2025, worsened again several times since then (April was worst)  SUBJECTIVE:  SUBJECTIVE STATEMENT: Last week (Thursday) back seized up when bending over. Has gradually improved but has also been taking it easier.  PERTINENT HISTORY:  Patient endorsing lower left lumbar pain with insidious onset following decrease in physical activity secondary to chronic epididymitis in late 2024. Patient has undergone pelvic floor PT in the past but noted that those exercises were causing an increase in pain. Patient also endorses that he believes what has worsened the pain is only sleeping in one comfortable position. Patient endorses no previous injuries, trauma, or surgeries to the area or surrounding joints. Patient has not received injections.  Patient wears a belt/brace to assist with long car rides.  See PMH or personal factors for in depth comorbidities   PAIN:  NPRS scale: 0/10 currently Pain location: Lt lower back, intermittent sciatic pain down Lt LE (less frequent) Pain description: sharp, achy, intermittent nerve pain Aggravating factors: bending,  lifting, twisting, activity  Relieving factors: heat, TENS  PRECAUTIONS: None  RED FLAGS: Bowel or bladder incontinence: No and Cauda equina syndrome: No   WEIGHT BEARING RESTRICTIONS: No  FALLS:  Has patient fallen in last 6 months? No  LIVING ENVIRONMENT: Lives with: lives alone Lives in: House/apartment Stairs: No Has following equipment at home: None  OCCUPATION: marketing/communications (desk), photography   PLOF: Independent  PATIENT GOALS: be more comfortable moving, being more flexible   NEXT MD VISIT: not currently scheduled   OBJECTIVE:  Note: Objective measures were completed at Evaluation unless otherwise noted.  DIAGNOSTIC FINDINGS:  IMPRESSION: 1. Mild multilevel lumbar spondylosis without significant spinal stenosis or overt neural impingement. 2. Mild right L4 foraminal narrowing no other significant foraminal encroachment within the lumbar spine.  PATIENT SURVEYS:  PSFS: THE PATIENT SPECIFIC FUNCTIONAL SCALE  Place score of 0-10 (0 = unable to perform activity and 10 = able to perform activity at the same level as before injury or problem)  Activity Date: 09/04/2024    Bending  5    2. Lifting  7    3. Stretching  5    4. Sleeping  6    5. Sitting to standing 4    Total Score 5.4      Total Score = Sum of activity scores/number of activities  Minimally Detectable Change: 3 points (for single activity); 2 points (for average score)  [facility] (nd). The Patient Specific Functional Scale . Retrieved from [email or web address]   COGNITION: 09/04/2024 Overall cognitive status: Within functional limits for tasks assessed     SENSATION: 09/04/2024 Light touch: WFL  MUSCLE LENGTH: 09/04/2024 Not objectively measured, though appears tight with straight leg raise and patient endorsing difficulty with some positions   POSTURE: 09/04/2024  rounded shoulders, forward head, and  increased thoracic kyphosis  PALPATION: 09/04/2024 Not TTP in lumbar musculature, bilat glutes, bilat SIJ or sacrum  LUMBAR ROM: painful when returning to starting position in most ROM  AROM Eval 09/04/2024 10/02/2024  Flexion WFL To toes no complaints  Extension WFL 100 % WFL no complaints.   Right lateral flexion WFL   Left lateral flexion WFL   Right rotation [facility]   Left rotation WFL    (Blank rows = not tested)  LOWER EXTREMITY ROM:     Passive  Right Eval 09/04/2024 Left Eval 09/04/2024  Hip flexion [facility]  Hip extension    Hip abduction    Hip adduction    Hip internal rotation    Hip external rotation    Knee flexion  Knee extension    Ankle dorsiflexion    Ankle plantarflexion    Ankle inversion    Ankle eversion     (Blank rows = not tested)  LOWER EXTREMITY MMT:    MMT Right Eval 09/04/2024 Left Eval 09/04/2024  Hip flexion (seated) 5/5 5/5  Hip extension (prone) 5/5 5/5  Hip abduction (sidelying) 5/5 5/5  Hip adduction    Hip internal rotation    Hip external rotation    Knee flexion (seated) 5/5 5/5  Knee extension (seated) 5/5 5/5  Ankle dorsiflexion (seated) 5/5 5/5  Ankle plantarflexion    Ankle inversion    Ankle eversion     (Blank rows = not tested)  LUMBAR SPECIAL TESTS:  09/04/2024 Straight leg raise test: Negative, Slump test: Negative, FABER test: Negative, and FADIR negative   FUNCTIONAL TESTS:  09/04/2024 Not tested   GAIT: 09/04/2024 Distance walked: not objectively measured  Assistive device utilized: None Level of assistance: supervision Comments: no overt gait deviations noted                 TREATMENT 10/23/24 STM with compression to Lt glute med, QL and lumbar paraspinals; skilled palpation and monitoring of soft tissue during DN  Trigger Point Dry Needling  Initial Treatment: Pt instructed on Dry Needling rational, procedures, and possible side effects. Pt instructed to expect mild to moderate muscle  soreness later in the day and/or into the next day.  Pt instructed in methods to reduce muscle soreness. Pt instructed to continue prescribed HEP. Patient was educated on signs and symptoms of infection and other risk factors and advised to seek medical attention should they occur.  Patient verbalized understanding of these instructions and education.   Patient Verbal Consent Given: Yes Education Handout Provided: Yes Muscles Treated: Lt glute med, Lt QL Electrical Stimulation Performed: No Treatment Response/Outcome: twitch response; with decreased symptoms following    10/02/2024 Therex: Standing lumbar extension AROM x 5 Supine hooklying figure 4 pull towards 30 sec x 3 bilateral Supine bridge 2 x 10 2-3 sec hold.  Discussed progression to figure 4 single leg bridge Prone opposite arm/leg lift 3 sec hold x 10 bilaterally Standing doorway hip hike 3 sec hold x 10 bilaterally   Discussed prone plank holds and seated UE press into table for core activation, written on HEP printout.   Time spent in review of HEP updates.   Manual Percussive device to Lt QL, Lt glute max, min/med    09/18/24 NuStep L5 x 5 min Discussed current HEP and plan to help with exercise progression if needed; pt verbalized understanding  STM with compression to Lt glute med, QL and lumbar paraspinals; skilled palpation and monitoring of soft tissue during DN  Trigger Point Dry Needling  Initial Treatment: Pt instructed on Dry Needling rational, procedures, and possible side effects. Pt instructed to expect mild to moderate muscle soreness later in the day and/or into the next day.  Pt instructed in methods to reduce muscle soreness. Pt instructed to continue prescribed HEP. Patient was educated on signs and symptoms of infection and other risk factors and advised to seek medical attention should they occur.  Patient verbalized understanding of these instructions and education.   Patient Verbal  Consent Given: Yes Education Handout Provided: Yes Muscles Treated: Lt glute med Electrical Stimulation Performed: No Treatment Response/Outcome: twitch response; only able to tolerate single needle stick    09/04/2024 TherEx:  HEP handout provided with patient performing one set of each activity  for appropriate form. Verbal and tactile cues provided.           PT discussed dry needling options.                                 Self-Care:  POC, importance of completing HEP with the combination of dry needling and in session activities                                                                                          PATIENT EDUCATION:  10/02/2024 Education details: HEP update Person educated: Patient Education method: Explanation, Demonstration, Tactile cues, Verbal cues, and Handouts Education comprehension: verbalized understanding, returned demonstration, and verbal cues required  HOME EXERCISE PROGRAM: Access Code: [email or web address] URL: [email or web address] Date: 10/02/2024 Prepared by: Ozell Silvan  Exercises - Child's Pose Stretch  - 1 x daily - 7 x weekly - 3 sets - 30s hold - Child's Pose with Sidebending  - 1 x daily - 7 x weekly - 3 sets - 30s hold - Standing Quadratus Lumborum Stretch with Doorway  - 1 x daily - 7 x weekly - 3 sets - 30s hold - Supine Hamstring Stretch with Strap  - 1 x daily - 7 x weekly - 3 sets - 15-30s hold - Seated Figure 4 Piriformis Stretch  - 1 x daily - 7 x weekly - 3 sets - 30s hold - Standing Hip Flexor Stretch  - 1 x daily - 7 x weekly - 3 sets - 30s hold - Standing Lumbar Extension  - 2-4 x daily - 7 x weekly - 1 sets - 5-10 reps - Supine Bridge  - 1 x daily - 7 x weekly - 1-2 sets - 10 reps - 2 hold - Standing Hip Hiking  - 1 x daily - 7 x weekly - 1 sets - 10 reps - 5 hold - Prone Alternating Arm and Leg Lifts  - 1 x daily - 7 x weekly - 1-2 sets - 10 reps - 3 hold  ASSESSMENT:  CLINICAL IMPRESSION: Pt with good  response to DN today but still with difficulty tolerating DN.  At this time all goals ongoing and will see up to 1x/wk as needed to maximize function.   OBJECTIVE IMPAIRMENTS: decreased activity tolerance, decreased coordination, decreased endurance, increased muscle spasms, impaired flexibility, improper body mechanics, postural dysfunction, and pain.   ACTIVITY LIMITATIONS: lifting, bending, sitting, standing, sleeping, bed mobility, and toileting  PARTICIPATION LIMITATIONS: cleaning, laundry, community activity, and occupation  PERSONAL FACTORS: Past/current experiences, Time since onset of injury/illness/exacerbation, and 3+ comorbidities: gout, depression, anxiety, migraines, chronic epididymitis, HLD are also affecting patient's functional outcome.   REHAB POTENTIAL: Good  CLINICAL DECISION MAKING: Stable/uncomplicated  EVALUATION COMPLEXITY: Low   GOALS: Goals reviewed with patient? Yes  SHORT TERM GOALS: Target date: 09/25/2024  Patient will show compliance with initial HEP.  Baseline: Goal status: Met 10/02/2024  2.  Patient will report pain levels no greater than 5/10 in order to show improved overall quality of life. Baseline:  Goal status: Met 10/02/2024  LONG TERM GOALS: Target date: 11/21/23  Patient will be independent with final HEP in order to maintain and progress upon functional gains made within PT.  Baseline:  Goal status: on going 10/23/24  2.  Patient will report pain levels no greater than 3/10 in order to show improved overall quality of life. Baseline:  Goal status: on going 10/23/24  3.  Patient will increase PSFS to at least 7.4 in order to show a significant improvement in subjective disability rating.  Baseline:  Goal status: on going 10/23/24  4.  Patient will report ability to perform household tasks, specifically laundry, with improved anxiety/fear levels with pain.  Baseline:  Goal status: on going 10/23/24     PLAN:  PT  FREQUENCY: 1x/week  PT DURATION: 4 weeks  PLANNED INTERVENTIONS: 97164- PT Re-evaluation, 97750- Physical Performance Testing, 97110-Therapeutic exercises, 97530- Therapeutic activity, [id number]- Neuromuscular re-education, 97535- Self Care, [id number]- Manual therapy, [id number]- Gait training, [phone]- Orthotic Initial, [phone]- Orthotic/Prosthetic subsequent, [phone]- Canalith repositioning, [phone]- Aquatic Therapy, [phone]- Electrical stimulation (unattended), [phone]- Electrical stimulation (manual), [id number]- Vasopneumatic device, [id number]- Ultrasound, [id number]- Traction (mechanical), [id number]- Ionotophoresis 4mg /ml Dexamethasone, [id number] (1-2 muscles), [id number] (3+ muscles)- Dry Needling, Patient/Family education, Balance training, Stair training, Taping, Joint mobilization, Joint manipulation, Spinal manipulation, Spinal mobilization, Compression bandaging, Vestibular training, DME instructions, Cryotherapy, and Moist heat.  PLAN FOR NEXT SESSION: assess response to DN, repeat PRN     Corean JULIANNA Ku, PT, DPT 10/23/2024 9:36 AM     PHYSICAL THERAPY DISCHARGE SUMMARY  Visits from Start of Care: 4  Current functional level related to goals / functional outcomes: See note   Remaining deficits: See note   Education / Equipment: HEP  Patient goals were partially met. Patient is being discharged due to not returning since the last visit.   Ozell Silvan, PT, DPT, OCS, ATC 12/12/24  11:08 AM     "

## 2024-10-24 NOTE — Progress Notes (Signed)
 Agree.  Thanks.  Arlyss EDISON Cleatus, MD 10/24/2024  1:23 PM

## 2024-11-06 ENCOUNTER — Other Ambulatory Visit: Payer: Self-pay | Admitting: Family Medicine

## 2024-11-06 DIAGNOSIS — M5412 Radiculopathy, cervical region: Secondary | ICD-10-CM | POA: Diagnosis not present

## 2024-11-06 DIAGNOSIS — M5126 Other intervertebral disc displacement, lumbar region: Secondary | ICD-10-CM | POA: Diagnosis not present

## 2024-11-06 DIAGNOSIS — M47816 Spondylosis without myelopathy or radiculopathy, lumbar region: Secondary | ICD-10-CM | POA: Diagnosis not present

## 2024-11-07 NOTE — Telephone Encounter (Signed)
 As far as I can tell he is due for CPE.  Refilled #[phone] with request to schedule CPE.  Will forward to PCP

## 2024-11-11 NOTE — Telephone Encounter (Signed)
 Agreed, please see about setting up CPE this spring when possible.

## 2024-11-13 ENCOUNTER — Ambulatory Visit: Admitting: Psychology

## 2024-11-13 DIAGNOSIS — F411 Generalized anxiety disorder: Secondary | ICD-10-CM

## 2024-11-13 DIAGNOSIS — F3342 Major depressive disorder, recurrent, in full remission: Secondary | ICD-10-CM | POA: Diagnosis not present

## 2024-11-13 NOTE — Progress Notes (Unsigned)
  Behavioral Health Counselor/Therapist Progress Note  Patient ID: Brandon Jackson, MRN: [id number]   Date: 11/13/2024  Time Spent: 5:05 pm - 5:56 pm : 51 Minutes  Treatment Type: Individual Therapy.  Reported Symptoms: Depression and anxiety.   Mental Status Exam: Appearance:  Neat and Well Groomed     Behavior: Appropriate  Motor: Normal  Speech/Language:  Clear and Coherent  Affect: Appropriate  Mood: anxious  Thought process: normal  Thought content:   WNL  Sensory/Perceptual disturbances:   WNL  Orientation: oriented to person, place, time/date, and situation  Attention: Good  Concentration: Good  Memory: WNL  Fund of knowledge:  Good  Insight:   Good  Judgment:  Good  Impulse Control: Good   Risk Assessment: Danger to Self:  No Self-injurious Behavior: No Danger to Others: No Duty to Warn:no Physical Aggression / Violence:No  Access to Firearms a concern: No  Gang Involvement:No   Subjective:   Brandon Jackson participated from office, via video and consented to treatment. Therapist participated from office. Sebasthian reviewed the events of the past week. He noted work related stressors in relation to an expansive work related task. He noted previously becoming of a hypochondriac due to a slew of illnesses during the past year. He noted his effort to be mindful of this and challenging this proactively. He noted his efforts not to check in with self, as often, about getting a sick. He noted his effort to not frame this as my body is failing me. We worked on identifying alterative more adaptive framing. We practiced this during the session. He noted a big win would be a week without worrying about my back or aggravating an issue. We worked on exploring his self-talk and framing during times related to distress centered around being sick. We worked on exploring possible contributing factors to his anxiety. Kristin noted that getting sick uses up my free time  and messes up my plans. He noted his frustration regarding the malaise and brain fog that can come by being sick and added it takes away the sliver of fun and missed opportunities. We worked processing this during the session. We worked on exploring his attempts to manage this. We explored areas of control and lack of control, self-talk and framing, and any other possible contributing factors to his anxiety. Therapist encouraged Dalin to be mindful of self-talk and framing, challenge negative thoughts and feelings, and reflect on the steps he takes to reasonably safeguard self. Robbert noted continued difference in political leanings between him and his partner and noted this possibly reflective of difference in values and be a larger concern regarding compatibility. He noted this and his partner's lack of acknowledgement of their relationship due to Mike's partner not being out. He noted these concerns, at some point, would need to communicated about and addressed. He noted his effort to reduce his alcohol consumption upon the recognition of an increased intake and noted his intent to continue with this change. Therapist praised Jarmaine for his awareness and effort in this area. Therapist validated Davione's feelings and experience, encouraged self-care, and provided supportive therapy. A follow-up was scheduled for continued treatment, which Aceyn benefits from.   Interventions: CBT & interpersonal  Diagnosis:  MDD (major depressive disorder), recurrent, in full remission  GAD (generalized anxiety disorder)  Psychiatric Treatment: Yes , VIA psychiatrist.     Treatment Plan:  Client Abilities/Strengths Zaidan is intelligent, self-aware, and motivated for change.   Support System: Family and friends.  Client Treatment Preferences OPT.   Client Statement of Needs Juaquin would like to increase assertiveness, advocating for self, engaging in mindfulness, developing home and  work-life balance, addressing perfectionism, improving self-talk and identifying positives about self. Additional clinical goals include symptom management, relaxation, self-care, processing events, and bolstering coping skills.   Treatment Level Weekly  Symptoms  Anxiety: Feeling anxiety, difficulty stopping worry, worrying about different things, trouble relaxing, irritability, and feeling afraid something awful might happen.   (Status: maintained) Depression: Loss of interest, fluctuating sleep, fluctuating appetite, feeling bad about self, difficulty concentrating.    (Status: maintained)  Goals:   Nyquan experiences symptoms of of depression and anxiety.   Treatment plan signed and available on s-drive:   Yes, please see patient chart for sign off.  Brandon Jackson was sent the treatment plan signature form on 04/24/24.   Target Date: 04/23/25 Frequency: Weekly  Progress: 0 Modality: individual    Therapist will provide referrals for additional resources as appropriate.  Therapist will provide psycho-education regarding Brandon Jackson's diagnosis and corresponding treatment approaches and interventions. [name], LCSW will support the patient's ability to achieve the goals identified. will employ CBT, BA, Problem-solving, Solution Focused, Mindfulness,  coping skills, & other evidenced-based practices will be used to promote progress towards healthy functioning to help manage decrease symptoms associated with his diagnosis.   Reduce overall level, frequency, and intensity of the feelings of depression, anxiety and panic evidenced by decreased overall symptoms from 6 to 7 days/week to 0 to 1 days/week per client report for at least 3 consecutive months. Verbally express understanding of the relationship between feelings of depression, anxiety and their impact on thinking patterns and behaviors. Verbalize an understanding of the role that distorted thinking plays in creating fears,  excessive worry, and ruminations.    Tatiana participated in the creation of the treatment plan)   [name], LCSW

## 2024-11-19 ENCOUNTER — Encounter: Payer: Self-pay | Admitting: Medical

## 2024-11-19 ENCOUNTER — Ambulatory Visit (INDEPENDENT_AMBULATORY_CARE_PROVIDER_SITE_OTHER): Payer: Self-pay | Admitting: Medical

## 2024-11-19 DIAGNOSIS — J019 Acute sinusitis, unspecified: Secondary | ICD-10-CM

## 2024-11-19 MED ORDER — AMOXICILLIN-POT CLAVULANATE 875-125 MG PO TABS: 1.0000 | ORAL_TABLET | Freq: Two times a day (BID) | ORAL | 0 refills | Status: AC

## 2024-11-19 NOTE — Progress Notes (Signed)
 [facility] [address] [place], [place] [address]   Office Visit Note  Patient Name: Brandon Jackson Date of Birth [id number]  Medical Record number [id number]  Date of Service: 11/19/2024  Chief Complaint  Patient presents with   Sinusitis   Virtual Visit via Telephone Note  I connected with [name] on 11/19/2024 at 11:00 AM EST by telephone and verified that I am speaking with the correct person using two identifiers.  Location: Patient: Home Provider: [facility]   I discussed the limitations, risks, security and privacy concerns of performing an evaluation and management service by telephone and the availability of in person appointments. The patient expressed understanding and agreed to proceed.   44 y.o. male presents via telephone with persistent sinus pain.  Left-sided sinus pain for few weeks. Tends to have sinus infection a couple times a year, last had one in April/May 2025. Having teeth pain to upper jaw on left side, which is typical of past sinus infections. Pain has gotten more persistent/frequent recently. Sometimes has yellow-green nasal d/c, but blowing his nose does not relieve pressure/congestion. No cough or sore throat. No fever or chills. Has had associated sinus HA.  Has taken Phenylephrine and Ibuprofen, some helpful. Occasional Flonase . Has a neti pot but has not yet tried it.   Current Medication:  Outpatient Encounter Medications as of 11/19/2024  Medication Sig   allopurinol  (ZYLOPRIM ) 100 MG tablet Take 1 tablet by mouth twice daily.   Ascorbic Acid (VITAMIN C PO) Take by mouth.   B Complex Vitamins (VITAMIN B COMPLEX PO) Take by mouth.   buPROPion  ER (WELLBUTRIN  SR) 100 MG 12 hr tablet Take 1 tablet (100 mg total) by mouth 2 (two) times daily.   busPIRone  (BUSPAR ) 15 MG tablet TAKE 1 TABLET (15 MG TOTAL) BY MOUTH 2 (TWO) TIMES DAILY. DOSE CHANGE ,   cetirizine   (ZYRTEC ) 10 MG tablet Take 1 tablet (10 mg total) by mouth daily.   clonazePAM  (KLONOPIN ) 0.5 MG tablet TAKE 1/2 TO 1 TABLET BY MOUTH TWICE A DAY IF NEEDED FOR ANXIETY AND 1/2 TO 1 TABLET AT NIGHT IF NEEDED FOR INSOMNIA, CAUTION SEDATION.   Cranberry 500 MG CAPS Take by mouth.   doxepin  (SINEQUAN ) 10 MG capsule TAKE 1-2 CAPSULES (10-20 MG TOTAL) BY MOUTH AT BEDTIME AS NEEDED.   emtricitabine -tenofovir  (TRUVADA) 200-300 MG tablet Take 1 tablet by mouth daily. Please schedule physical prior to more refills   famotidine  (PEPCID ) 20 MG tablet Take 1 tablet (20 mg total) by mouth daily as needed for heartburn or indigestion.   fluticasone  (FLONASE ) 50 MCG/ACT nasal spray Place 1 spray into both nostrils as needed.   Lysine 1000 MG TABS    meloxicam  (MOBIC ) 15 MG tablet Take 1 tablet (15 mg total) by mouth as needed.   Multiple Vitamin (MULTIVITAMIN) tablet Take 1 tablet by mouth daily.   Omega-3 Fatty Acids (FISH OIL) 300 MG CAPS Take 300 mg by mouth daily.   predniSONE  (DELTASONE ) 10 MG tablet Take 2 a day for 5 days, then 1 a day for 5 days, with food. Don't take with aleve/ibuprofen/meloxicam .   tadalafil (CIALIS) 5 MG tablet Take 5 mg by mouth daily.   valACYclovir  (VALTREX ) 500 MG tablet TAKE 1 TABLET (500 MG TOTAL) BY MOUTH DAILY.   omeprazole  (PRILOSEC) 20 MG capsule Take 1 capsule (20 mg total) by mouth daily. (Patient not taking: Reported on 11/19/2024)   [DISCONTINUED] hydrOXYzine  (  VISTARIL ) 25 MG capsule Take 1 to 3 capsules by mouth at bedtime as needed for anxiety. (Patient not taking: Reported on 11/19/2024)   No facility-administered encounter medications on file as of 11/19/2024.      Medical History: Past Medical History:  Diagnosis Date   Anxiety    Depression    Frequent headaches    Gout    History of chicken pox    Migraines    Ulcer      Vital Signs: There were no vitals taken for this visit.   Review of Systems  Constitutional:  Positive for fatigue. Negative  for chills and fever.  HENT:  Positive for congestion, sinus pressure and sinus pain. Negative for ear pain, rhinorrhea, sore throat and trouble swallowing.   Respiratory:  Negative for cough and shortness of breath.   Musculoskeletal:  Negative for myalgias, neck pain and neck stiffness.  Neurological:  Positive for headaches.    Physical Exam Patient sounds alert and in no acute distress.   Assessment/Plan: 1. Acute non-recurrent sinusitis, unspecified location (Primary) History provided by patient consistent with acute sinusitis. Given persistence of symptoms over last 2 weeks with recent worsening, will give antibiotic for possible bacterial infection. Also discussed appropriate supportive measures and symptomatic treatment.  - amoxicillin -clavulanate (AUGMENTIN ) 875-125 MG tablet; Take 1 tablet by mouth 2 (two) times daily for 10 days.  Dispense: 20 tablet; Refill: 0   Patient Instructions  -Take complete course of antibiotics as prescribed.  Take with food.   -Rest and stay well hydrated (by drinking water and other liquids).  -Take over-the-counter medicines (i.e. Sudafed/Pseudoephedrine, Ibuprofen) to help relieve your symptoms. -Use Flonase /Fluticasone  nasal spray, 2 sprays to each nostril once a day. -Consider using a neti pot or saline sinus rinse once or twice daily to help relieve congestion. I recommend using distilled water. -Send MyChart message to provider, call or schedule return visit as needed for new/worsening symptoms  (i.e. fever, increased sinus pain) or if symptoms do not improve as discussed with antibiotic and other recommended treatment over the next 5-7 days.    General Counseling: nigil braman understanding of the findings of todays visit and plan of treatment. he has been encouraged to call the office with any questions or concerns that should arise related to todays visit.    Time spent:20 Minutes    Joen Arts PA-C Physician Assistant

## 2024-11-19 NOTE — Patient Instructions (Addendum)
-  Take complete course of antibiotics as prescribed.  Take with food.   -Rest and stay well hydrated (by drinking water and other liquids).  -Take over-the-counter medicines (i.e. Sudafed/Pseudoephedrine, Ibuprofen) to help relieve your symptoms. -Use Flonase /Fluticasone  nasal spray, 2 sprays to each nostril once a day. -Consider using a neti pot or saline sinus rinse once or twice daily to help relieve congestion. I recommend using distilled water. -Send MyChart message to provider, call or schedule return visit as needed for new/worsening symptoms  (i.e. fever, increased sinus pain) or if symptoms do not improve as discussed with antibiotic and other recommended treatment over the next 5-7 days.

## 2024-11-27 ENCOUNTER — Telehealth: Admitting: Psychiatry

## 2024-11-27 ENCOUNTER — Encounter: Payer: Self-pay | Admitting: Psychiatry

## 2024-11-27 DIAGNOSIS — F5105 Insomnia due to other mental disorder: Secondary | ICD-10-CM

## 2024-11-27 DIAGNOSIS — F411 Generalized anxiety disorder: Secondary | ICD-10-CM | POA: Diagnosis not present

## 2024-11-27 DIAGNOSIS — F3342 Major depressive disorder, recurrent, in full remission: Secondary | ICD-10-CM

## 2024-11-27 DIAGNOSIS — F99 Mental disorder, not otherwise specified: Secondary | ICD-10-CM

## 2024-11-27 NOTE — Progress Notes (Unsigned)
 Virtual Visit via Video Note  I connected with Brandon Jackson on 11/27/24 at 11:40 AM EST by a video enabled telemedicine application and verified that I am speaking with the correct person using two identifiers.  Location Provider Location : [facility] Patient Location : Home  Participants: Patient , Provider   I discussed the limitations of evaluation and management by telemedicine and the availability of in person appointments. The patient expressed understanding and agreed to proceed.   I discussed the assessment and treatment plan with the patient. The patient was provided an opportunity to ask questions and all were answered. The patient agreed with the plan and demonstrated an understanding of the instructions.   The patient was advised to call back or seek an in-person evaluation if the symptoms worsen or if the condition fails to improve as anticipated.    BH MD OP Progress Note  11/27/2024 12:23 PM DAGAN HEINZ  MRN:  [id number]  Chief Complaint:  Chief Complaint  Patient presents with   Follow-up   Anxiety   Depression   Medication Refill   Discussed the use of AI scribe software for clinical note transcription with the patient, who gave verbal consent to proceed.  History of Present Illness Brandon Jackson is a 44 year old Caucasian male, currently employed, lives in [place], has a history of MDD, GAD, hyperlipidemia, insomnia was evaluated by telemedicine today for a follow-up appointment.  He continues to focus on ongoing management of anxiety and work-related stress, with a recent period of increased pressure related to preparations for a law school opening in [place]. He describes heightened stress over the holidays, including several nights with work-related stress dreams and waking with work as the first thought. As the deadline approached, he reports that anxiety and stress improved, and he now feels his work-life balance and anxiety are much  better. He does not feel consumed by worry and describes his current approach to stress as healthier and more realistic.  He reports that he continues to engage with his therapist. Over the past year, he notes increased confidence and professional growth, which he attributes to his move to the law school and the responsibilities associated with his role.  He continues to experience sleep difficulties, with sleep not as deep as when he first started his current medication regimen. He identifies work stress, travel, reduced exercise, and less sunlight exposure during the winter as contributing factors. Each morning, he uses a light therapy lamp for approximately 20-30 minutes. He reports that doxepin  sometimes causes grogginess the next day, especially when taking 2 capsules, and he usually takes 1 capsule. He rarely or never uses hydroxyzine , which his provider prescribed as needed. He has used clonazepam  more recently, primarily in December, to manage increased stress. His current medication regimen includes Buspar , hydroxyzine  (as needed), Wellbutrin , doxepin , and clonazepam  (used more frequently during periods of heightened stress).  She denies any significant side effects to medications otherwise.  He denies any thoughts of hurting himself or others.  Denies any perceptual disturbances.  He engaged in therapy with Mr.Osman.    Visit Diagnosis:    ICD-10-CM   1. MDD (major depressive disorder), recurrent, in full remission  F33.42     2. GAD (generalized anxiety disorder)  F41.1     3. Insomnia due to other mental disorder  F51.05    F99       Past Psychiatric History: I have reviewed past psychiatric history from progress note on 12/23/2021.  Past trials of  Pristiq , duloxetine, Lexapro , Lunesta , Effexor , multiple other medications  Past Medical History:  Past Medical History:  Diagnosis Date   Anxiety    Depression    Frequent headaches    Gout    History of chicken pox     Migraines    Ulcer     Past Surgical History:  Procedure Laterality Date   CYSTECTOMY  2003   TONSILLECTOMY AND ADENOIDECTOMY  2007   Wisdom Teeth Removal  1998    Family Psychiatric History: I have reviewed family psychiatric history from progress note on 12/23/2021  Family History:  Family History  Problem Relation Age of Onset   Depression Mother    Diabetes Father    Hyperlipidemia Father    Hypertension Father    Colon cancer Neg Hx    Prostate cancer Neg Hx     Social History: I have reviewed social history from progress note on 12/23/2021 Social History   Socioeconomic History   Marital status: Significant Other    Spouse name: Not on file   Number of children: Not on file   Years of education: College   Highest education level: Bachelor's degree (e.g., BA, AB, BS)  Occupational History   Occupation: Photographer: [employer]  Tobacco Use   Smoking status: Former    Current packs/day: 0.00    Types: Cigarettes    Quit date: 2013    Years since quitting: 13.0    Passive exposure: Past   Smokeless tobacco: Never  Vaping Use   Vaping status: Never Used  Substance and Sexual Activity   Alcohol use: Yes    Comment: occasional   Drug use: No   Sexual activity: Not on file  Other Topics Concern   Not on file  Social History Narrative   Single   Was in committed relationship 2007-2023.   [employer] grad '03   Working at [employer] as of 2019, prev newspaper work in [place].     Social Drivers of Health   Tobacco Use: Medium Risk (11/27/2024)   Patient History    Smoking Tobacco Use: Former    Smokeless Tobacco Use: Never    Passive Exposure: Past  Physicist, Medical Strain: Low Risk (10/01/2024)   Overall Financial Resource Strain (CARDIA)    Difficulty of Paying Living Expenses: Not very hard  Food Insecurity: No Food Insecurity (10/01/2024)   Epic    Worried About Programme Researcher, Broadcasting/film/video in the Last Year: Never true    Ran Out of Food in the  Last Year: Never true  Transportation Needs: No Transportation Needs (10/01/2024)   Epic    Lack of Transportation (Medical): No    Lack of Transportation (Non-Medical): No  Physical Activity: Sufficiently Active (10/01/2024)   Exercise Vital Sign    Days of Exercise per Week: 3 days    Minutes of Exercise per Session: 50 min  Stress: Stress Concern Present (10/01/2024)   [employer] of Occupational Health - Occupational Stress Questionnaire    Feeling of Stress: To some extent  Social Connections: Socially Isolated (10/01/2024)   Social Connection and Isolation Panel    Frequency of Communication with Friends and Family: Twice a week    Frequency of Social Gatherings with Friends and Family: Once a week    Attends Religious Services: Never    Database Administrator or Organizations: No    Attends Engineer, Structural: Not on file    Marital Status: Divorced  Depression (PHQ2-9):  Low Risk (10/02/2024)   Depression (PHQ2-9)    PHQ-2 Score: 4  Alcohol Screen: Low Risk (10/01/2024)   Alcohol Screen    Last Alcohol Screening Score (AUDIT): 3  Housing: Low Risk (10/01/2024)   Epic    Unable to Pay for Housing in the Last Year: No    Number of Times Moved in the Last Year: 0    Homeless in the Last Year: No  Utilities: Not on file  Health Literacy: Not on file    Allergies: Allergies[1]  Metabolic Disorder Labs: No results found for: HGBA1C, MPG No results found for: PROLACTIN Lab Results  Component Value Date   CHOL 154 05/17/2022   TRIG 111.0 05/17/2022   HDL 49.60 05/17/2022   CHOLHDL 3 05/17/2022   VLDL 22.2 05/17/2022   LDLCALC 82 05/17/2022   LDLCALC 82 10/12/2021   Lab Results  Component Value Date   TSH 1.24 05/17/2022   TSH 1.310 01/05/2022    Therapeutic Level Labs: No results found for: LITHIUM No results found for: VALPROATE No results found for: CBMZ  Current Medications: Current Outpatient Medications  Medication Sig  Dispense Refill   allopurinol  (ZYLOPRIM ) 100 MG tablet Take 1 tablet by mouth twice daily. 180 tablet 2   amoxicillin -clavulanate (AUGMENTIN ) 875-125 MG tablet Take 1 tablet by mouth 2 (two) times daily for 10 days. 20 tablet 0   Ascorbic Acid (VITAMIN C PO) Take by mouth.     B Complex Vitamins (VITAMIN B COMPLEX PO) Take by mouth.     buPROPion  ER (WELLBUTRIN  SR) 100 MG 12 hr tablet Take 1 tablet (100 mg total) by mouth 2 (two) times daily. 180 tablet 3   busPIRone  (BUSPAR ) 15 MG tablet TAKE 1 TABLET (15 MG TOTAL) BY MOUTH 2 (TWO) TIMES DAILY. DOSE CHANGE , 180 tablet 1   cetirizine  (ZYRTEC ) 10 MG tablet Take 1 tablet (10 mg total) by mouth daily.     clonazePAM  (KLONOPIN ) 0.5 MG tablet TAKE 1/2 TO 1 TABLET BY MOUTH TWICE A DAY IF NEEDED FOR ANXIETY AND 1/2 TO 1 TABLET AT NIGHT IF NEEDED FOR INSOMNIA, CAUTION SEDATION. 20 tablet 1   Cranberry 500 MG CAPS Take by mouth.     doxepin  (SINEQUAN ) 10 MG capsule TAKE 1-2 CAPSULES (10-20 MG TOTAL) BY MOUTH AT BEDTIME AS NEEDED. 180 capsule 1   emtricitabine -tenofovir  (TRUVADA) 200-300 MG tablet Take 1 tablet by mouth daily. Please schedule physical prior to more refills 90 tablet 0   famotidine  (PEPCID ) 20 MG tablet Take 1 tablet (20 mg total) by mouth daily as needed for heartburn or indigestion.     fluticasone  (FLONASE ) 50 MCG/ACT nasal spray Place 1 spray into both nostrils as needed.     Lysine 1000 MG TABS      meloxicam  (MOBIC ) 15 MG tablet Take 1 tablet (15 mg total) by mouth as needed.     Multiple Vitamin (MULTIVITAMIN) tablet Take 1 tablet by mouth daily.     Omega-3 Fatty Acids (FISH OIL) 300 MG CAPS Take 300 mg by mouth daily.     omeprazole  (PRILOSEC) 20 MG capsule Take 1 capsule (20 mg total) by mouth daily. (Patient not taking: Reported on 11/19/2024)     predniSONE  (DELTASONE ) 10 MG tablet Take 2 a day for 5 days, then 1 a day for 5 days, with food. Don't take with aleve/ibuprofen/meloxicam . 15 tablet 0   tadalafil (CIALIS) 5 MG tablet  Take 5 mg by mouth daily.     valACYclovir  (VALTREX )  500 MG tablet TAKE 1 TABLET (500 MG TOTAL) BY MOUTH DAILY. 90 tablet 1   No current facility-administered medications for this visit.     Musculoskeletal: Strength & Muscle Tone: UTA Gait & Station: Seated Patient leans: N/A  Psychiatric Specialty Exam: Review of Systems  Psychiatric/Behavioral:  Positive for sleep disturbance. The patient is nervous/anxious.     There were no vitals taken for this visit.There is no height or weight on file to calculate BMI.  General Appearance: Fairly Groomed  Eye Contact:  Fair  Speech:  Clear and Coherent  Volume:  Normal  Mood:  Anxious improving  Affect:  Congruent  Thought Process:  Goal Directed and Descriptions of Associations: Intact  Orientation:  Full (Time, Place, and Person)  Thought Content: Logical   Suicidal Thoughts:  No  Homicidal Thoughts:  No  Memory:  Immediate;   Fair Recent;   Fair Remote;   Fair  Judgement:  Fair  Insight:  Fair  Psychomotor Activity:  Normal  Concentration:  Concentration: Fair and Attention Span: Fair  Recall:  [employer] of Knowledge: Fair  Language: Fair  Akathisia:  No  Handed:  Right  AIMS (if indicated): not done  Assets:  Communication Skills Desire for Improvement Housing Social Support Talents/Skills Transportation  ADL's:  Intact  Cognition: WNL  Sleep:  Varies   Screenings: AIMS    Flowsheet Row Video Visit from 03/03/2022 in [facility]  AIMS Total Score 0   AUDIT    Flowsheet Row Office Visit from 10/02/2024 in [facility] Visit from 03/29/2024 in [facility] [place] [facility]  Alcohol Use Disorder Identification Test Final Score (AUDIT) 3  4    GAD-7    Flowsheet Row Office Visit from 10/02/2024 in [facility] [place] Office Visit from 09/10/2024 in [facility] Office Visit from 03/29/2024 in [facility] [place] Office Visit from 09/26/2023 in [facility] Office Visit from 07/07/2023 in [facility] [place] [facility]  Total GAD-7 Score 5 7 7 7 14    PHQ2-9    Flowsheet Row Office Visit from 10/02/2024 in [facility] [place] Office Visit from 09/10/2024 in [facility] Office Visit from 03/29/2024 in [facility] Office Visit from 09/26/2023 in [facility] Office Visit from 07/07/2023 in [facility]  PHQ-2 Total Score 0 0 1 0 2  PHQ-9 Total Score 4 -- 10 -- 9   Flowsheet Row Video Visit from 11/27/2024 in [facility] Video Visit from 09/26/2024 in [facility] Office Visit from 09/10/2024 in [facility]  C-SSRS RISK CATEGORY No Risk No Risk No Risk     Assessment and Plan: NEVIN GRIZZLE is a 44 year old Caucasian male who presented for a follow-up appointment, discussed assessment and plan as noted below.  1. MDD (major depressive disorder), recurrent, in full remission Currently reports overall mood symptoms are stable Continue Wellbutrin  SR 100 mg twice daily Continue CBT with Mr. [name]  2. GAD (generalized anxiety disorder)-improving Ongoing situational anxiety however currently coping better Continue BuSpar  15 mg twice daily Continue Hydroxyzine  25 to 75 mg at bedtime as needed Continue Clonazepam  0.5 mg as  needed for significant anxiety symptoms Reviewed [place] PMP AWARxE Continue CBT  3. Insomnia due to other mental disorder-unstable Although sleep is improved continues to have episodes with restless sleep.  Currently using light therapy during the  day and attributes sleep problems to anxiety as well as the winter months with lack of exercise and exposure to light.  Like to try the higher dose of doxepin  this week.  Otherwise agrees to use Doxepin  10 mg with hydroxyzine  low dosage. Continue Doxepin  10 to 20 mg at bedtime as needed Continue sleep hygiene techniques Will consider referral for sleep study in the future  Follow-up Follow-up in clinic in 2 to 3 months or sooner if needed.     Collaboration of Care: Collaboration of Care: Referral or follow-up with counselor/therapist AEB encouraged to continue psychotherapy sessions.  Patient/Guardian was advised Release of Information must be obtained prior to any record release in order to collaborate their care with an outside provider. Patient/Guardian was advised if they have not already done so to contact the registration department to sign all necessary forms in order for us  to release information regarding their care.   Consent: Patient/Guardian gives verbal consent for treatment and assignment of benefits for services provided during this visit. Patient/Guardian expressed understanding and agreed to proceed.  This note was generated in part or whole with voice recognition software. Voice recognition is usually quite accurate but there are transcription errors that can and very often do occur. I apologize for any typographical errors that were not detected and corrected.     Orel Hord, MD 11/27/2024, 12:23 PM     [1]  Allergies Allergen Reactions   Wellbutrin  [Bupropion ] Other (See Comments)    Anxiety increased on med when taken alone.  He can tolerate wellbutrin  when taken with lexparo.    Erythromycin Diarrhea and Rash

## 2024-12-08 ENCOUNTER — Other Ambulatory Visit: Payer: Self-pay

## 2024-12-08 ENCOUNTER — Emergency Department (HOSPITAL_COMMUNITY)
Admission: EM | Admit: 2024-12-08 | Discharge: 2024-12-09 | Disposition: A | Attending: Emergency Medicine | Admitting: Emergency Medicine

## 2024-12-08 ENCOUNTER — Encounter (HOSPITAL_COMMUNITY): Payer: Self-pay | Admitting: Emergency Medicine

## 2024-12-08 DIAGNOSIS — T7840XA Allergy, unspecified, initial encounter: Secondary | ICD-10-CM | POA: Diagnosis present

## 2024-12-08 LAB — I-STAT CHEM 8, ED
BUN: 10 mg/dL (ref 6–20)
Calcium, Ion: 1.16 mmol/L (ref 1.15–1.40)
Chloride: 101 mmol/L (ref 98–111)
Creatinine, Ser: 0.9 mg/dL (ref 0.61–1.24)
Glucose, Bld: 98 mg/dL (ref 70–99)
HCT: 43 % (ref 39.0–52.0)
Hemoglobin: 14.6 g/dL (ref 13.0–17.0)
Potassium: 3.5 mmol/L (ref 3.5–5.1)
Sodium: 137 mmol/L (ref 135–145)
TCO2: 23 mmol/L (ref 22–32)

## 2024-12-08 MED ORDER — EPINEPHRINE 0.3 MG/0.3ML IJ SOAJ: 0.3000 mg | INTRAMUSCULAR | 0 refills | Status: AC | PRN

## 2024-12-08 MED ORDER — PREDNISONE 20 MG PO TABS
60.0000 mg | ORAL_TABLET | Freq: Once | ORAL | Status: AC
  Administered 2024-12-08: 60 mg via ORAL
  Filled 2024-12-08: qty 3

## 2024-12-08 MED ORDER — DIPHENHYDRAMINE HCL 50 MG/ML IJ SOLN
25.0000 mg | Freq: Once | INTRAMUSCULAR | Status: AC
  Administered 2024-12-08: 25 mg via INTRAMUSCULAR
  Filled 2024-12-08: qty 1

## 2024-12-08 NOTE — ED Triage Notes (Signed)
 BIB [facility] w/ c/o possible allergic reaction after eating black eyed peas. Pt took 3 doses of hydroxyzine  and pepcid  PTA. Pt c/o thorat and tongue swelling that began around 1pm. Went away but started back again. VSS

## 2024-12-08 NOTE — ED Provider Notes (Signed)
 " [place] [facility] DE[facility]MENT AT [facility] Provider Note   CSN: [id number] Arrival date & time: 12/08/24  1954     Patient presents with: Allergic Reaction   Brandon Jackson is a 44 y.o. male.   43 year old male who presents possible reaction.  Patient states that he ate some black-eyed peas as well as wrap from sheets is unsure what could have caused this.  He also uses [employer].  Denies any other allergen exposure.  States he felt this was closing up.  Symptoms have come and gone.  He took 3 doses of hydroxyzine  and Pepcid .  States he did not had any rashes.  No pruritus noted.  States symptoms went away but then came back and now they are gone.  According to EMS, he was asymptomatic when they arrived.  They did not give any treatment       Prior to Admission medications  Medication Sig Start Date End Date Taking? Authorizing Provider  allopurinol  (ZYLOPRIM ) 100 MG tablet Take 1 tablet by mouth twice daily. 01/18/24   Cleatus Arlyss RAMAN, MD  Ascorbic Acid (VITAMIN C PO) Take by mouth.    [provider]  B Complex Vitamins (VITAMIN B COMPLEX PO) Take by mouth.    [provider]  buPROPion  ER (WELLBUTRIN  SR) 100 MG 12 hr tablet Take 1 tablet (100 mg total) by mouth 2 (two) times daily. 09/10/24   Eappen, Saramma, MD  busPIRone  (BUSPAR ) 15 MG tablet TAKE 1 TABLET (15 MG TOTAL) BY MOUTH 2 (TWO) TIMES DAILY. DOSE CHANGE , 10/03/24   Eappen, Saramma, MD  cetirizine  (ZYRTEC ) 10 MG tablet Take 1 tablet (10 mg total) by mouth daily. 10/17/22   Cleatus Arlyss RAMAN, MD  clonazePAM  (KLONOPIN ) 0.5 MG tablet TAKE 1/2 TO 1 TABLET BY MOUTH TWICE A DAY IF NEEDED FOR ANXIETY AND 1/2 TO 1 TABLET AT NIGHT IF NEEDED FOR INSOMNIA, CAUTION SEDATION. 09/21/24   Cleatus Arlyss RAMAN, MD  Cranberry 500 MG CAPS Take by mouth.    [provider]  doxepin  (SINEQUAN ) 10 MG capsule TAKE 1-2 CAPSULES (10-20 MG TOTAL) BY MOUTH AT BEDTIME AS NEEDED. 10/03/24   Eappen,  Saramma, MD  emtricitabine -tenofovir  (TRUVADA) 200-300 MG tablet Take 1 tablet by mouth daily. Please schedule physical prior to more refills 11/07/24   Rilla Baller, MD  famotidine  (PEPCID ) 20 MG tablet Take 1 tablet (20 mg total) by mouth daily as needed for heartburn or indigestion. 10/02/24   Cleatus Arlyss RAMAN, MD  fluticasone  (FLONASE ) 50 MCG/ACT nasal spray Place 1 spray into both nostrils as needed.    [provider]  Lysine 1000 MG TABS  08/23/22   [provider]  meloxicam  (MOBIC ) 15 MG tablet Take 1 tablet (15 mg total) by mouth as needed. 03/29/24   Cleatus Arlyss RAMAN, MD  Multiple Vitamin (MULTIVITAMIN) tablet Take 1 tablet by mouth daily.    [provider]  Omega-3 Fatty Acids (FISH OIL) 300 MG CAPS Take 300 mg by mouth daily.    [provider]  omeprazole  (PRILOSEC) 20 MG capsule Take 1 capsule (20 mg total) by mouth daily. Patient not taking: Reported on 11/19/2024 10/02/24   Cleatus Arlyss RAMAN, MD  predniSONE  (DELTASONE ) 10 MG tablet Take 2 a day for 5 days, then 1 a day for 5 days, with food. Don't take with aleve/ibuprofen/meloxicam . 10/09/24   Cleatus Arlyss RAMAN, MD  tadalafil (CIALIS) 5 MG tablet Take 5 mg by mouth daily. 08/24/23  [provider]  valACYclovir  (VALTREX ) 500 MG tablet TAKE 1 TABLET (500 MG TOTAL) BY MOUTH DAILY. 11/16/23   Cleatus Arlyss RAMAN, MD    Allergies: Wellbutrin  [bupropion ] and Erythromycin    Review of Systems  All other systems reviewed and are negative.   Updated Vital Signs There were no vitals taken for this visit.  Physical Exam Vitals and nursing note reviewed.  Constitutional:      General: He is not in acute distress.    Appearance: Normal appearance. He is well-developed. He is not toxic-appearing.  HENT:     Head: Normocephalic and atraumatic.  Eyes:     General: Lids are normal.     Conjunctiva/sclera: Conjunctivae normal.     Pupils: Pupils are equal, round, and reactive to light.   Neck:     Thyroid: No thyroid mass.     Trachea: No tracheal deviation.  Cardiovascular:     Rate and Rhythm: Normal rate and regular rhythm.     Heart sounds: Normal heart sounds. No murmur heard.    No gallop.  Pulmonary:     Effort: Pulmonary effort is normal. No respiratory distress.     Breath sounds: Normal breath sounds. No stridor. No decreased breath sounds, wheezing, rhonchi or rales.  Abdominal:     General: There is no distension.     Palpations: Abdomen is soft.     Tenderness: There is no abdominal tenderness. There is no rebound.  Musculoskeletal:        General: No tenderness. Normal range of motion.     Cervical back: Normal range of motion and neck supple.  Skin:    General: Skin is warm and dry.     Findings: No abrasion or rash.  Neurological:     Mental Status: He is alert and oriented to person, place, and time. Mental status is at baseline.     GCS: GCS eye subscore is 4. GCS verbal subscore is 5. GCS motor subscore is 6.     Cranial Nerves: No cranial nerve deficit.     Sensory: No sensory deficit.     Motor: Motor function is intact.  Psychiatric:        Attention and Perception: Attention normal.        Speech: Speech normal.        Behavior: Behavior normal.     (all labs ordered are listed, but only abnormal results are displayed) Labs Reviewed - No data to display  EKG: None  Radiology: No results found.   Procedures   Medications Ordered in the ED  predniSONE  (DELTASONE ) tablet 60 mg (has no administration in time range)                                    Medical Decision Making Risk Prescription drug management.   Patient without evidence of severe allergic reaction at this time.  Given Benadryl  here.  Had episodes where he felt his episode was closing however he had no stridor.  Oropharynx was clear.  Do not feel that he needs to have any epinephrine .  Will give patient prescription for EpiPen  return precautions     Final  diagnoses:  None    ED Discharge Orders     None          Dasie Faden, MD 12/08/24 2223  "

## 2024-12-08 NOTE — ED Provider Notes (Signed)
 Care of patient received from prior provider at 11:00 PM, please see their note for complete H/P and care plan.  Received handoff per ED course.  Clinical Course as of 12/08/24 2300  Sat Dec 08, 2024  2300 Allergic Reaction. Conservative management.  Not anaphylaxis.  [CC]    Clinical Course User Index [CC] Jerral Meth, MD    Reassessment: Observed for total of 5 hours.  Well-appearing no acute distress. No further indication for observation in the emergency room.  Patient discharged with supportive care reinforced.     Jerral Meth, MD 12/09/24 [phone]

## 2024-12-10 ENCOUNTER — Encounter: Payer: Self-pay | Admitting: Family Medicine

## 2024-12-11 ENCOUNTER — Ambulatory Visit: Admitting: Psychology

## 2024-12-11 DIAGNOSIS — F3342 Major depressive disorder, recurrent, in full remission: Secondary | ICD-10-CM | POA: Diagnosis not present

## 2024-12-11 DIAGNOSIS — F411 Generalized anxiety disorder: Secondary | ICD-10-CM | POA: Diagnosis not present

## 2024-12-12 ENCOUNTER — Other Ambulatory Visit: Payer: Self-pay | Admitting: Family Medicine

## 2024-12-12 DIAGNOSIS — T7840XA Allergy, unspecified, initial encounter: Secondary | ICD-10-CM

## 2025-01-07 ENCOUNTER — Ambulatory Visit: Admitting: Psychology

## 2025-02-22 ENCOUNTER — Telehealth: Admitting: Psychiatry
